# Patient Record
Sex: Male | Born: 1940 | Race: White | Hispanic: No | Marital: Married | Smoking: Never smoker
Health system: Southern US, Community
[De-identification: ages and names within clinical notes are randomized; demographics above are authoritative.]

## PROBLEM LIST (undated history)

## (undated) DIAGNOSIS — J301 Allergic rhinitis due to pollen: Secondary | ICD-10-CM

## (undated) DIAGNOSIS — E785 Hyperlipidemia, unspecified: Secondary | ICD-10-CM

## (undated) DIAGNOSIS — I4510 Unspecified right bundle-branch block: Secondary | ICD-10-CM

## (undated) DIAGNOSIS — M1990 Unspecified osteoarthritis, unspecified site: Secondary | ICD-10-CM

## (undated) DIAGNOSIS — K219 Gastro-esophageal reflux disease without esophagitis: Secondary | ICD-10-CM

## (undated) DIAGNOSIS — C61 Malignant neoplasm of prostate: Secondary | ICD-10-CM

## (undated) HISTORY — DX: Gastro-esophageal reflux disease without esophagitis: K21.9

## (undated) HISTORY — DX: Malignant neoplasm of prostate: C61

## (undated) HISTORY — PX: ROTATOR CUFF REPAIR: SHX139

## (undated) HISTORY — DX: Allergic rhinitis due to pollen: J30.1

## (undated) HISTORY — PX: TONSILLECTOMY: SUR1361

## (undated) HISTORY — DX: Unspecified right bundle-branch block: I45.10

## (undated) HISTORY — DX: Unspecified osteoarthritis, unspecified site: M19.90

## (undated) HISTORY — PX: APPENDECTOMY: SHX54

## (undated) HISTORY — DX: Hyperlipidemia, unspecified: E78.5

## (undated) HISTORY — PX: HERNIA REPAIR: SHX51

## (undated) HISTORY — PX: CARPAL TUNNEL RELEASE: SHX101

---

## 1996-08-21 HISTORY — PX: OTHER SURGICAL HISTORY: SHX169

## 2000-04-13 ENCOUNTER — Encounter: Payer: Self-pay | Admitting: General Surgery

## 2000-04-18 ENCOUNTER — Ambulatory Visit (HOSPITAL_COMMUNITY): Admission: RE | Admit: 2000-04-18 | Discharge: 2000-04-18 | Payer: Self-pay | Admitting: General Surgery

## 2002-07-23 ENCOUNTER — Ambulatory Visit (HOSPITAL_BASED_OUTPATIENT_CLINIC_OR_DEPARTMENT_OTHER): Admission: RE | Admit: 2002-07-23 | Discharge: 2002-07-23 | Payer: Self-pay | Admitting: Orthopedic Surgery

## 2003-08-10 ENCOUNTER — Ambulatory Visit (HOSPITAL_BASED_OUTPATIENT_CLINIC_OR_DEPARTMENT_OTHER): Admission: RE | Admit: 2003-08-10 | Discharge: 2003-08-10 | Payer: Self-pay | Admitting: Orthopedic Surgery

## 2005-08-21 HISTORY — PX: TOTAL HIP ARTHROPLASTY: SHX124

## 2005-10-04 ENCOUNTER — Inpatient Hospital Stay (HOSPITAL_COMMUNITY): Admission: RE | Admit: 2005-10-04 | Discharge: 2005-10-08 | Payer: Self-pay | Admitting: Orthopedic Surgery

## 2005-10-11 ENCOUNTER — Encounter: Admission: RE | Admit: 2005-10-11 | Discharge: 2005-10-11 | Payer: Self-pay | Admitting: Orthopedic Surgery

## 2009-10-19 LAB — CONVERTED CEMR LAB: PSA: 0.01 ng/mL

## 2010-04-28 ENCOUNTER — Ambulatory Visit: Payer: Self-pay | Admitting: Family Medicine

## 2010-04-28 DIAGNOSIS — E785 Hyperlipidemia, unspecified: Secondary | ICD-10-CM | POA: Insufficient documentation

## 2010-04-28 DIAGNOSIS — N5231 Erectile dysfunction following radical prostatectomy: Secondary | ICD-10-CM | POA: Insufficient documentation

## 2010-04-28 DIAGNOSIS — K219 Gastro-esophageal reflux disease without esophagitis: Secondary | ICD-10-CM

## 2010-04-28 DIAGNOSIS — J309 Allergic rhinitis, unspecified: Secondary | ICD-10-CM | POA: Insufficient documentation

## 2010-04-28 LAB — CONVERTED CEMR LAB

## 2010-04-29 LAB — CONVERTED CEMR LAB
ALT: 24 units/L (ref 0–53)
AST: 24 units/L (ref 0–37)
Albumin: 4.4 g/dL (ref 3.5–5.2)
Alkaline Phosphatase: 60 units/L (ref 39–117)
BUN: 16 mg/dL (ref 6–23)
Basophils Absolute: 0 10*3/uL (ref 0.0–0.1)
Basophils Relative: 0.3 % (ref 0.0–3.0)
Bilirubin, Direct: 0.2 mg/dL (ref 0.0–0.3)
CO2: 28 meq/L (ref 19–32)
Calcium: 9.4 mg/dL (ref 8.4–10.5)
Chloride: 103 meq/L (ref 96–112)
Cholesterol: 209 mg/dL — ABNORMAL HIGH (ref 0–200)
Creatinine, Ser: 1.1 mg/dL (ref 0.4–1.5)
Direct LDL: 160.7 mg/dL
Eosinophils Absolute: 0.1 10*3/uL (ref 0.0–0.7)
Eosinophils Relative: 1.1 % (ref 0.0–5.0)
GFR calc non Af Amer: 74.85 mL/min (ref >60)
Glucose, Bld: 86 mg/dL (ref 70–99)
HCT: 44.8 % (ref 39.0–52.0)
HDL: 35.7 mg/dL — ABNORMAL LOW (ref >39.00)
Hemoglobin: 15.6 g/dL (ref 13.0–17.0)
Lymphocytes Relative: 16.3 % (ref 12.0–46.0)
Lymphs Abs: 0.9 10*3/uL (ref 0.7–4.0)
MCHC: 34.8 g/dL (ref 30.0–36.0)
MCV: 90.6 fL (ref 78.0–100.0)
Monocytes Absolute: 0.5 10*3/uL (ref 0.1–1.0)
Monocytes Relative: 9.1 % (ref 3.0–12.0)
Neutro Abs: 4.2 10*3/uL (ref 1.4–7.7)
Neutrophils Relative %: 73.2 % (ref 43.0–77.0)
Platelets: 180 10*3/uL (ref 150.0–400.0)
Potassium: 4.8 meq/L (ref 3.5–5.1)
RBC: 4.94 M/uL (ref 4.22–5.81)
RDW: 14.2 % (ref 11.5–14.6)
Sodium: 140 meq/L (ref 135–145)
Total Bilirubin: 0.9 mg/dL (ref 0.3–1.2)
Total CHOL/HDL Ratio: 6
Total Protein: 7 g/dL (ref 6.0–8.3)
Triglycerides: 87 mg/dL (ref 0.0–149.0)
VLDL: 17.4 mg/dL (ref 0.0–40.0)
WBC: 5.8 10*3/uL (ref 4.5–10.5)

## 2010-07-21 ENCOUNTER — Emergency Department (HOSPITAL_COMMUNITY)
Admission: EM | Admit: 2010-07-21 | Discharge: 2010-07-21 | Payer: Self-pay | Source: Home / Self Care | Admitting: Emergency Medicine

## 2010-08-02 LAB — CONVERTED CEMR LAB
ALT: 34 units/L (ref 0–53)
AST: 27 units/L (ref 0–37)
Cholesterol: 232 mg/dL — ABNORMAL HIGH (ref 0–200)
Direct LDL: 162.7 mg/dL
HDL: 36.4 mg/dL — ABNORMAL LOW (ref >39.00)
Total CHOL/HDL Ratio: 6
Triglycerides: 154 mg/dL — ABNORMAL HIGH (ref 0.0–149.0)
VLDL: 30.8 mg/dL (ref 0.0–40.0)

## 2010-08-03 ENCOUNTER — Ambulatory Visit: Payer: Self-pay | Admitting: Family Medicine

## 2010-08-21 HISTORY — PX: KNEE ARTHROSCOPY: SHX127
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
## 2010-09-22 NOTE — Letter (Signed)
Summary: Patient Questionnaire  Patient Questionnaire   Imported By: Beau Fanny 04/29/2010 10:11:14  _____________________________________________________________________  External Attachment:    Type:   Image     Comment:   External Document

## 2010-09-30 ENCOUNTER — Encounter (INDEPENDENT_AMBULATORY_CARE_PROVIDER_SITE_OTHER): Payer: Medicare Other | Admitting: Family Medicine

## 2010-09-30 ENCOUNTER — Encounter: Payer: Self-pay | Admitting: Family Medicine

## 2010-09-30 DIAGNOSIS — R0789 Other chest pain: Secondary | ICD-10-CM

## 2010-09-30 DIAGNOSIS — I440 Atrioventricular block, first degree: Secondary | ICD-10-CM | POA: Insufficient documentation

## 2010-09-30 DIAGNOSIS — K219 Gastro-esophageal reflux disease without esophagitis: Secondary | ICD-10-CM
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
## 2010-10-07 ENCOUNTER — Ambulatory Visit: Payer: Medicare Other | Admitting: Cardiology

## 2010-10-24 ENCOUNTER — Ambulatory Visit: Payer: Medicare Other | Admitting: Cardiology

## 2010-11-04 ENCOUNTER — Other Ambulatory Visit (HOSPITAL_COMMUNITY): Payer: Self-pay | Admitting: Orthopedic Surgery

## 2010-11-04 ENCOUNTER — Encounter (INDEPENDENT_AMBULATORY_CARE_PROVIDER_SITE_OTHER): Payer: Self-pay | Admitting: *Deleted

## 2010-11-04 DIAGNOSIS — T84030A Mechanical loosening of internal right hip prosthetic joint, initial encounter: Secondary | ICD-10-CM

## 2010-11-08 ENCOUNTER — Telehealth: Payer: Self-pay | Admitting: *Deleted

## 2010-11-08 NOTE — Letter (Signed)
Summary: Appointment - Missed  [facility], Main Office  [address]   [place], [place] [address]   Phone: [phone]  Fax: [phone]     November 04, 2010 MRN: [id number]   [name] [address] [facility] [place], [place]  [address]   Dear Mr. Tucholski,  Our records indicate you missed your appointment on   10-24-2010 with Dr. Daleen Squibb.                                   It is very important that we reach you to reschedule this appointment. We look forward to participating in your health care needs. Please contact us at the number listed above at your earliest convenience to reschedule this appointment.     Sincerely,   Lorne Skeens  [facility] Scheduling Team

## 2010-11-08 NOTE — Telephone Encounter (Signed)
Pt would like zostavax vaccine, please send order to [facility].

## 2010-11-08 NOTE — Telephone Encounter (Signed)
Faxed to [facility] pharmacy

## 2010-11-08 NOTE — Telephone Encounter (Signed)
Prescription in outbox

## 2010-11-10 ENCOUNTER — Ambulatory Visit (HOSPITAL_COMMUNITY)
Admission: RE | Admit: 2010-11-10 | Discharge: 2010-11-10 | Disposition: A | Discharge status: Home / Self Care | Payer: Medicare Other | Source: Ambulatory Visit | Attending: Orthopedic Surgery | Admitting: Orthopedic Surgery

## 2010-11-10 ENCOUNTER — Other Ambulatory Visit (HOSPITAL_COMMUNITY): Payer: Self-pay | Admitting: Orthopedic Surgery

## 2010-11-10 DIAGNOSIS — Z9889 Other specified postprocedural states: Secondary | ICD-10-CM

## 2010-11-10 DIAGNOSIS — Z0189 Encounter for other specified special examinations: Secondary | ICD-10-CM | POA: Insufficient documentation

## 2010-11-10 DIAGNOSIS — M25559 Pain in unspecified hip: Secondary | ICD-10-CM | POA: Insufficient documentation

## 2010-11-10 DIAGNOSIS — K4090 Unilateral inguinal hernia, without obstruction or gangrene, not specified as recurrent: Secondary | ICD-10-CM | POA: Insufficient documentation

## 2010-11-10 DIAGNOSIS — T84030A Mechanical loosening of internal right hip prosthetic joint, initial encounter: Secondary | ICD-10-CM

## 2011-01-06 NOTE — H&P (Signed)
[place]  Patient:    Scott [name], Scott [name]                       MRN: [id number] Adm. Date:  04/18/00 Attending:  Jimmye Norman, M.D.                         History and Physical  DATE OF BIRTH:  [birth year]  SOCIAL SECURITY NUMBER:  [id number]  IDENTIFICATION AND CHIEF COMPLAINT:  The patient is a 70 year old gentleman with a left inguinal hernia who comes in for repair.  HISTORY OF PRESENT ILLNESS:  The patient has had discomfort in his groin for at least the past year.  However, he did not seek medical attention until he developed a bulge which was recently.  He is status post radical prostatectomy for prostate cancer.  However, this is unrelated to that procedure.  PAST MEDICAL HISTORY:  He is otherwise healthy with no real past medical history.  FAMILY HISTORY:  He has a father who died of prostate cancer and the mother died of heart disease.  His brothers and sisters are alive, but both brothers have prostate cancer.  MEDICATIONS:  His only medication includes a stomach pill so to speak for reflux, probably Prevacid or Pravachol.  ALLERGIES:  No known drug allergies.  PHYSICAL EXAMINATION:  GENERAL:  He is well-nourished and well-developed in no acute distress.  IMPRESSION:  He has a large left inguinal hernia without a right inguinal hernia noticeable.  Examination otherwise unremarkable.  PLAN:  Left inguinal hernia repair after preoperative antibiotic.  This will be done as an outpatient very likely unless there are complications of the procedure.  The risks and benefits have been explained to the patient and he wishes to proceed. DD:  04/18/00 TD:  04/18/00 Job: [id number]

## 2011-01-06 NOTE — Op Note (Signed)
NAME:  Scott [name], Scott [name]                        ACCOUNT NO.:  [id number]   MEDICAL RECORD NO.:  [id number]                   PATIENT TYPE:  AMB   LOCATION:  DSC                                  FACILITY:  [facility]   PHYSICIAN:  Feliberto Gottron. Turner Daniels, M.D.                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   PROCEDURE:  Left shoulder arthroscopic anterior inferior acromioplasty,  distal clavicle excision and followed by intra-articular debridement of  chondromalacia of the humeral head and glenoid.   SURGEON:  Feliberto Gottron. Turner Daniels, M.D.   </FIRST ASSISTANT>  Arlys John D. Petrarca, P.A.-C.   ANESTHESIA:  Interscalene block with general endotracheal anesthesia.   ESTIMATED BLOOD LOSS:  Minimal.   FLUIDS REPLACED:  800 mL of crystalloid.   DRAINS PLACED:  None.   TOURNIQUET TIME:  None.   INDICATIONS FOR PROCEDURE:  The patient is a 70 year old gentleman whose  hobbies include weight training.  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  He has a subacromial spur on x-ray and desires elective  decompression of his left shoulder.  He has failed conservative treatment  with anti-inflammatory medications, extensive physical therapy and got good  temporary relief from a cortisone injection.   DESCRIPTION OF PROCEDURE:  The patient was identified by his arm band and  taken to the operating room at the [facility].   Appropriate  anesthetic monitors were attached and interscalene block anesthesia was  induced into the left upper extremity followed by general endotracheal  anesthesia.  The left upper extremity was then prepped and draped in the  usual sterile fashion from the wrist to the hemithorax after placing him in  the beach chair position.  Using a #11 blade, standard portals were then  made 1.5 cm anterior to the [facility] joint, lateral to the junction of the middle  and posterior thirds of the acromion and posterior to the posterolateral  corner of the acromion process.  The In-Flow was placed anteriorly, the  arthroscope laterally and a 4.2 great white sucker shaver posteriorly where  diagnostic arthroscopy revealed a hypertrophic AC joint denuded of  cartilage, a subacromial spur and a relatively intact rotator cuff with no  significant tearing.  Using a 4.5 hooded Vortex bur, we then removed the  subacromial spur and the inferior distal cm of the clavicle partially  debriding  the [facility] joint.  We then brought the 4.5 Vortex bur in anteriorly,  the In-Flow laterally and the scope posteriorly completing a distal clavicle  excision and smoothing off the end of the clavicle.  Satisfied with the  decompression, the shoulder was then washed out with normal saline solution  and with the In-Flow attached to the scope, the scope was repositioned into  the glenohumeral joint using the posterior portal where diagnostic  arthroscopy revealed grade 4 focal chondromalacia of the humeral head which  was debrided as well as removing small bits of articular cartilage found  floating in the joint fluid.  Minimal glenoid tearing was also removed and  grade 3 chondromalacia of the glenoid articular cartilage was also debrided.  The shoulder was once again washed out with normal saline solution via the  scope.  The instruments were then removed and a dressing of Xeroform, 4 x 4  dressing sponges and paper tape applied  followed by a sling.  The patient  was awakened and taken to the recovery room without difficulty.                                               Feliberto Gottron. Turner Daniels, M.D.    [name]  D:  08/10/2003  T:  08/10/2003  Job:  [id number]

## 2011-01-06 NOTE — Op Note (Signed)
NAME:  NICHOLOS, ALOISI                        ACCOUNT NO.:  [id number]   MEDICAL RECORD NO.:  [id number]                   PATIENT TYPE:  AMB   LOCATION:  DSC                                  FACILITY:  [facility]   PHYSICIAN:  Feliberto Gottron. Turner Daniels, M.D.                DATE OF BIRTH:  [birth year]   DATE OF PROCEDURE:  DATE OF DISCHARGE:                                 OPERATIVE REPORT   NO DICTATION.                                               Feliberto Gottron. Turner Daniels, M.D.    [name]  D:  08/10/2003  T:  08/11/2003  Job:  [id number]

## 2011-01-06 NOTE — Op Note (Signed)
[place]  Patient:    Scott [name], Scott [name]                     MRN: [id number] Proc. Date: 04/18/00 Adm. Date:  [id number] Disc. Date: [id number] Attending:  Cherylynn Ridges                           Operative Report  PREOPERATIVE DIAGNOSIS:  Left inguinal hernia.  POSTOPERATIVE DIAGNOSIS:  Left indirect inguinal hernia with small direct weakness.  OPERATION PERFORMED:  Left inguinal herniorrhaphy with Marlex mesh.  SURGEON:  Jimmye Norman, M.D.  ASSISTANT:  None.  ANESTHESIA:  General with a laryngeal airway.  ESTIMATED BLOOD LOSS:  50 cc.  COMPLICATIONS:  None.  CONDITION:  Stable.  INDICATIONS FOR PROCEDURE:  The patient is a 70 year old gentleman status post a radical prostatectomy, who comes in with a left inguinal bulge.  He was checked for a right inguinal hernia at the office visit, did not appear to have one, although he does complain of some mild tenderness on that side, he does not have an obvious hernia.  FINDINGS:  The patient had an indirect hernia associated with a gubernaculum which was ligated.  The vas deferens was normal and anatomy otherwise within normal limits.  He did have mild weakness of the direct floor for which we placed a Marlex mesh.  DESCRIPTION OF PROCEDURE:  The patient was taken to the operating room and placed on the table in supine position.  After an adequate laryngeal airway anesthetic was administered, he was prepped and draped in the usual sterile manner exposing the left groin.  A 5 cm long transverse curvilinear incision was made at the level of the superficial ring, this taken down through the subcutaneous down through the external oblique fascia.  With a Weitlaner retractor in place, we opened the fascia down through the superficial ring and then isolated the spermatic cord around a Penrose.  We were able to dissect off part of the cremasteric muscle, isolating it from the floor of the inguinal  canal.  We were able to dissect out the hernia sac from the rest of the spermatic cord and also preserve the vas deferens.  There was a gubernaculum associated with the hernia sac which was ligated with a Kelly clamp and 2 and 3-0 Vicryl tie.  Again this was a gubernaculum, was not spermatic cord.  We checked them both before and after ligation and the spermatic cord was completely intact.  We resected the sac down to its base and then ligated it doubly with a 0 suture ligature of Ethibone.  Once this was done, it retracted back into the perperitoneal area and then we repaired the floor using a running 0 Prolene attached to a piece of Marlex mesh attached anteromedially to the conjoined tendon and inferolaterally to the reflected portion of the inguinal ligament.  All needle counts were correct.  At that point, we irrigated with antibiotic solution as we soaked the Marlex in prior to implantation.  Once we irrigated, we placed the cord back into the canal.  We repaired the external oblique fascia on top of the cord using a running 3-0 Vicryl.  Scarpas fascia was reapproximated using interrupted sutures of 3-0 Vicryl.  0.25% Marcaine was injected into the incision using syringe and a 25 gauge needle.  Then we closed the skin using a running subcuticular stitch of 5-0 Vicryl.  All  sponge, needle and instrument counts were correct.  A sterile dressing was applied. DD:  04/18/00 TD:  04/19/00 Job: [id number]
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
## 2011-01-06 NOTE — Op Note (Signed)
NAME:  MUAD, NOGA                        ACCOUNT NO.:  [id number]   MEDICAL RECORD NO.:  [id number]                   PATIENT TYPE:  AMB   LOCATION:  DSC                                  FACILITY:  [facility]   PHYSICIAN:  Feliberto Gottron. Turner Daniels, M.D.                DATE OF BIRTH:  [birth year]   DATE OF PROCEDURE:  07/23/2002  DATE OF DISCHARGE:                                 OPERATIVE REPORT   PREOPERATIVE DIAGNOSES:  Right shoulder impingement with supraspinatus  rotator cuff tear about a month old.   POSTOPERATIVE DIAGNOSES:  Right shoulder impingement with supraspinatus  rotator cuff tear about a month old.   OPERATION PERFORMED:  Right shoulder arthroscopic anterior inferior  acromioplasty, co-plane of distal clavicular spur followed by mini open  rotator cuff repair using three of the 4 mm Revo screws.   SURGEON:  Feliberto Gottron. Turner Daniels, M.D.   ASSISTANTLaural Benes. Jannet Mantis.   ANESTHESIA:  Interscalene block plus general endotracheal.   ESTIMATED BLOOD LOSS:  Minimal.   FLUIDS REPLACED:  1L of crystalloid.   DRAINS:  None.   TOURNIQUET TIME:  None.   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  MRI scan proved the rotator cuff rupture.  He is taken for  arthroscopic decompression with removal of 1 cm type 3 subacromial spur, a  subclavicular spur followed by an anticipated mini open rotator cuff repair  using the Revo screw technique.  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  He is an avid weight trainer and  is adamant about having his cuff repaired.   DESCRIPTION OF PROCEDURE:  The patient was identified by arm band and taken  to the operating room at [facility] where appropriate  anesthetic monitors are attached and interscalene block anesthesia induced  to the right upper extremity followed by  general endotracheal anesthesia.  The patient was placed in the beach chair position.  The right upper  extremity was prepped and draped in the usual sterile fashion from the wrist  to the hemithorax.  Using a #11 blade, standard anterolateral and posterior  portals were made 1.5 cm anterior to the acromioclavicular joint, lateral to  the junction of the posterior and middle thirds of the acromion, posterior  to the posterolateral corner of the acromion process.  The inflow was placed  anteriorly, the arthroscope laterally, and a 4.2 great white sucker shaver  posteriorly.  We performed subacromial bursectomy, identified subclavicular  and subacromial spurs as well as a full thickness supraspinatus cuff tear  retracted about 1.5 cm.  The patient had a significant amount of oozing from  the soft tissues.  Underwater electrocautery was used to control this and  then a 4.5  mm hooded Vortex bur was used to perform a distal clavicle  inferior spur excision and anterior inferior acromioplasty.  We then  carefully examined the rotator cuff that remained.  It was good quality  tissue at least 1/4 inch thick and at this point we converted to a mini open  technique making a 4 cm incision starting at the anterolateral corner of the  acromion and going distally through the skin and subcutaneous tissue.  The  deltoid muscle was split in line with the skin incision along the  anterolateral raphe allowing [place] to enter the subdeltoid and subacromial  space.  We immediately identified the rotator cuff tear as well as the  anterior tubercle of the greater tuberosity where we placed three 4 mm Revo  screws with a #2 Ethibond suture.  Using a free needle, this was then passed  through the supraspinatus cuff obtaining good purchase on the tissue and  then in order, the sutures were tied, performing a rotator cuff repair and  using a minimum of 6 knots in the suture to assure good firm fixation.  The  wound was  then washed out with normal saline.  The deltoid interval  anterolateral raphe was closed with running 2-0 Vicryl suture, the  subcutaneous tissue with 2-0 Vicryl suture and the skin with running  interlocking 3-0 nylon suture.  A dressing of Xeroform, 4 x 4 dressing  sponges and HypaFix tape applied followed by a shoulder immobilizer.  Patient awakened and taken to the recovery room without difficulty.                                                  Feliberto Gottron. Turner Daniels, M.D.    [name]  D:  07/23/2002  T:  07/23/2002  Job:  [id number]

## 2011-06-12 ENCOUNTER — Encounter: Payer: Self-pay | Admitting: Family Medicine

## 2011-06-13 ENCOUNTER — Encounter: Payer: Self-pay | Admitting: Family Medicine

## 2011-06-13 ENCOUNTER — Ambulatory Visit (INDEPENDENT_AMBULATORY_CARE_PROVIDER_SITE_OTHER): Payer: Medicare Other | Admitting: Family Medicine

## 2011-06-13 VITALS — BP 130/80 | HR 75 | Temp 97.6°F | Ht 69.5 in | Wt 176.8 lb

## 2011-06-13 DIAGNOSIS — Z23 Encounter for immunization: Secondary | ICD-10-CM

## 2011-06-13 DIAGNOSIS — B356 Tinea cruris: Secondary | ICD-10-CM

## 2011-06-13 MED ORDER — TERBINAFINE HCL 1 % EX CREA: TOPICAL_CREAM | Freq: Two times a day (BID) | CUTANEOUS | Status: DC

## 2011-06-13 NOTE — Progress Notes (Signed)
  Subjective:    Patient ID: Scott [name], male    DOB: Sep 19, 1940, 70 y.o.   MRN: [id number]  HPI  Scott [name], a 70 y.o. male presents today in the office for the following:    Hydrocortisone 1% and - Iodoquinol 1%  Rash off and on in groin, has used above. Sometimes help / sometimes not.  The PMH, PSH, Social History, Family History, Medications, and allergies have been reviewed in [facility], and have been updated if relevant.   Review of Systems ROS: GEN: No acute illnesses, no fevers, chills. Interactive and getting along well at home.  Otherwise, ROS is as per the HPI.     Objective:   Physical Exam   Physical Exam  Blood pressure 130/80, pulse 75, temperature 97.6 F (36.4 C), temperature source Oral, height 5' 9.5" (1.765 m), weight 176 lb 12.8 oz (80.196 kg), SpO2 97.00%.  GEN: WDWN, NAD, Non-toxic, A & O x 3 HEENT: Atraumatic, Normocephalic. Neck supple. No masses, No LAD. Ears and Nose: No external deformity. EXTR: No c/c/e NEURO Normal gait.  PSYCH: Normally interactive. Conversant. Not depressed or anxious appearing.  Calm demeanor.  Skin: elevated rash with borders, some irritation, redness      Assessment & Plan:   1. Jock itch  terbinafine (LAMISIL) 1 % cream  2. Flu vaccine need  Flu vaccine greater than or equal to 3yo preservative free IM    Classic appearance

## 2011-06-13 NOTE — Patient Instructions (Signed)
Apply twice a day -- may take up to 6 weeks

## 2011-11-13 ENCOUNTER — Ambulatory Visit (INDEPENDENT_AMBULATORY_CARE_PROVIDER_SITE_OTHER): Payer: Medicare Other | Admitting: Family Medicine

## 2011-11-13 ENCOUNTER — Encounter: Payer: Self-pay | Admitting: Family Medicine

## 2011-11-13 DIAGNOSIS — N400 Enlarged prostate without lower urinary tract symptoms: Secondary | ICD-10-CM | POA: Diagnosis not present

## 2011-11-13 DIAGNOSIS — Z79899 Other long term (current) drug therapy: Secondary | ICD-10-CM

## 2011-11-13 DIAGNOSIS — R000 Tachycardia, unspecified: Secondary | ICD-10-CM

## 2011-11-13 DIAGNOSIS — E785 Hyperlipidemia, unspecified: Secondary | ICD-10-CM | POA: Diagnosis not present

## 2011-11-13 DIAGNOSIS — R079 Chest pain, unspecified: Secondary | ICD-10-CM | POA: Diagnosis not present

## 2011-11-13 LAB — CBC WITH DIFFERENTIAL/PLATELET
Basophils Relative: 0 % (ref 0.0–3.0)
Eosinophils Absolute: 0 10*3/uL (ref 0.0–0.7)
Eosinophils Relative: 0.2 % (ref 0.0–5.0)
Hemoglobin: 15.5 g/dL (ref 13.0–17.0)
Lymphs Abs: 0.2 10*3/uL — ABNORMAL LOW (ref 0.7–4.0)
Monocytes Absolute: 0.3 10*3/uL (ref 0.1–1.0)
Neutro Abs: 6.2 10*3/uL (ref 1.4–7.7)
Neutrophils Relative %: 91.5 % — ABNORMAL HIGH (ref 43.0–77.0)
RDW: 14.2 % (ref 11.5–14.6)
WBC: 6.8 10*3/uL (ref 4.5–10.5)

## 2011-11-13 LAB — BASIC METABOLIC PANEL
BUN: 23 mg/dL (ref 6–23)
Calcium: 9.2 mg/dL (ref 8.4–10.5)
GFR: 82.62 mL/min (ref >60.00)
Glucose, Bld: 114 mg/dL — ABNORMAL HIGH (ref 70–99)
Sodium: 141 mEq/L (ref 135–145)

## 2011-11-13 LAB — LIPID PANEL
Cholesterol: 185 mg/dL (ref 0–200)
LDL Cholesterol: 131 mg/dL — ABNORMAL HIGH (ref 0–99)
Total CHOL/HDL Ratio: 5
Triglycerides: 88 mg/dL (ref 0.0–149.0)
VLDL: 17.6 mg/dL (ref 0.0–40.0)

## 2011-11-13 LAB — HEPATIC FUNCTION PANEL
ALT: 25 U/L (ref 0–53)
Bilirubin, Direct: 0.1 mg/dL (ref 0.0–0.3)
Total Bilirubin: 0.7 mg/dL (ref 0.3–1.2)

## 2011-11-13 NOTE — Progress Notes (Signed)
Patient Name: Scott [name] Date of Birth: 1940-11-16 Medical Record Number: [id number] Gender: male Date of Encounter: 11/13/2011  History of Present Illness:  Scott [name] is a 71 y.o. very pleasant male patient who presents with the following:  Chest pain -- ate some popcorn, has not been eating since. Has been up all night and took 2 excedrin. Patient began to get some chest pain and tightness while he was eating some popcorn last night. Since that time he has had a belching acidic taste in his mouth and continued to have some mild discomfort in his chest, but that has improved significantly compared to last night.  Feels like the food up in the chest, tightness in the chest and everything -- 7:30 PM. No nausea.  Left shoulder pain, pain has been there for about a month. He has some occasional hand tingling, but that has been ongoing for greater than a month About a month with some tingling in his fingers.  Lab Results  Component Value Date   CHOL 185 11/13/2011   CHOL 232* 08/02/2010   CHOL 209* 04/28/2010   Lab Results  Component Value Date   HDL 36.60* 11/13/2011   HDL 36.40* 08/02/2010   HDL 35.70* 04/28/2010   Lab Results  Component Value Date   LDLCALC 131* 11/13/2011   Lab Results  Component Value Date   TRIG 88.0 11/13/2011   TRIG 154.0* 08/02/2010   TRIG 87.0 04/28/2010   Lab Results  Component Value Date   CHOLHDL 5 11/13/2011   CHOLHDL 6 08/02/2010   CHOLHDL 6 04/28/2010   Lab Results  Component Value Date   LDLDIRECT 162.7 08/02/2010   LDLDIRECT 160.7 04/28/2010   Cardiac risk factors: No Tob, ETOH, Drugs Previously with elevated Chol Elevated BP today without dx of HTN Father, CAD, MI died in 24's Mother, died in 15's of MI  Had ETT but > 10 years ago  The patient is physically active and works out. He does describe having tachycardia greater than 200 after less than 5 minutes of exercise  Patient Active Problem List  Diagnoses  . HYPERLIPIDEMIA   . ALLERGIC RHINITIS  . GERD  . ORGANIC IMPOTENCE  . AV BLOCK, 1ST DEGREE  . CHEST PAIN, ATYPICAL   Past Medical History  Diagnosis Date  . Allergy   . GERD (gastroesophageal reflux disease)   . Hyperlipidemia   . Arthritis   . Prostate cancer     cancer   Past Surgical History  Procedure Date  . Hernia repair   . Prostatectomy   . Appendectomy   . Tonsillectomy   . Total hip arthroplasty     right   . Rotator cuff repair     right   History  Substance Use Topics  . Smoking status: Never Smoker   . Smokeless tobacco: Not on file  . Alcohol Use: No   Family History  Problem Relation Age of Onset  . Cancer Father     prostate  . Cancer Brother     prostate  . Cancer Brother     prostate   No Known Allergies  Medication list has been reviewed and updated.  Review of Systems: As above. No shortness of breath. No syncope. occ tachycardia  Physical Examination: Filed Vitals:   11/13/11 1232  BP: 140/70  Pulse: 95  Temp: 97.9 F (36.6 C)  TempSrc: Oral  Height: 5' 9.5" (1.765 m)  Weight: 181 lb 1.9 oz (82.155 kg)  SpO2: 96%  Body mass index is 26.36 kg/(m^2).   GEN: WDWN, NAD, Non-toxic, A & O x 3 HEENT: Atraumatic, Normocephalic. Neck supple. No masses, No LAD. Ears and Nose: No external deformity. CV: RRR, pulse 100. No M/G/R. No JVD. No thrill. No extra heart sounds. PULM: CTA B, no wheezes, crackles, rhonchi. No retractions. No resp. distress. No accessory muscle use. EXTR: No c/c/e NEURO Normal gait.  PSYCH: Normally interactive. Conversant. Not depressed or anxious appearing.  Calm demeanor.    Assessment and Plan:  1. Chest pain  EKG 12-Lead, Ambulatory referral to Cardiology, Basic metabolic panel, CBC with Differential  2. Tachycardia  Ambulatory referral to Cardiology, Basic metabolic panel, CBC with Differential  3. Hyperlipidemia  Hepatic function panel, Lipid panel  4. Encounter for long-term (current) use of other medications   CBC with Differential, Hepatic function panel  5. Enlarged prostate  PSA   EKG: NSR. Pulse 95. RBBB. 1st degree AV block. These are not new and are apparent on a prior electrocardiogram from August, 2012. The patient also has some prior flipped T waves on some inferior leads, which are stable compared to his prior electrocardiogram in August of 2012. No ST depression or elevation. Inverted T. On v2, different from prior.  Historically, this sounds more to be reflux and GI induced. I did have him do some Maalox 4 times daily and continue his protonix.  Given his electrocardiogram findings, chest pain and risk factors of family history and prior elevated cholesterols, and a history of tachycardia greater than 200 with minimal exertion, we'll consult cardiology for their opinion. I appreciate their assistance.  Orders Today: Orders Placed This Encounter  Procedures  . Basic metabolic panel  . CBC with Differential  . Hepatic function panel  . Lipid panel  . PSA  . Ambulatory referral to Cardiology    Referral Priority:  Routine    Referral Type:  Consultation    Referral Reason:  Specialty Services Required    Requested Specialty:  Cardiology    Number of Visits Requested:  1  . EKG 12-Lead

## 2011-11-13 NOTE — Patient Instructions (Signed)
REFERRAL: GO THE THE FRONT ROOM AT THE ENTRANCE OF OUR CLINIC, NEAR CHECK IN. ASK FOR Scott [name]. SHE WILL HELP YOU SET UP YOUR REFERRAL. DATE: TIME:  

## 2011-11-21 ENCOUNTER — Ambulatory Visit (INDEPENDENT_AMBULATORY_CARE_PROVIDER_SITE_OTHER): Payer: Medicare Other | Admitting: Cardiovascular Disease

## 2011-11-21 ENCOUNTER — Encounter: Payer: Self-pay | Admitting: Cardiovascular Disease

## 2011-11-21 DIAGNOSIS — R000 Tachycardia, unspecified: Secondary | ICD-10-CM

## 2011-11-21 DIAGNOSIS — R0789 Other chest pain: Secondary | ICD-10-CM | POA: Diagnosis not present

## 2011-11-21 DIAGNOSIS — R079 Chest pain, unspecified: Secondary | ICD-10-CM | POA: Diagnosis not present

## 2011-11-21 NOTE — Progress Notes (Signed)
HPI  Scott [name] is a pleasant 71 year old male who is referred by Dr. Patsy Lager for evaluation of recent chest pain and an abnormal ECG. The patient is not aware of any previous cardiac history. His only risk factors for coronary artery disease include age and hyperlipidemia. He is physically very active and exercises on a regular basis mostly weight lifting with occasional cardiac aerobic exercise. 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 He had an episode of chest pain about one week ago after he ate popcorn. It was described as an aching sensation with some heartburn. It happened at rest and did not worsen with physical activities. He had no dyspnea associated with that. He has been able to do all his exercise without limitations. However, he mentions that when he exercises on the elliptical his heart rate goes as high as 190-200 beats per minute. That's usually after at least 30-45 minutes of intense exercise. At that workload, he denies any chest pain but he does get dyspnea. He has not had any tachycardic episode when he is not exercising.   No Known Allergies   Current Outpatient Prescriptions on File Prior to Visit  Medication Sig Dispense Refill  . aspirin 81 MG tablet Take 81 mg by mouth daily.        . celecoxib (CELEBREX) 200 MG capsule Take 200 mg by mouth daily.        . pantoprazole (PROTONIX) 40 MG tablet Take 40 mg by mouth daily.        . Red Yeast Rice 600 MG CAPS Take 1 capsule by mouth daily.        . tadalafil (CIALIS) 20 MG tablet Take 20 mg by mouth daily as needed.        . terbinafine (LAMISIL) 1 % cream Apply topically 2 (two) times daily.  30 g  2  . vitamin B-12 (CYANOCOBALAMIN) 1000 MCG tablet Take 1,000 mcg by mouth daily.           Past Medical History  Diagnosis Date  . Allergy   . GERD (gastroesophageal reflux disease)   . Hyperlipidemia   . Arthritis   . Prostate cancer     cancer     Past Surgical History  Procedure Date  . Hernia  repair   . Prostatectomy   . Appendectomy   . Tonsillectomy   . Total hip arthroplasty     right   . Rotator cuff repair     right     Family History  Problem Relation Age of Onset  . Cancer Father     prostate  . Cancer Brother     prostate  . Cancer Brother     prostate     History   Social History  . Marital Status: Married    Spouse Name: N/A    Number of Children: N/A  . Years of Education: N/A   Occupational History  . auctioneer    Social History Main Topics  . Smoking status: Never Smoker   . Smokeless tobacco: Not on file  . Alcohol Use: No  . Drug Use: No  . Sexually Active: Not on file   Other Topics Concern  . 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  HENT: Negative for hearing loss, nosebleeds, congestion,  sore throat, facial swelling, drooling, trouble swallowing, neck pain, voice change, sinus pressure and tinnitus.  Eyes: Negative for photophobia, pain, discharge and visual disturbance.  Respiratory: Negative for apnea, cough and wheezing.  Cardiovascular: Negative for  leg swelling.  Gastrointestinal: Negative for nausea, vomiting, abdominal pain, diarrhea, constipation, blood in stool and abdominal distention.  Genitourinary: Negative for dysuria, urgency, frequency, hematuria and decreased urine volume.  Musculoskeletal: Negative for myalgias, back pain, joint swelling, arthralgias and gait problem.  Skin: Negative for color change, pallor, rash and wound.  Neurological: Negative for dizziness, tremors, seizures, syncope, speech difficulty, weakness, light-headedness, numbness and headaches.  Psychiatric/Behavioral: Negative for suicidal ideas, hallucinations, behavioral problems and agitation. The patient is not  nervous/anxious.     PHYSICAL EXAM   BP 136/74  Pulse 89  Ht 5' 9.5" (1.765 m)  Wt 178 lb (80.74 kg)  BMI 25.91 kg/m2  SpO2 100%  Constitutional: He is oriented to person, place, and time. He appears well-developed and well-nourished. No distress.  HENT: No nasal discharge.  Head: Normocephalic and atraumatic.  Eyes: Pupils are equal and round. Right eye exhibits no discharge. Left eye exhibits no discharge.  Neck: Normal range of motion. Neck supple. No JVD present. No thyromegaly present.  Cardiovascular: Normal rate, regular rhythm, normal heart sounds and. Exam reveals no gallop and no friction rub. No murmur heard.  Pulmonary/Chest: Effort normal and breath sounds normal. No stridor. No respiratory distress. He has no wheezes. He has no rales. He exhibits no tenderness.  Abdominal: Soft. Bowel sounds are normal. He exhibits no distension. There is no tenderness. There is no rebound and no guarding.  Musculoskeletal: Normal range of motion. He exhibits no edema and no tenderness.  Neurological: He is alert and oriented to person, place, and time. Coordination normal.  Skin: Skin is warm and dry. No rash noted. He is not diaphoretic. No erythema. No pallor.  Psychiatric: He has a normal mood and affect. His behavior is normal. Judgment and thought content normal.      EKG: His recent ECG was reviewed which showed normal sinus rhythm with first degree AV block. There was a right bundle branch block and left anterior fascicular block.   ASSESSMENT AND PLAN

## 2011-11-21 NOTE — Assessment & Plan Note (Signed)
The patient seems to have an exaggerated heart rate response to exercise based on his description. His heart rate occasionally goes up as high as 200 beats per minute. Based on his age, I recommend trying to maintain his peak heart rate less than 152 beats per minute which is the maximum predicted heart rate for his age. His heart rate response to exercise will be evaluated during his stress test. He is asymptomatic and certainly I don't recommend treatment with a beta blocker or calcium blocker especially in the setting of conduction abnormalities on his EKG.

## 2011-11-21 NOTE — Patient Instructions (Addendum)
Need to have a Treadmill Myoview at [facility] on December 05, 2011 arrive 7:30 am.  You may take all your medications with water the am of procedure. Do not eat or drink anything after midnight the night before your procedure.  You may follow up with Dr. Kirke Corin as needed.

## 2011-11-21 NOTE — Assessment & Plan Note (Signed)
The patient had a recent episode of chest pain which likely was GI in nature. However, he has an abnormal ECG and risk factors for coronary artery disease. Due to all of that I recommend proceeding with a treadmill nuclear stress test for further evaluation. A treadmill stress test alone would not be sufficient due to his abnormal baseline ECG. If the stress test comes back normal, no further cardiac testing is indicated. He can followup with [place] as needed.

## 2011-11-23 ENCOUNTER — Encounter: Payer: Self-pay | Admitting: Cardiovascular Disease

## 2011-11-30 ENCOUNTER — Institutional Professional Consult (permissible substitution): Payer: Medicare Other | Admitting: Cardiovascular Disease

## 2011-12-05 ENCOUNTER — Ambulatory Visit: Payer: Self-pay | Admitting: Cardiovascular Disease

## 2011-12-05 DIAGNOSIS — R079 Chest pain, unspecified: Secondary | ICD-10-CM | POA: Diagnosis not present

## 2012-01-02 DIAGNOSIS — M25559 Pain in unspecified hip: Secondary | ICD-10-CM | POA: Diagnosis not present

## 2012-01-03 ENCOUNTER — Other Ambulatory Visit (HOSPITAL_COMMUNITY): Payer: Self-pay | Admitting: Orthopedic Surgery

## 2012-01-03 DIAGNOSIS — T84038A Mechanical loosening of other internal prosthetic joint, initial encounter: Secondary | ICD-10-CM

## 2012-01-03 DIAGNOSIS — Z96649 Presence of unspecified artificial hip joint: Secondary | ICD-10-CM

## 2012-01-08 ENCOUNTER — Ambulatory Visit (HOSPITAL_COMMUNITY)
Admission: RE | Admit: 2012-01-08 | Discharge: 2012-01-08 | Disposition: A | Discharge status: Home / Self Care | Payer: Medicare Other | Source: Ambulatory Visit | Attending: Orthopedic Surgery | Admitting: Orthopedic Surgery

## 2012-01-08 ENCOUNTER — Inpatient Hospital Stay (HOSPITAL_COMMUNITY): Admission: RE | Admit: 2012-01-08 | Payer: Medicare Other | Source: Ambulatory Visit

## 2012-01-08 DIAGNOSIS — M25559 Pain in unspecified hip: Secondary | ICD-10-CM | POA: Insufficient documentation

## 2012-01-08 DIAGNOSIS — T84038A Mechanical loosening of other internal prosthetic joint, initial encounter: Secondary | ICD-10-CM

## 2012-01-08 DIAGNOSIS — Z96649 Presence of unspecified artificial hip joint: Secondary | ICD-10-CM | POA: Insufficient documentation

## 2012-01-08 DIAGNOSIS — M169 Osteoarthritis of hip, unspecified: Secondary | ICD-10-CM | POA: Diagnosis not present

## 2012-05-13 DIAGNOSIS — H43399 Other vitreous opacities, unspecified eye: Secondary | ICD-10-CM | POA: Diagnosis not present

## 2012-05-13 DIAGNOSIS — H40019 Open angle with borderline findings, low risk, unspecified eye: Secondary | ICD-10-CM | POA: Diagnosis not present

## 2012-05-13 DIAGNOSIS — T1510XA Foreign body in conjunctival sac, unspecified eye, initial encounter: Secondary | ICD-10-CM | POA: Diagnosis not present

## 2012-05-13 DIAGNOSIS — Z961 Presence of intraocular lens: Secondary | ICD-10-CM | POA: Diagnosis not present

## 2012-05-21 DIAGNOSIS — M25559 Pain in unspecified hip: Secondary | ICD-10-CM | POA: Diagnosis not present

## 2012-06-11 ENCOUNTER — Encounter (INDEPENDENT_AMBULATORY_CARE_PROVIDER_SITE_OTHER): Payer: Self-pay | Admitting: General Surgery

## 2012-06-20 DIAGNOSIS — H40019 Open angle with borderline findings, low risk, unspecified eye: Secondary | ICD-10-CM | POA: Diagnosis not present

## 2012-07-17 DIAGNOSIS — M25559 Pain in unspecified hip: Secondary | ICD-10-CM | POA: Diagnosis not present

## 2012-07-20 ENCOUNTER — Emergency Department: Payer: Self-pay | Admitting: Emergency Medicine

## 2012-07-20 DIAGNOSIS — R209 Unspecified disturbances of skin sensation: Secondary | ICD-10-CM | POA: Diagnosis not present

## 2012-07-20 DIAGNOSIS — M65849 Other synovitis and tenosynovitis, unspecified hand: Secondary | ICD-10-CM | POA: Diagnosis not present

## 2012-07-20 DIAGNOSIS — S63509A Unspecified sprain of unspecified wrist, initial encounter: Secondary | ICD-10-CM | POA: Diagnosis not present

## 2012-07-20 DIAGNOSIS — M65839 Other synovitis and tenosynovitis, unspecified forearm: Secondary | ICD-10-CM | POA: Diagnosis not present

## 2012-07-20 DIAGNOSIS — M779 Enthesopathy, unspecified: Secondary | ICD-10-CM | POA: Diagnosis not present

## 2012-07-23 DIAGNOSIS — Z23 Encounter for immunization: Secondary | ICD-10-CM | POA: Diagnosis not present

## 2012-07-24 DIAGNOSIS — M25559 Pain in unspecified hip: Secondary | ICD-10-CM | POA: Diagnosis not present

## 2012-07-24 DIAGNOSIS — M19049 Primary osteoarthritis, unspecified hand: Secondary | ICD-10-CM | POA: Diagnosis not present

## 2012-08-01 DIAGNOSIS — M19049 Primary osteoarthritis, unspecified hand: Secondary | ICD-10-CM | POA: Diagnosis not present

## 2012-08-01 DIAGNOSIS — M720 Palmar fascial fibromatosis [Dupuytren]: Secondary | ICD-10-CM | POA: Diagnosis not present

## 2012-10-02 ENCOUNTER — Other Ambulatory Visit: Payer: Self-pay | Admitting: *Deleted

## 2012-10-02 MED ORDER — PANTOPRAZOLE SODIUM 40 MG PO TBEC: 40.0000 mg | DELAYED_RELEASE_TABLET | Freq: Every day | ORAL | Status: DC

## 2012-11-06 DIAGNOSIS — M25559 Pain in unspecified hip: Secondary | ICD-10-CM | POA: Diagnosis not present

## 2013-01-07 DIAGNOSIS — M67919 Unspecified disorder of synovium and tendon, unspecified shoulder: Secondary | ICD-10-CM | POA: Diagnosis not present

## 2013-01-07 DIAGNOSIS — M719 Bursopathy, unspecified: Secondary | ICD-10-CM | POA: Diagnosis not present

## 2013-01-15 DIAGNOSIS — M719 Bursopathy, unspecified: Secondary | ICD-10-CM | POA: Diagnosis not present

## 2013-01-15 DIAGNOSIS — M67919 Unspecified disorder of synovium and tendon, unspecified shoulder: Secondary | ICD-10-CM | POA: Diagnosis not present

## 2013-01-15 DIAGNOSIS — M19019 Primary osteoarthritis, unspecified shoulder: Secondary | ICD-10-CM | POA: Diagnosis not present

## 2013-01-22 DIAGNOSIS — M25519 Pain in unspecified shoulder: Secondary | ICD-10-CM | POA: Diagnosis not present

## 2013-01-22 NOTE — H&P (Signed)
Scott [name] is an 72 y.o. male.   Chief Complaint: Right shoulder pain HPI: Patient reports some continued pain in his right shoulder, especially when he is at the gym and doing bench presses.  The cortisone injection that we gave him on 01/07/2013 did give him some temporary relief for a few days but one he is working out the pain has returned.  This is the same shoulder that had a mini open rotator cuff repair by me 12 years ago, using Revo screws and did very well until this year.  He does recall any specific trauma.  His plain x-rays do show a drop osteophyte off the humeral head and loss of one half of the articular cartilage on the axillary lateral view.  There is crepitus when he takes it through a range of motion.  He also noted a lump over the right sternoclavicular joint.  It is not particularly tender he noticed it last week but it may have been there a much longer period of time.  Lump over the sternoclavicular joint is not particularly warm or erythematous.    Past Medical History  Diagnosis Date  . Allergy   . GERD (gastroesophageal reflux disease)   . Hyperlipidemia   . Arthritis   . Prostate cancer     cancer    Past Surgical History  Procedure Laterality Date  . Hernia repair    . Prostatectomy    . Appendectomy    . Tonsillectomy    . Total hip arthroplasty      right   . Rotator cuff repair      right    Family History  Problem Relation Age of Onset  . Cancer Father     prostate  . Cancer Brother     prostate  . Cancer Brother     prostate   Social History:  reports that he has never smoked. He does not have any smokeless tobacco history on file. He reports that he does not drink alcohol or use illicit drugs.  Allergies: No Known Allergies  No prescriptions prior to admission    No results found for this or any previous visit (from the past 48 hour(s)). No results found.  Review of Systems  Constitutional: Negative.   HENT: Negative.   Eyes:  Negative.   Respiratory: Negative.   Cardiovascular: Negative.   Gastrointestinal: Negative.   Genitourinary: Negative.   Musculoskeletal: Positive for joint pain.  Skin: Negative.   Neurological: Negative.   Endo/Heme/Allergies: Negative.   Psychiatric/Behavioral: Negative.     There were no vitals taken for this visit. Physical Exam  Constitutional: He is oriented to person, place, and time. He appears well-developed and well-nourished.  HENT:  Head: Normocephalic.  Eyes: Pupils are equal, round, and reactive to light.  Cardiovascular: Intact distal pulses.   Respiratory: Effort normal.  Musculoskeletal:       Right shoulder: He exhibits decreased range of motion and crepitus.  Neurological: He is alert and oriented to person, place, and time.  Psychiatric: He has a normal mood and affect.     Assessment/Plan Assess: 1.  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 2.  Right shoulder rotator cuff irritation with moderate arthritis and a 72 year old man is a heavy weightlifter.  Plan: We will proceed with arthroscopic evaluation of the shoulder next week, I anticipate removing loose bodies from the joint and debriding flap tears from the articular cartilage, as well as doing a decompression of any bone spurs.  I reassured him that the anterior right sternoclavicular subluxation is generally benign and requires no surgical intervention.  Risks and benefits of surgery were discussed.  Scott [name] M. 01/22/2013, 9:56 AM

## 2013-01-24 ENCOUNTER — Encounter (HOSPITAL_BASED_OUTPATIENT_CLINIC_OR_DEPARTMENT_OTHER): Payer: Self-pay | Admitting: *Deleted

## 2013-01-24 NOTE — Progress Notes (Signed)
Pt has had many ortho surgeries-has done well-no heart meds-was seen 2012 for RBBB-stress test negative-

## 2013-01-29 ENCOUNTER — Encounter (HOSPITAL_BASED_OUTPATIENT_CLINIC_OR_DEPARTMENT_OTHER)
Admission: RE | Disposition: A | Discharge status: Home / Self Care | Payer: Self-pay | Source: Ambulatory Visit | Attending: Orthopedic Surgery

## 2013-01-29 ENCOUNTER — Ambulatory Visit (HOSPITAL_BASED_OUTPATIENT_CLINIC_OR_DEPARTMENT_OTHER): Payer: Medicare Other | Admitting: *Deleted

## 2013-01-29 ENCOUNTER — Ambulatory Visit (HOSPITAL_BASED_OUTPATIENT_CLINIC_OR_DEPARTMENT_OTHER)
Admission: RE | Admit: 2013-01-29 | Discharge: 2013-01-29 | Disposition: A | Discharge status: Home / Self Care | Payer: Medicare Other | Source: Ambulatory Visit | Attending: Orthopedic Surgery | Admitting: Orthopedic Surgery

## 2013-01-29 ENCOUNTER — Encounter (HOSPITAL_BASED_OUTPATIENT_CLINIC_OR_DEPARTMENT_OTHER): Payer: Self-pay | Admitting: *Deleted

## 2013-01-29 DIAGNOSIS — K219 Gastro-esophageal reflux disease without esophagitis: Secondary | ICD-10-CM | POA: Insufficient documentation

## 2013-01-29 DIAGNOSIS — M19019 Primary osteoarthritis, unspecified shoulder: Secondary | ICD-10-CM | POA: Insufficient documentation

## 2013-01-29 DIAGNOSIS — M9420 Chondromalacia, unspecified site: Secondary | ICD-10-CM | POA: Insufficient documentation

## 2013-01-29 DIAGNOSIS — Z8546 Personal history of malignant neoplasm of prostate: Secondary | ICD-10-CM | POA: Insufficient documentation

## 2013-01-29 DIAGNOSIS — M24819 Other specific joint derangements of unspecified shoulder, not elsewhere classified: Secondary | ICD-10-CM | POA: Insufficient documentation

## 2013-01-29 DIAGNOSIS — M25819 Other specified joint disorders, unspecified shoulder: Secondary | ICD-10-CM | POA: Insufficient documentation

## 2013-01-29 DIAGNOSIS — S43429A Sprain of unspecified rotator cuff capsule, initial encounter: Secondary | ICD-10-CM | POA: Diagnosis not present

## 2013-01-29 DIAGNOSIS — G8918 Other acute postprocedural pain: Secondary | ICD-10-CM | POA: Diagnosis not present

## 2013-01-29 DIAGNOSIS — M25519 Pain in unspecified shoulder: Secondary | ICD-10-CM | POA: Diagnosis not present

## 2013-01-29 DIAGNOSIS — E785 Hyperlipidemia, unspecified: Secondary | ICD-10-CM | POA: Diagnosis not present

## 2013-01-29 HISTORY — PX: SHOULDER ARTHROSCOPY: SHX128

## 2013-01-29 SURGERY — ARTHROSCOPY, SHOULDER
Anesthesia: General | Site: Shoulder | Laterality: Right | Wound class: Clean

## 2013-01-29 MED ORDER — OXYCODONE-ACETAMINOPHEN 5-325 MG PO TABS: 1.0000 | ORAL_TABLET | ORAL | Status: DC | PRN

## 2013-01-29 MED ORDER — MIDAZOLAM HCL 2 MG/2ML IJ SOLN
1.0000 mg | INTRAMUSCULAR | Status: DC | PRN
  Administered 2013-01-29: 2 mg via INTRAVENOUS

## 2013-01-29 MED ORDER — BUPIVACAINE HCL (PF) 0.5 % IJ SOLN
INTRAMUSCULAR | Status: DC | PRN
  Administered 2013-01-29: 20 mL

## 2013-01-29 MED ORDER — DEXAMETHASONE SODIUM PHOSPHATE 4 MG/ML IJ SOLN
INTRAMUSCULAR | Status: DC | PRN
  Administered 2013-01-29: 10 mg via INTRAVENOUS

## 2013-01-29 MED ORDER — OXYCODONE HCL 5 MG/5ML PO SOLN: 5.0000 mg | Freq: Once | ORAL | Status: DC | PRN

## 2013-01-29 MED ORDER — LACTATED RINGERS IV SOLN
INTRAVENOUS | Status: DC
  Administered 2013-01-29 (×2): via INTRAVENOUS

## 2013-01-29 MED ORDER — SODIUM CHLORIDE 0.9 % IR SOLN
Status: DC | PRN
  Administered 2013-01-29: 6000 mL

## 2013-01-29 MED ORDER — HYDROMORPHONE HCL PF 1 MG/ML IJ SOLN: 0.2500 mg | INTRAMUSCULAR | Status: DC | PRN

## 2013-01-29 MED ORDER — EPINEPHRINE HCL 1 MG/ML IJ SOLN
INTRAMUSCULAR | Status: DC | PRN
  Administered 2013-01-29: 2 mL

## 2013-01-29 MED ORDER — OXYCODONE HCL 5 MG PO TABS: 5.0000 mg | ORAL_TABLET | Freq: Once | ORAL | Status: DC | PRN

## 2013-01-29 MED ORDER — FENTANYL CITRATE 0.05 MG/ML IJ SOLN
50.0000 ug | INTRAMUSCULAR | Status: DC | PRN
  Administered 2013-01-29: 100 ug via INTRAVENOUS

## 2013-01-29 MED ORDER — SUCCINYLCHOLINE CHLORIDE 20 MG/ML IJ SOLN
INTRAMUSCULAR | Status: DC | PRN
  Administered 2013-01-29: 120 mg via INTRAVENOUS

## 2013-01-29 MED ORDER — LIDOCAINE HCL (CARDIAC) 20 MG/ML IV SOLN
INTRAVENOUS | Status: DC | PRN
  Administered 2013-01-29: 40 mg via INTRAVENOUS

## 2013-01-29 MED ORDER — PROPOFOL 10 MG/ML IV BOLUS
INTRAVENOUS | Status: DC | PRN
  Administered 2013-01-29: 200 mg via INTRAVENOUS

## 2013-01-29 MED ORDER — METOCLOPRAMIDE HCL 5 MG/ML IJ SOLN: 10.0000 mg | Freq: Once | INTRAMUSCULAR | Status: DC | PRN

## 2013-01-29 SURGICAL SUPPLY — 57 items
BLADE AVERAGE 25X9 (BLADE) IMPLANT
BLADE CUTTER GATOR 3.5 (BLADE) ×2 IMPLANT
BLADE GREAT WHITE 4.2 (BLADE) ×2 IMPLANT
BLADE SURG 15 STRL LF DISP TIS (BLADE) IMPLANT
BLADE SURG 15 STRL SS (BLADE)
BUR EGG 3PK/BX (BURR) IMPLANT
BUR VERTEX HOODED 4.5 (BURR) ×2 IMPLANT
CANISTER OMNI JUG 16 LITER (MISCELLANEOUS) ×2 IMPLANT
CANISTER SUCTION 2500CC (MISCELLANEOUS) IMPLANT
CANNULA 5.75X71 LONG (CANNULA) IMPLANT
CANNULA TWIST IN 8.25X7CM (CANNULA) IMPLANT
CHLORAPREP W/TINT 26ML (MISCELLANEOUS) ×2 IMPLANT
CLOTH BEACON ORANGE TIMEOUT ST (SAFETY) ×2 IMPLANT
DECANTER SPIKE VIAL GLASS SM (MISCELLANEOUS) IMPLANT
DRAPE INCISE IOBAN 66X45 STRL (DRAPES) IMPLANT
DRAPE SHOULDER BEACH CHAIR (DRAPES) ×2 IMPLANT
DRAPE STERI 35X30 U-POUCH (DRAPES) ×2 IMPLANT
ELECT REM PT RETURN 9FT ADLT (ELECTROSURGICAL) ×2
ELECTRODE REM PT RTRN 9FT ADLT (ELECTROSURGICAL) ×1 IMPLANT
GAUZE XEROFORM 1X8 LF (GAUZE/BANDAGES/DRESSINGS) ×2 IMPLANT
GLOVE BIO SURGEON STRL SZ7 (GLOVE) ×4 IMPLANT
GLOVE BIO SURGEON STRL SZ7.5 (GLOVE) ×2 IMPLANT
GLOVE BIOGEL PI IND STRL 7.0 (GLOVE) ×2 IMPLANT
GLOVE BIOGEL PI IND STRL 8 (GLOVE) ×1 IMPLANT
GLOVE BIOGEL PI INDICATOR 7.0 (GLOVE) ×2
GLOVE BIOGEL PI INDICATOR 8 (GLOVE) ×1
GOWN PREVENTION PLUS XLARGE (GOWN DISPOSABLE) ×2 IMPLANT
IV NS IRRIG 3000ML ARTHROMATIC (IV SOLUTION) IMPLANT
NDL SAFETY ECLIPSE 18X1.5 (NEEDLE) ×1 IMPLANT
NDL SUT 6 .5 CRC .975X.05 MAYO (NEEDLE) IMPLANT
NEEDLE HYPO 18GX1.5 SHARP (NEEDLE) ×1
NEEDLE MAYO TAPER (NEEDLE)
NS IRRIG 1000ML POUR BTL (IV SOLUTION) IMPLANT
PACK ARTHROSCOPY DSU (CUSTOM PROCEDURE TRAY) ×2 IMPLANT
PACK BASIN DAY SURGERY FS (CUSTOM PROCEDURE TRAY) ×2 IMPLANT
PASSER SUT SWANSON 36MM LOOP (INSTRUMENTS) IMPLANT
PENCIL BUTTON HOLSTER BLD 10FT (ELECTRODE) IMPLANT
SET IRRIG Y TYPE TUR BLADDER L (SET/KITS/TRAYS/PACK) ×2 IMPLANT
SLEEVE SCD COMPRESS KNEE MED (MISCELLANEOUS) ×2 IMPLANT
SLING ARM FOAM STRAP LRG (SOFTGOODS) ×2 IMPLANT
SLING ARM FOAM STRAP MED (SOFTGOODS) IMPLANT
SLING ARM IMMOBILIZER LRG (SOFTGOODS) IMPLANT
SPONGE GAUZE 4X4 12PLY (GAUZE/BANDAGES/DRESSINGS) ×2 IMPLANT
SPONGE LAP 4X18 X RAY DECT (DISPOSABLE) IMPLANT
SUCTION FRAZIER TIP 10 FR DISP (SUCTIONS) IMPLANT
SUT ETHIBOND 2 OS 4 DA (SUTURE) IMPLANT
SUT ETHILON 4 0 PS 2 18 (SUTURE) IMPLANT
SUT MNCRL AB 4-0 PS2 18 (SUTURE) IMPLANT
SUT VIC AB 3-0 PS1 18 (SUTURE)
SUT VIC AB 3-0 PS1 18XBRD (SUTURE) IMPLANT
SYR 5ML LL (SYRINGE) ×2 IMPLANT
SYR TB 1ML LL NO SAFETY (SYRINGE) IMPLANT
TAPE PAPER 3X10 WHT MICROPORE (GAUZE/BANDAGES/DRESSINGS) ×2 IMPLANT
TOWEL OR 17X24 6PK STRL BLUE (TOWEL DISPOSABLE) ×2 IMPLANT
TUBE CONNECTING 20X1/4 (TUBING) IMPLANT
WAND STAR VAC 90 (SURGICAL WAND) ×2 IMPLANT
WATER STERILE IRR 1000ML POUR (IV SOLUTION) ×2 IMPLANT

## 2013-01-29 NOTE — Anesthesia Preprocedure Evaluation (Signed)
Anesthesia Evaluation  Patient identified by MRN, date of birth, ID band Patient awake    Reviewed: Allergy & Precautions, H&P , NPO status , Patient's Chart, lab work & pertinent test results, reviewed documented beta blocker date and time   Airway Mallampati: II TM Distance: >3 FB Neck ROM: full    Dental   Pulmonary neg pulmonary ROS,  breath sounds clear to auscultation        Cardiovascular negative cardio ROS  + dysrhythmias Rhythm:regular     Neuro/Psych negative neurological ROS     GI/Hepatic Neg liver ROS, GERD-  Medicated and Controlled,  Endo/Other  negative endocrine ROS  Renal/GU negative Renal ROS  negative genitourinary   Musculoskeletal   Abdominal   Peds  Hematology negative hematology ROS (+)   Anesthesia Other Findings See surgeon's H&P   Reproductive/Obstetrics negative OB ROS                           Anesthesia Physical Anesthesia Plan  ASA: II  Anesthesia Plan: General   Post-op Pain Management:    Induction: Intravenous  Airway Management Planned: Oral ETT  Additional Equipment:   Intra-op Plan:   Post-operative Plan: Extubation in OR  Informed Consent: I have reviewed the patients History and Physical, chart, labs and discussed the procedure including the risks, benefits and alternatives for the proposed anesthesia with the patient or authorized representative who has indicated his/her understanding and acceptance.   Dental Advisory Given  Plan Discussed with: CRNA and Surgeon  Anesthesia Plan Comments:         Anesthesia Quick Evaluation

## 2013-01-29 NOTE — Anesthesia Procedure Notes (Addendum)
Anesthesia Regional Block:  Interscalene brachial plexus block  Pre-Anesthetic Checklist: ,, timeout performed, Correct Patient, Correct Site, Correct Laterality, Correct Procedure, Correct Position, site marked, Risks and benefits discussed,  Surgical consent,  Pre-op evaluation,  At surgeon's request and post-op pain management  Laterality: Right  Prep: chloraprep       Needles:   Needle Type: Other     Needle Length: 9cm  Needle Gauge: 21    Additional Needles:  Procedures: ultrasound guided (picture in chart) Interscalene brachial plexus block Narrative:  Start time: 01/29/2013 10:46 AM End time: 01/29/2013 10:52 AM Injection made incrementally with aspirations every 5 mL.  Performed by: Personally  Anesthesiologist: Aldona Lento, MD  Additional Notes: Ultrasound guidance used to: id relevant anatomy, confirm needle position, local anesthetic spread, avoidance of vascular puncture. Picture saved. No complications. Block performed personally by Janetta Hora. Gelene Mink, MD    Interscalene brachial plexus block Procedure Name: Intubation Date/Time: 01/29/2013 11:37 AM Performed by: Meyer Russel Pre-anesthesia Checklist: Patient identified, Emergency Drugs available, Suction available and Patient being monitored Patient Re-evaluated:Patient Re-evaluated prior to inductionOxygen Delivery Method: Circle System Utilized Preoxygenation: Pre-oxygenation with 100% oxygen Intubation Type: IV induction Ventilation: Mask ventilation without difficulty Laryngoscope Size: Miller and 2 Grade View: Grade II Tube type: Oral Tube size: 8.0 mm Number of attempts: 1 Airway Equipment and Method: stylet Placement Confirmation: ETT inserted through vocal cords under direct vision,  positive ETCO2 and breath sounds checked- equal and bilateral Secured at: 23 cm Tube secured with: Tape Dental Injury: Teeth and Oropharynx as per pre-operative assessment

## 2013-01-29 NOTE — Op Note (Signed)
Preoperative diagnosis: R shoulder impingement syndrome, a.c. joint arthritis, possible labral tear, glenohumeral arthritis  Postoperative diagnosis: Same   Procedure: Right shoulder arthroscopic, formal distal clavicle excision, debridement chondromalacia grade 3 and grade 4 to the humeral head and grade 3 to the glenoid, debridement degenerative tearing of the labrum.  Surgeon: Feliberto Gottron. Turner Daniels M.D.   First assistant: Shirl Harris PA-C   Anesthetic: Right shoulder interscalene block plus general endotracheal   Estimated blood loss: Minimal   Fluid replacement: 1200 cc of crystalloid.   Indications for procedure: Patient with shoulder impingement syndrome, glenohumeral arthritis and a.c. joint arthritis who is failed conservative measures with anti-inflammatory medicines, therapy and exercises, but did get temporary relief from a cortisone injection in the subacromial space. 12 years ago he had a rotator cuff repair that has done well including heavy weight lifting over the last 12 years. Because of increasing pain and weakness patient desires elective arthroscopic evaluation with acromioplasty, distal clavicle excision and we will also address any other intra-articular pathology. Risks and benefits of surgery were discussed prior to the procedure and all questions answered. Preoperatively his rotator cuff power is quite good and I do not believe he has a full-thickness tear.  Description of procedure: Patient was identified by arm band and given preoperative IV antibiotics in the holding area, as well as interscalene block anesthetic.Marland Kitchen Patient was taken to the operating room where the appropriate anesthetic monitors were attached and general endotracheal anesthesia was induced with the patient in the supine position. Patient was then placed in the beachchair position and the R upper extremity prepped and draped in the usual sterile fashion from the wrist to the hemithorax. A time out procedure  was performed. We began the operation by making standard portals 1.5 cm anterior to the acromion, 1.5 cm lateral to the junction of the middle and posterior thirds of the acromion, and 1.5 cm posterior the posterior lateral corner of the acromion process. The inflow with gravity was placed anteriorly, the arthroscope laterally, and a 4.2 great-white sucker shaver posteriorly. The subacromial bursa was resected and we visualized the rotator cuff that was intact including a couple of Ethibond sutures they were removed without difficulty. We then visualized the arthritic a.c. joint and the inferior distal centimeter of the clavicle was resected using a 4.5 hooded vortex bur from the posterior portal. We then brought the burr anteriorly, the scope posteriorly, and the inflow laterally completing the 1 cm distal clavicle excision. Moving into the glenohumeral joint the articular and the labral cartilages were visualized and the patient had grade 3 and grade 4 chondromalacia the humeral head with flap tears that was debridement, grade 3 chondral malacia of the glenoid that was debrided globally as well as degenerative tearing of the labrum that was likewise debrided. The biceps anchor was intact and internally the rotator cuff repair was intact. 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 At this point the shoulder was irrigated out normal saline solution the arthroscopic instruments removed and a dressing of Xerofoam 4 x 4 dressing sponges, paper tape, and sling applied the patient was then placed in supine awakened extubated and taken to the recovery without difficulty.

## 2013-01-29 NOTE — Interval H&P Note (Signed)
History and Physical Interval Note:  01/29/2013 11:12 AM  [name]  has presented today for surgery, with the diagnosis of RIGHT SHOULDER IMPINGEMENT, ROTATOR CUFF TEAR/CHONDROMALACIA  The various methods of treatment have been discussed with the patient and family. After consideration of risks, benefits and other options for treatment, the patient has consented to  Procedure(s): RIGHT ARTHROSCOPY SHOULDER WITH DEBRIDEMENT, POSSIBLE ROTATOR CUFF REPAIR (Right) as a surgical intervention .  The patient's history has been reviewed, patient examined, no change in status, stable for surgery.  I have reviewed the patient's chart and labs.  Questions were answered to the patient's satisfaction.     Scott [name]

## 2013-01-29 NOTE — Progress Notes (Signed)
Assisted Dr. Frederick with right, ultrasound guided, interscalene  block. Side rails up, monitors on throughout procedure. See vital signs in flow sheet. Tolerated Procedure well. 

## 2013-01-30 ENCOUNTER — Encounter (HOSPITAL_BASED_OUTPATIENT_CLINIC_OR_DEPARTMENT_OTHER): Payer: Self-pay | Admitting: Orthopedic Surgery

## 2013-01-30 NOTE — Anesthesia Postprocedure Evaluation (Signed)
Anesthesia Post Note  Patient: Scott [name]  Procedure(s) Performed: Procedure(s) (LRB): [name] ARTHROSCOPY SHOULDER WITH DEBRIDEMENT, POSSIBLE ROTATOR CUFF REPAIR ([name])  Anesthesia type: General  Patient location: PACU  Post pain: Pain level controlled  Post assessment: Patient's Cardiovascular Status Stable  Last Vitals:  Filed Vitals:   01/29/13 1345  BP: 121/79  Pulse: 52  Temp: 36.5 C  Resp: 20    Post vital signs: Reviewed and stable  Level of consciousness: alert  Complications: No apparent anesthesia complications

## 2013-01-30 NOTE — Transfer of Care (Signed)
Immediate Anesthesia Transfer of Care Note  Patient: Scott [name]  Procedure(s) Performed: Procedure(s): [name] ARTHROSCOPY SHOULDER WITH DEBRIDEMENT, POSSIBLE ROTATOR CUFF REPAIR ([name])  Patient Location: PACU  Anesthesia Type:General  Level of Consciousness: awake  Airway & Oxygen Therapy: Patient Spontanous Breathing  Post-op Assessment: Report given to PACU RN and Post -op Vital signs reviewed and stable  Post vital signs: Reviewed and stable  Complications: No apparent anesthesia complications

## 2013-02-03 DIAGNOSIS — M25539 Pain in unspecified wrist: Secondary | ICD-10-CM | POA: Diagnosis not present

## 2013-02-06 DIAGNOSIS — M19039 Primary osteoarthritis, unspecified wrist: Secondary | ICD-10-CM | POA: Diagnosis not present

## 2013-03-14 DIAGNOSIS — M25539 Pain in unspecified wrist: Secondary | ICD-10-CM | POA: Diagnosis not present

## 2013-06-25 DIAGNOSIS — M25539 Pain in unspecified wrist: Secondary | ICD-10-CM | POA: Diagnosis not present

## 2013-07-03 DIAGNOSIS — H179 Unspecified corneal scar and opacity: Secondary | ICD-10-CM | POA: Diagnosis not present

## 2013-07-03 DIAGNOSIS — Z961 Presence of intraocular lens: Secondary | ICD-10-CM | POA: Diagnosis not present

## 2013-07-03 DIAGNOSIS — H1130 Conjunctival hemorrhage, unspecified eye: Secondary | ICD-10-CM | POA: Diagnosis not present

## 2013-09-03 DIAGNOSIS — M25559 Pain in unspecified hip: Secondary | ICD-10-CM | POA: Diagnosis not present

## 2013-09-03 DIAGNOSIS — M25519 Pain in unspecified shoulder: Secondary | ICD-10-CM | POA: Diagnosis not present

## 2013-10-08 ENCOUNTER — Telehealth: Payer: Self-pay

## 2013-10-08 ENCOUNTER — Other Ambulatory Visit: Payer: Self-pay | Admitting: Family Medicine

## 2013-10-08 DIAGNOSIS — Z1211 Encounter for screening for malignant neoplasm of colon: Secondary | ICD-10-CM

## 2013-10-08 NOTE — Telephone Encounter (Signed)
Pt left v/m requesting colonoscopy referral faxed to [phone] Dr Kennis Carina in [place] said has been over 5 years since had colonoscopy; not having any problems at present. Pt request cb.

## 2013-11-17 ENCOUNTER — Other Ambulatory Visit: Payer: Self-pay | Admitting: Family Medicine

## 2013-11-17 DIAGNOSIS — Z79899 Other long term (current) drug therapy: Secondary | ICD-10-CM

## 2013-11-17 DIAGNOSIS — Z125 Encounter for screening for malignant neoplasm of prostate: Secondary | ICD-10-CM

## 2013-11-17 DIAGNOSIS — E785 Hyperlipidemia, unspecified: Secondary | ICD-10-CM

## 2013-11-20 ENCOUNTER — Other Ambulatory Visit (INDEPENDENT_AMBULATORY_CARE_PROVIDER_SITE_OTHER): Payer: Medicare Other

## 2013-11-20 DIAGNOSIS — E785 Hyperlipidemia, unspecified: Secondary | ICD-10-CM | POA: Diagnosis not present

## 2013-11-20 DIAGNOSIS — Z79899 Other long term (current) drug therapy: Secondary | ICD-10-CM

## 2013-11-20 DIAGNOSIS — Z125 Encounter for screening for malignant neoplasm of prostate: Secondary | ICD-10-CM | POA: Diagnosis not present

## 2013-11-20 LAB — LIPID PANEL
CHOLESTEROL: 222 mg/dL — AB (ref 0–200)
HDL: 39 mg/dL — ABNORMAL LOW (ref >39.00)
LDL Cholesterol: 170 mg/dL — ABNORMAL HIGH (ref 0–99)
TRIGLYCERIDES: 65 mg/dL (ref 0.0–149.0)
Total CHOL/HDL Ratio: 6
VLDL: 13 mg/dL (ref 0.0–40.0)

## 2013-11-20 LAB — CBC WITH DIFFERENTIAL/PLATELET
BASOS ABS: 0 10*3/uL (ref 0.0–0.1)
Basophils Relative: 0.5 % (ref 0.0–3.0)
EOS PCT: 3.5 % (ref 0.0–5.0)
Eosinophils Absolute: 0.1 10*3/uL (ref 0.0–0.7)
HEMATOCRIT: 45.6 % (ref 39.0–52.0)
Hemoglobin: 15.4 g/dL (ref 13.0–17.0)
LYMPHS ABS: 1.3 10*3/uL (ref 0.7–4.0)
Lymphocytes Relative: 29.7 % (ref 12.0–46.0)
MCHC: 33.8 g/dL (ref 30.0–36.0)
MCV: 89.1 fl (ref 78.0–100.0)
MONO ABS: 0.6 10*3/uL (ref 0.1–1.0)
Monocytes Relative: 13.2 % — ABNORMAL HIGH (ref 3.0–12.0)
NEUTROS PCT: 53.1 % (ref 43.0–77.0)
Neutro Abs: 2.2 10*3/uL (ref 1.4–7.7)
PLATELETS: 188 10*3/uL (ref 150.0–400.0)
RBC: 5.12 Mil/uL (ref 4.22–5.81)
RDW: 13.7 % (ref 11.5–14.6)
WBC: 4.2 10*3/uL — AB (ref 4.5–10.5)

## 2013-11-20 LAB — BASIC METABOLIC PANEL
BUN: 18 mg/dL (ref 6–23)
CALCIUM: 9.4 mg/dL (ref 8.4–10.5)
CHLORIDE: 105 meq/L (ref 96–112)
CO2: 23 mEq/L (ref 19–32)
CREATININE: 0.9 mg/dL (ref 0.4–1.5)
GFR: 86.27 mL/min (ref >60.00)
Glucose, Bld: 105 mg/dL — ABNORMAL HIGH (ref 70–99)
Potassium: 4.3 mEq/L (ref 3.5–5.1)
Sodium: 140 mEq/L (ref 135–145)

## 2013-11-20 LAB — HEPATIC FUNCTION PANEL
ALBUMIN: 4.3 g/dL (ref 3.5–5.2)
ALK PHOS: 61 U/L (ref 39–117)
ALT: 30 U/L (ref 0–53)
AST: 33 U/L (ref 0–37)
BILIRUBIN DIRECT: 0.1 mg/dL (ref 0.0–0.3)
TOTAL PROTEIN: 6.7 g/dL (ref 6.0–8.3)
Total Bilirubin: 1 mg/dL (ref 0.3–1.2)

## 2013-11-20 LAB — PSA, MEDICARE: PSA: 0.01 ng/mL — AB (ref 0.10–4.00)

## 2013-11-26 ENCOUNTER — Encounter: Payer: Self-pay | Admitting: Family Medicine

## 2013-11-26 ENCOUNTER — Ambulatory Visit (INDEPENDENT_AMBULATORY_CARE_PROVIDER_SITE_OTHER): Payer: Medicare Other | Admitting: Family Medicine

## 2013-11-26 VITALS — BP 118/78 | HR 66 | Temp 97.5°F | Ht 68.25 in | Wt 177.5 lb

## 2013-11-26 DIAGNOSIS — Z0000 Encounter for general adult medical examination without abnormal findings: Secondary | ICD-10-CM

## 2013-11-26 DIAGNOSIS — E785 Hyperlipidemia, unspecified: Secondary | ICD-10-CM

## 2013-11-26 DIAGNOSIS — Z23 Encounter for immunization: Secondary | ICD-10-CM

## 2013-11-26 DIAGNOSIS — I4510 Unspecified right bundle-branch block: Secondary | ICD-10-CM

## 2013-11-26 DIAGNOSIS — Z125 Encounter for screening for malignant neoplasm of prostate: Secondary | ICD-10-CM

## 2013-11-26 DIAGNOSIS — K219 Gastro-esophageal reflux disease without esophagitis: Secondary | ICD-10-CM

## 2013-11-26 DIAGNOSIS — C61 Malignant neoplasm of prostate: Secondary | ICD-10-CM

## 2013-11-26 DIAGNOSIS — I440 Atrioventricular block, first degree: Secondary | ICD-10-CM

## 2013-11-26 MED ORDER — ZOSTER VACCINE LIVE 19400 UNT/0.65ML ~~LOC~~ SOLR: 0.6500 mL | Freq: Once | SUBCUTANEOUS | Status: DC

## 2013-11-26 NOTE — Progress Notes (Addendum)
Date:  11/26/2013   Name:  Scott [name]   DOB:  1941-04-26   MRN:  [id number] Gender: male Age: 73 y.o.  Primary Physician:  Owens Loffler, MD   Chief Complaint: Medicare Wellness   Subjective:   History of Present Illness:  Scott [name] is a 73 y.o. pleasant patient who presents with the following:  Preventative Health Maintenance Visit:  Health Maintenance Summary Reviewed and updated, unless pt declines services.  Tobacco History Reviewed. Alcohol: No concerns, no excessive use Exercise Habits: working out 7 days a week STD concerns: no risk or activity to increase risk Drug Use: None Encouraged self-testicular check  Due for colonoscopy, MD in [place] had some paperwork that I needed to fill out for him.   Immunization History  Administered Date(s) Administered  . Influenza Split 06/13/2011  . Pneumococcal Conjugate-13 11/26/2013    Patient Active Problem List   Diagnosis Date Noted  . AV BLOCK, 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  . HYPERLIPIDEMIA 04/28/2010  . ALLERGIC RHINITIS 04/28/2010  . GERD 04/28/2010  . ORGANIC IMPOTENCE 04/28/2010    Past Medical History  Diagnosis Date  . Allergic rhinitis due to pollen   . GERD (gastroesophageal reflux disease)   . Hyperlipidemia   . Arthritis   . Prostate cancer     cancer  . Right bundle branch block     RBBB    Past Surgical History  Procedure Laterality Date  . Prostatectomy  1998  . Appendectomy    . Tonsillectomy    . Total hip arthroplasty  2007    right   . Rotator cuff repair      right  . Hernia repair  85,03    rt and lt ing hernia  . Knee arthroscopy  2012    left  . Shoulder arthroscopy Right 01/29/2013    Procedure: RIGHT ARTHROSCOPY SHOULDER WITH DEBRIDEMENT, POSSIBLE ROTATOR CUFF REPAIR;  Surgeon: Kerin Salen, MD;  Location: [facility];  Service: Orthopedics;  Laterality: Right;    History   Social History  . Marital Status: Married    Spouse Name: N/A   Number of Children: N/A  . Years of Education: N/A   Occupational History  . auctioneer    Social History Main Topics  . Smoking status: Never Smoker   . Smokeless tobacco: Never Used  . Alcohol Use: No  . Drug Use: No  . Sexual Activity: Not on file   Other Topics Concern  . Not on file   Social History Narrative   Former competitive bodybuilder, Geophysicist/field seismologist, all natural   [employer], [employer], multiple years    Family History  Problem Relation Age of Onset  . Cancer Father     prostate  . Cancer Brother     prostate  . Cancer Brother     prostate    No Known Allergies  Medication list has been reviewed and updated.  Review of Systems:  General: Denies fever, chills, sweats. No significant weight loss. Eyes: Denies blurring,significant itching ENT: Denies earache, sore throat, and hoarseness. Cardiovascular: Denies chest pains, palpitations, dyspnea on exertion Respiratory: Denies cough, dyspnea at rest,wheeezing Breast: no concerns about lumps GI: Denies nausea, vomiting, diarrhea, constipation, change in bowel habits, abdominal pain, melena, hematochezia GU: Denies penile discharge, ED, urinary flow / outflow problems. No STD concerns. Musculoskeletal: Denies back pain, joint pain Derm: Denies rash, itching Neuro: Denies  paresthesias, frequent falls, frequent headaches Psych: Denies depression, anxiety Endocrine:  Denies cold intolerance, heat intolerance, polydipsia Heme: Denies enlarged lymph nodes Allergy: No hayfever  Objective:   Physical Examination: BP 118/78  Pulse 66  Temp(Src) 97.5 F (36.4 C) (Oral)  Ht 5' 8.25" (1.734 m)  Wt 177 lb 8 oz (80.513 kg)  BMI 26.78 kg/m2 Ideal Body Weight: Weight in (lb) to have BMI = 25: 165.3  GEN: well developed, well nourished, no acute distress Eyes: conjunctiva and lids normal, PERRLA, EOMI ENT: TM clear, nares clear, oral exam WNL Neck: supple, no lymphadenopathy, no  thyromegaly, no JVD Pulm: clear to auscultation and percussion, respiratory effort normal CV: regular rate and rhythm, S1-S2, no murmur, rub or gallop, no bruits, peripheral pulses normal and symmetric, no cyanosis, clubbing, edema or varicosities GI: soft, non-tender; no hepatosplenomegaly, masses; active bowel sounds all quadrants GU: no hernia, testicular mass, penile discharge Lymph: no cervical, axillary or inguinal adenopathy MSK: gait normal, muscle tone and strength WNL, no joint swelling, effusions, discoloration, crepitus  SKIN: clear, good turgor, color WNL, no rashes, lesions, or ulcerations Neuro: normal mental status, normal strength, sensation, and motion Psych: alert; oriented to person, place and time, normally interactive and not anxious or depressed in appearance.  All labs reviewed with patient.  Lipids:    Component Value Date/Time   CHOL 222* 11/20/2013 0828   TRIG 65.0 11/20/2013 0828   HDL 39.00* 11/20/2013 0828   LDLDIRECT 162.7 08/02/2010 0824   VLDL 13.0 11/20/2013 0828   CHOLHDL 6 11/20/2013 0828    CBC:    Component Value Date/Time   WBC 4.2* 11/20/2013 0828   HGB 15.4 11/20/2013 0828   HCT 45.6 11/20/2013 0828   PLT 188.0 11/20/2013 0828   MCV 89.1 11/20/2013 0828   NEUTROABS 2.2 11/20/2013 0828   LYMPHSABS 1.3 11/20/2013 0828   MONOABS 0.6 11/20/2013 0828   EOSABS 0.1 11/20/2013 0828   BASOSABS 0.0 11/20/2013 2620    Basic Metabolic Panel:    Component Value Date/Time   NA 140 11/20/2013 0828   K 4.3 11/20/2013 0828   CL 105 11/20/2013 0828   CO2 23 11/20/2013 0828   BUN 18 11/20/2013 0828   CREATININE 0.9 11/20/2013 0828   GLUCOSE 105* 11/20/2013 0828   CALCIUM 9.4 11/20/2013 0828    Lab Results  Component Value Date   ALT 30 11/20/2013   AST 33 11/20/2013   ALKPHOS 61 11/20/2013   BILITOT 1.0 11/20/2013    No results found for this basename: TSH    Lab Results  Component Value Date   PSA 0.01* 11/20/2013   PSA 0.01* 11/13/2011   PSA 0.01 10/19/2009    Assessment & Plan:    Health Maintenance Exam: The patient's preventative maintenance and recommended screening tests for an annual wellness exam were reviewed in full today. Brought up to date unless services declined.  Counselled on the importance of diet, exercise, and its role in overall health and mortality. The patient's FH and SH was reviewed, including their home life, tobacco status, and drug and alcohol status.  I have personally reviewed the Medicare Annual Wellness questionnaire and have noted 1. The patient's medical and social history 2. Their use of alcohol, tobacco or illicit drugs 3. Their current medications and supplements 4. The patient's functional ability including ADL's, fall risks, home safety risks and hearing or visual             impairment. 5. Diet and physical activities 6. Evidence for depression or mood disorders  The patients weight, height,  BMI and visual acuity have been recorded in the chart I have made referrals, counseling and provided education to the patient based review of the above and I have provided the pt with a written personalized care plan for preventive services.  I have provided the patient with a copy of your personalized plan for preventive services. Instructed to take the time to review along with their updated medication list.   Really doing quite well. Borderline cholesterol. Work on cleaning up diet and cutting some weight.  He needs a repeat colonoscopy. His GI in [place] asked for me to fill out paperwork which I did face to face with the patient.  Follow-up: No Follow-up on file.  New Prescriptions   ZOSTER VACCINE LIVE, PF, (ZOSTAVAX) 77824 UNT/0.65ML INJECTION    Inject 19,400 Units into the skin once.   Orders Placed This Encounter  Procedures  . Pneumococcal conjugate vaccine 13-valent   There are no Patient Instructions on file for this visit.  Signed,  Maud Deed. Crystle Carelli, MD, [place] at [facility] [place] [address] Phone: [phone] Fax: [phone]  Patient's Medications  New Prescriptions   ZOSTER VACCINE LIVE, PF, (ZOSTAVAX) 95093 UNT/0.65ML INJECTION    Inject 19,400 Units into the skin once.  Previous Medications   ASPIRIN 81 MG TABLET    Take 81 mg by mouth daily.     B COMPLEX VITAMINS TABLET    Take 1 tablet by mouth daily.   CELECOXIB (CELEBREX) 200 MG CAPSULE    Take 200 mg by mouth daily.     CYANOCOBALAMIN 2000 MCG TABLET    Take 2,000 mcg by mouth daily.   OMEGA-3 FATTY ACIDS (FISH OIL) 300 MG CAPS    Take 1 capsule by mouth daily.   OMEPRAZOLE (PRILOSEC) 40 MG CAPSULE    Take 40 mg by mouth daily.   RED YEAST RICE 600 MG CAPS    Take 1 capsule by mouth daily.     VITAMIN E (VITAMIN E) 400 UNIT CAPSULE    Take 400 Units by mouth daily.  Modified Medications   No medications on file  Discontinued Medications   OXYCODONE-ACETAMINOPHEN (ROXICET) 5-325 MG PER TABLET    Take 1-2 tablets by mouth every 4 (four) hours as needed for pain.   PANTOPRAZOLE (PROTONIX) 40 MG TABLET    Take 1 tablet (40 mg total) by mouth daily. * Needs to schedule appointment before these refills run out*   TADALAFIL (CIALIS) 20 MG TABLET    Take 20 mg by mouth daily as needed.     VITAMIN B-12 (CYANOCOBALAMIN) 1000 MCG TABLET    Take 1,000 mcg by mouth daily.

## 2013-11-26 NOTE — Progress Notes (Signed)
Pre visit review using our clinic review tool, if applicable. No additional management support is needed unless otherwise documented below in the visit note. 

## 2013-11-27 DIAGNOSIS — C61 Malignant neoplasm of prostate: Secondary | ICD-10-CM | POA: Insufficient documentation

## 2013-11-27 DIAGNOSIS — I4510 Unspecified right bundle-branch block: Secondary | ICD-10-CM | POA: Insufficient documentation

## 2013-12-03 DIAGNOSIS — M19049 Primary osteoarthritis, unspecified hand: Secondary | ICD-10-CM | POA: Diagnosis not present

## 2014-01-19 DIAGNOSIS — Z1211 Encounter for screening for malignant neoplasm of colon: Secondary | ICD-10-CM | POA: Diagnosis not present

## 2014-01-19 DIAGNOSIS — Z86010 Personal history of colonic polyps: Secondary | ICD-10-CM | POA: Diagnosis not present

## 2014-01-19 DIAGNOSIS — Z8546 Personal history of malignant neoplasm of prostate: Secondary | ICD-10-CM | POA: Diagnosis not present

## 2014-01-19 DIAGNOSIS — K5730 Diverticulosis of large intestine without perforation or abscess without bleeding: Secondary | ICD-10-CM | POA: Diagnosis not present

## 2014-01-19 DIAGNOSIS — K219 Gastro-esophageal reflux disease without esophagitis: Secondary | ICD-10-CM | POA: Diagnosis not present

## 2014-01-19 DIAGNOSIS — Z7982 Long term (current) use of aspirin: Secondary | ICD-10-CM | POA: Diagnosis not present

## 2014-01-19 LAB — HM COLONOSCOPY

## 2014-01-28 ENCOUNTER — Encounter: Payer: Self-pay | Admitting: Family Medicine

## 2014-04-30 DIAGNOSIS — D1801 Hemangioma of skin and subcutaneous tissue: Secondary | ICD-10-CM | POA: Diagnosis not present

## 2014-04-30 DIAGNOSIS — L570 Actinic keratosis: Secondary | ICD-10-CM | POA: Diagnosis not present

## 2014-04-30 DIAGNOSIS — L821 Other seborrheic keratosis: Secondary | ICD-10-CM | POA: Diagnosis not present

## 2014-04-30 DIAGNOSIS — Z85828 Personal history of other malignant neoplasm of skin: Secondary | ICD-10-CM | POA: Diagnosis not present

## 2014-06-01 DIAGNOSIS — Z23 Encounter for immunization: Secondary | ICD-10-CM | POA: Diagnosis not present

## 2014-07-15 DIAGNOSIS — M1990 Unspecified osteoarthritis, unspecified site: Secondary | ICD-10-CM | POA: Diagnosis not present

## 2015-04-21 DIAGNOSIS — Z09 Encounter for follow-up examination after completed treatment for conditions other than malignant neoplasm: Secondary | ICD-10-CM | POA: Diagnosis not present

## 2015-04-21 DIAGNOSIS — Z96649 Presence of unspecified artificial hip joint: Secondary | ICD-10-CM | POA: Diagnosis not present

## 2015-04-27 ENCOUNTER — Other Ambulatory Visit: Payer: Self-pay | Admitting: Family Medicine

## 2015-04-27 DIAGNOSIS — Z125 Encounter for screening for malignant neoplasm of prostate: Secondary | ICD-10-CM

## 2015-04-27 DIAGNOSIS — E785 Hyperlipidemia, unspecified: Secondary | ICD-10-CM

## 2015-04-27 DIAGNOSIS — Z79899 Other long term (current) drug therapy: Secondary | ICD-10-CM

## 2015-04-28 ENCOUNTER — Other Ambulatory Visit (INDEPENDENT_AMBULATORY_CARE_PROVIDER_SITE_OTHER): Payer: Medicare Other

## 2015-04-28 DIAGNOSIS — E785 Hyperlipidemia, unspecified: Secondary | ICD-10-CM | POA: Diagnosis not present

## 2015-04-28 DIAGNOSIS — Z79899 Other long term (current) drug therapy: Secondary | ICD-10-CM | POA: Diagnosis not present

## 2015-04-28 DIAGNOSIS — Z125 Encounter for screening for malignant neoplasm of prostate: Secondary | ICD-10-CM | POA: Diagnosis not present

## 2015-04-28 LAB — CBC WITH DIFFERENTIAL/PLATELET
BASOS PCT: 0.4 % (ref 0.0–3.0)
Basophils Absolute: 0 10*3/uL (ref 0.0–0.1)
EOS ABS: 0.2 10*3/uL (ref 0.0–0.7)
Eosinophils Relative: 3.4 % (ref 0.0–5.0)
HEMATOCRIT: 47.2 % (ref 39.0–52.0)
Hemoglobin: 15.6 g/dL (ref 13.0–17.0)
Lymphocytes Relative: 19.6 % (ref 12.0–46.0)
Lymphs Abs: 1.1 10*3/uL (ref 0.7–4.0)
MCHC: 33.2 g/dL (ref 30.0–36.0)
MCV: 89.1 fl (ref 78.0–100.0)
MONO ABS: 0.6 10*3/uL (ref 0.1–1.0)
Monocytes Relative: 10.8 % (ref 3.0–12.0)
NEUTROS ABS: 3.6 10*3/uL (ref 1.4–7.7)
Neutrophils Relative %: 65.8 % (ref 43.0–77.0)
PLATELETS: 190 10*3/uL (ref 150.0–400.0)
RBC: 5.3 Mil/uL (ref 4.22–5.81)
RDW: 14.7 % (ref 11.5–15.5)
WBC: 5.4 10*3/uL (ref 4.0–10.5)

## 2015-04-28 LAB — LIPID PANEL
CHOL/HDL RATIO: 6
Cholesterol: 209 mg/dL — ABNORMAL HIGH (ref 0–200)
HDL: 34.5 mg/dL — ABNORMAL LOW (ref >39.00)
LDL Cholesterol: 140 mg/dL — ABNORMAL HIGH (ref 0–99)
NONHDL: 174.56
TRIGLYCERIDES: 172 mg/dL — AB (ref 0.0–149.0)
VLDL: 34.4 mg/dL (ref 0.0–40.0)

## 2015-04-28 LAB — BASIC METABOLIC PANEL
BUN: 15 mg/dL (ref 6–23)
CHLORIDE: 104 meq/L (ref 96–112)
CO2: 30 meq/L (ref 19–32)
CREATININE: 0.89 mg/dL (ref 0.40–1.50)
Calcium: 9.4 mg/dL (ref 8.4–10.5)
GFR: 89.27 mL/min (ref >60.00)
Glucose, Bld: 99 mg/dL (ref 70–99)
Potassium: 4.9 mEq/L (ref 3.5–5.1)
Sodium: 140 mEq/L (ref 135–145)

## 2015-04-28 LAB — HEPATIC FUNCTION PANEL
ALK PHOS: 70 U/L (ref 39–117)
ALT: 27 U/L (ref 0–53)
AST: 24 U/L (ref 0–37)
Albumin: 4.2 g/dL (ref 3.5–5.2)
BILIRUBIN DIRECT: 0.1 mg/dL (ref 0.0–0.3)
BILIRUBIN TOTAL: 0.6 mg/dL (ref 0.2–1.2)
Total Protein: 6.8 g/dL (ref 6.0–8.3)

## 2015-04-28 LAB — PSA, MEDICARE: PSA: 0.01 ng/mL — AB (ref 0.10–4.00)

## 2015-05-05 ENCOUNTER — Encounter: Payer: Self-pay | Admitting: Family Medicine

## 2015-05-05 ENCOUNTER — Ambulatory Visit (INDEPENDENT_AMBULATORY_CARE_PROVIDER_SITE_OTHER): Payer: Medicare Other | Admitting: Family Medicine

## 2015-05-05 VITALS — BP 120/74 | HR 71 | Temp 97.6°F | Ht 68.0 in | Wt 177.5 lb

## 2015-05-05 DIAGNOSIS — Z0000 Encounter for general adult medical examination without abnormal findings: Secondary | ICD-10-CM | POA: Diagnosis not present

## 2015-05-05 DIAGNOSIS — B356 Tinea cruris: Secondary | ICD-10-CM

## 2015-05-05 DIAGNOSIS — Z23 Encounter for immunization: Secondary | ICD-10-CM

## 2015-05-05 MED ORDER — TERBINAFINE HCL 1 % EX CREA: TOPICAL_CREAM | Freq: Two times a day (BID) | CUTANEOUS | Status: AC

## 2015-05-05 MED ORDER — ZOSTER VACCINE LIVE 19400 UNT/0.65ML ~~LOC~~ SOLR: 0.6500 mL | Freq: Once | SUBCUTANEOUS | Status: DC

## 2015-05-05 NOTE — Progress Notes (Signed)
Dr. Frederico Hamman T. Keylen Eckenrode, MD, [facility] [place], [address] Phone: [phone] Fax: [phone]  05/05/2015  Patient: Scott [name], MRN: [id number], DOB: [birth year], 74 y.o.  Primary Physician:  Owens Loffler, MD  Chief Complaint: Annual Exam  Subjective:   Scott [name] is a 74 y.o. pleasant patient who presents for a medicare wellness examination:  Preventative Health Maintenance Visit:  Health Maintenance Summary Reviewed and updated, unless pt declines services.  Tobacco History Reviewed. Alcohol: No concerns, no excessive use Exercise Habits: EVERY DAY STD concerns: no risk or activity to increase risk Drug Use: None Encouraged self-testicular check  Doing very well.  Health Maintenance  Topic Date Due  . TETANUS/TDAP  03/11/1962  . ZOSTAVAX  03/12/2003  . INFLUENZA VACCINE  03/21/2016  . COLONOSCOPY  01/20/2019  . PNA vac Low Risk Adult  Completed    Immunization History  Administered Date(s) Administered  . Influenza Split 06/13/2011  . Influenza,inj,Quad PF,36+ Mos 05/05/2015  . Influenza-Unspecified 06/01/2014  . Pneumococcal Conjugate-13 11/26/2013  . Pneumococcal Polysaccharide-23 05/05/2015    Patient Active Problem List   Diagnosis Date Noted  . Prostate cancer, in remission   . Right bundle branch block   . AV BLOCK, 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  . HYPERLIPIDEMIA 04/28/2010  . ALLERGIC RHINITIS 04/28/2010  . GERD 04/28/2010  . Erectile dysfunction following radical prostatectomy 04/28/2010   Past Medical History  Diagnosis Date  . Allergic rhinitis due to pollen   . GERD (gastroesophageal reflux disease)   . Hyperlipidemia   . Arthritis   . Prostate cancer     cancer  . Right bundle branch block     RBBB   Past Surgical History  Procedure Laterality Date  . Prostatectomy  1998  . Appendectomy    . Tonsillectomy    . Total hip arthroplasty  2007    right   . Rotator  cuff repair      right  . Hernia repair  85,03    rt and lt ing hernia  . Knee arthroscopy  2012    left  . Shoulder arthroscopy Right 01/29/2013    Procedure: RIGHT ARTHROSCOPY SHOULDER WITH DEBRIDEMENT, POSSIBLE ROTATOR CUFF REPAIR;  Surgeon: Kerin Salen, MD;  Location: [facility];  Service: Orthopedics;  Laterality: Right;   Social History   Social History  . Marital Status: Married    Spouse Name: N/A  . Number of Children: N/A  . Years of Education: N/A   Occupational History  . auctioneer    Social History Main Topics  . Smoking status: Never Smoker   . Smokeless tobacco: Never Used  . Alcohol Use: No  . Drug Use: No  . Sexual Activity: Not on file   Other Topics Concern  . Not on file   Social History Narrative   Former competitive bodybuilder, Geophysicist/field seismologist, all natural   [employer], multiple years   Family History  Problem Relation Age of Onset  . Cancer Father     prostate  . Cancer Brother     prostate  . Cancer Brother     prostate   No Known Allergies  Medication list has been reviewed and updated.   General: Denies fever, chills, sweats. No significant weight loss. Eyes: Denies blurring,significant itching ENT: Denies earache, sore throat, and hoarseness. Cardiovascular: Denies chest pains, palpitations, dyspnea on exertion Respiratory: Denies cough, dyspnea at rest,wheeezing Breast:  no concerns about lumps GI: Denies nausea, vomiting, diarrhea, constipation, change in bowel habits, abdominal pain, melena, hematochezia GU: Denies penile discharge, ED, urinary flow / outflow problems. No STD concerns. Musculoskeletal: Denies back pain, joint pain Derm: Denies rash, itching Neuro: Denies  paresthesias, frequent falls, frequent headaches Psych: Denies depression, anxiety Endocrine: Denies cold intolerance, heat intolerance, polydipsia Heme: Denies enlarged lymph nodes Allergy: No  hayfever  Objective:   BP 120/74 mmHg  Pulse 71  Temp(Src) 97.6 F (36.4 C) (Oral)  Ht 5' 8"  (1.727 m)  Wt 177 lb 8 oz (80.513 kg)  BMI 26.99 kg/m2  The patient completed a fall screen and PHQ-2 and PHQ-9 if necessary, which is documented in the EHR. The CMA/LPN/RN who assisted the patient verbally completed with them and documented results in [facility].   Hearing Screening   Method: Audiometry   125Hz  250Hz  500Hz  1000Hz  2000Hz  4000Hz  8000Hz   Right ear:   20 40 40 0   Left ear:   20 40 40 0     Visual Acuity Screening   Right eye Left eye Both eyes  Without correction: 20/50 20/15 20/15   With correction:       GEN: well developed, well nourished, no acute distress Eyes: conjunctiva and lids normal, PERRLA, EOMI ENT: TM clear, nares clear, oral exam WNL Neck: supple, no lymphadenopathy, no thyromegaly, no JVD Pulm: clear to auscultation and percussion, respiratory effort normal CV: regular rate and rhythm, S1-S2, no murmur, rub or gallop, no bruits, peripheral pulses normal and symmetric, no cyanosis, clubbing, edema or varicosities GI: soft, non-tender; no hepatosplenomegaly, masses; active bowel sounds all quadrants GU: no hernia, testicular mass, penile discharge Lymph: no cervical, axillary or inguinal adenopathy MSK: gait normal, muscle tone and strength WNL, no joint swelling, effusions, discoloration, crepitus  SKIN: clear, good turgor, color WNL, no rashes, lesions, or ulcerations Neuro: normal mental status, normal strength, sensation, and motion Psych: alert; oriented to person, place and time, normally interactive and not anxious or depressed in appearance.  All labs reviewed with patient.  Lipids:    Component Value Date/Time   CHOL 209* 04/28/2015 0820   TRIG 172.0* 04/28/2015 0820   HDL 34.50* 04/28/2015 0820   LDLDIRECT 162.7 08/02/2010 0824   VLDL 34.4 04/28/2015 0820   CHOLHDL 6 04/28/2015 0820   CBC: CBC Latest Ref Rng 04/28/2015 11/20/2013  01/29/2013  WBC 4.0 - 10.5 K/uL 5.4 4.2(L) -  Hemoglobin 13.0 - 17.0 g/dL 15.6 15.4 15.1  Hematocrit 39.0 - 52.0 % 47.2 45.6 -  Platelets 150.0 - 400.0 K/uL 190.0 188.0 -    Basic Metabolic Panel:    Component Value Date/Time   NA 140 04/28/2015 0820   K 4.9 04/28/2015 0820   CL 104 04/28/2015 0820   CO2 30 04/28/2015 0820   BUN 15 04/28/2015 0820   CREATININE 0.89 04/28/2015 0820   GLUCOSE 99 04/28/2015 0820   CALCIUM 9.4 04/28/2015 0820   Hepatic Function Latest Ref Rng 04/28/2015 11/20/2013 11/13/2011  Total Protein 6.0 - 8.3 g/dL 6.8 6.7 6.9  Albumin 3.5 - 5.2 g/dL 4.2 4.3 3.9  AST 0 - 37 U/L 24 33 23  ALT 0 - 53 U/L 27 30 25   Alk Phosphatase 39 - 117 U/L 70 61 63  Total Bilirubin 0.2 - 1.2 mg/dL 0.6 1.0 0.7  Bilirubin, Direct 0.0 - 0.3 mg/dL 0.1 0.1 0.1    No results found for: TSH Lab Results  Component Value Date   PSA 0.01*  04/28/2015   PSA 0.01* 11/20/2013   PSA 0.01* 11/13/2011    Assessment and Plan:   Routine general medical examination at a health care facility  Need for prophylactic vaccination against Streptococcus pneumoniae (pneumococcus) - Plan: Pneumococcal polysaccharide vaccine 23-valent greater than or equal to 2yo subcutaneous/IM  Need for prophylactic vaccination and inoculation against influenza - Plan: Flu Vaccine QUAD 36+ mos IM  Jock itch - Plan: terbinafine (LAMISIL) 1 % cream  Health Maintenance Exam: The patient's preventative maintenance and recommended screening tests for an annual wellness exam were reviewed in full today. Brought up to date unless services declined.  Counselled on the importance of diet, exercise, and its role in overall health and mortality. The patient's FH and SH was reviewed, including their home life, tobacco status, and drug and alcohol status.  I have personally reviewed the Medicare Annual Wellness questionnaire and have noted 1. The patient's medical and social history 2. Their use of alcohol, tobacco or  illicit drugs 3. Their current medications and supplements 4. The patient's functional ability including ADL's, fall risks, home safety risks and hearing or visual             impairment. 5. Diet and physical activities 6. Evidence for depression or mood disorders 7. Reviewed Updated provider list, see scanned forms and [facility] Snapshot.   The patients weight, height, BMI and visual acuity have been recorded in the chart I have made referrals, counseling and provided education to the patient based review of the above and I have provided the pt with a written personalized care plan for preventive services.  I have provided the patient with a copy of your personalized plan for preventive services. Instructed to take the time to review along with their updated medication list.  Overall, doing great.  Follow-up: No Follow-up on file. Or follow-up in 1 year for complete physical examination  New Prescriptions   No medications on file   Orders Placed This Encounter  Procedures  . Pneumococcal polysaccharide vaccine 23-valent greater than or equal to 2yo subcutaneous/IM  . Flu Vaccine QUAD 36+ mos IM    Signed,  Treg Diemer T. Isma Tietje, MD   Patient's Medications  New Prescriptions   No medications on file  Previous Medications   ASPIRIN 81 MG TABLET    Take 81 mg by mouth daily.     B COMPLEX VITAMINS TABLET    Take 1 tablet by mouth daily.   CELECOXIB (CELEBREX) 200 MG CAPSULE    Take 200 mg by mouth daily.     CYANOCOBALAMIN 2000 MCG TABLET    Take 2,000 mcg by mouth daily.   OMEGA-3 FATTY ACIDS (FISH OIL) 300 MG CAPS    Take 1 capsule by mouth daily.   OMEPRAZOLE (PRILOSEC) 40 MG CAPSULE    Take 40 mg by mouth daily.   RED YEAST RICE 600 MG CAPS    Take 1 capsule by mouth daily.     VITAMIN E (VITAMIN E) 400 UNIT CAPSULE    Take 400 Units by mouth daily.  Modified Medications   Modified Medication Previous Medication   TERBINAFINE (LAMISIL) 1 % CREAM terbinafine (LAMISIL) 1 % cream       Apply topically 2 (two) times daily.    Apply topically 2 (two) times daily.   ZOSTER VACCINE LIVE, PF, (ZOSTAVAX) 53664 UNT/0.65ML INJECTION zoster vaccine live, PF, (ZOSTAVAX) 40347 UNT/0.65ML injection      Inject 19,400 Units into the skin once.    Inject 19,400  Units into the skin once.  Discontinued Medications   No medications on file

## 2015-05-05 NOTE — Progress Notes (Signed)
Pre visit review using our clinic review tool, if applicable. No additional management support is needed unless otherwise documented below in the visit note. 

## 2015-08-10 DIAGNOSIS — G5601 Carpal tunnel syndrome, right upper limb: Secondary | ICD-10-CM | POA: Diagnosis not present

## 2015-09-01 DIAGNOSIS — G5601 Carpal tunnel syndrome, right upper limb: Secondary | ICD-10-CM | POA: Diagnosis not present

## 2015-12-15 ENCOUNTER — Telehealth: Payer: Self-pay | Admitting: Family Medicine

## 2015-12-15 NOTE — Telephone Encounter (Signed)
I spoke with pt and now his CP is pain level one; but for the past 2 days pt has had the CP that radiates into the lt neck and has numbness in lt side of face that comes and goes. Advised pt he does need to be evaluated today. No available appts at [facility] or [facility]. Pt said he does not want to go to ED to waste his time and his money. Please advise. DrCopland out of office; will send note to Dr Damita Dunnings.

## 2015-12-15 NOTE — Telephone Encounter (Signed)
Patient Name: Scott [name] DOB: [birth year] Initial Comment Caller states he has upper abdominal and chest pressure, occasional chest pain, feels tired and sluggish Nurse Assessment Nurse: Vallery Sa, RN, Cathy Date/Time (Eastern Time): 12/15/2015 12:26:13 PM Confirm and document reason for call. If symptomatic, describe symptoms. You must click the next button to save text entered. ---Caller states he developed upper abdominal and chest pressure about a month ago and chest pain yesterday (rated as a 4/5 on the 1 to 10 scale). He has felt tired the past year. No severe breathing difficulty. No fever. No injury in the past week. No fever. Alert and responsive. Has the patient traveled out of the country within the last 30 days? ---No Does the patient have any new or worsening symptoms? ---Yes Will a triage be completed? ---Yes Related visit to physician within the last 2 weeks? ---No Does the PT have any chronic conditions? (i.e. diabetes, asthma, etc.) ---Yes List chronic conditions. ---Stomach problems, Hiatal Hernia Is this a behavioral health or substance abuse call? ---No Guidelines Guideline Title Affirmed Question Affirmed Notes Chest Pain Taking a deep breath makes pain worse Final Disposition User Go to ED Now (or PCP triage) Vallery Sa, RN, Cathy Comments Caller states he has pain when he takes a deep breath in. He declined the Go to ER disposition. Reinforced the Go to ER disposition. Called the office backline and notified [facility] who states they will call him back. [name] asks that the MD or MD's nurse call him back at the [phone] number as he would like to be seen at the office rather than the ER. Referrals GO TO FACILITY REFUSED Disagree/Comply: Comply

## 2015-12-15 NOTE — Telephone Encounter (Signed)
I notified pt as instructed; pt understands the recommendation is for pt to go to ED now and he voiced understanding. Pt first said if he passed out he would go to ED; after further discussion pt said if his pain worsened or the pain radiated into his neck or had numbness in his face he would go to ED. Pt did schedule appt on 12/16/15 at 11:30 with Avie Echevaria NP.

## 2015-12-15 NOTE — Telephone Encounter (Signed)
Advise ER- he can get OV scheduled but that doesn't replace the need for ER today.  Thanks.

## 2015-12-16 ENCOUNTER — Encounter: Payer: Self-pay | Admitting: Internal Medicine

## 2015-12-16 ENCOUNTER — Ambulatory Visit (INDEPENDENT_AMBULATORY_CARE_PROVIDER_SITE_OTHER): Payer: Medicare Other | Admitting: Internal Medicine

## 2015-12-16 VITALS — BP 122/78 | HR 66 | Temp 97.9°F | Wt 179.0 lb

## 2015-12-16 DIAGNOSIS — R1013 Epigastric pain: Secondary | ICD-10-CM | POA: Diagnosis not present

## 2015-12-16 DIAGNOSIS — R0789 Other chest pain: Secondary | ICD-10-CM | POA: Diagnosis not present

## 2015-12-16 DIAGNOSIS — K219 Gastro-esophageal reflux disease without esophagitis: Secondary | ICD-10-CM | POA: Diagnosis not present

## 2015-12-16 LAB — COMPREHENSIVE METABOLIC PANEL
ALBUMIN: 4.2 g/dL (ref 3.5–5.2)
ALK PHOS: 56 U/L (ref 39–117)
ALT: 26 U/L (ref 0–53)
AST: 25 U/L (ref 0–37)
BILIRUBIN TOTAL: 0.6 mg/dL (ref 0.2–1.2)
BUN: 14 mg/dL (ref 6–23)
CO2: 28 mEq/L (ref 19–32)
CREATININE: 0.89 mg/dL (ref 0.40–1.50)
Calcium: 9.3 mg/dL (ref 8.4–10.5)
Chloride: 106 mEq/L (ref 96–112)
GFR: 89.11 mL/min (ref >60.00)
GLUCOSE: 109 mg/dL — AB (ref 70–99)
Potassium: 4.2 mEq/L (ref 3.5–5.1)
SODIUM: 140 meq/L (ref 135–145)
TOTAL PROTEIN: 7.1 g/dL (ref 6.0–8.3)

## 2015-12-16 LAB — CBC
HCT: 45.5 % (ref 39.0–52.0)
Hemoglobin: 15 g/dL (ref 13.0–17.0)
MCHC: 33 g/dL (ref 30.0–36.0)
MCV: 87.3 fl (ref 78.0–100.0)
Platelets: 199 10*3/uL (ref 150.0–400.0)
RBC: 5.21 Mil/uL (ref 4.22–5.81)
RDW: 14.4 % (ref 11.5–15.5)
WBC: 4.7 10*3/uL (ref 4.0–10.5)

## 2015-12-16 LAB — H. PYLORI ANTIBODY, IGG: H Pylori IgG: NEGATIVE

## 2015-12-16 LAB — AMYLASE: Amylase: 48 U/L (ref 27–131)

## 2015-12-16 LAB — LIPASE: Lipase: 27 U/L (ref 11.0–59.0)

## 2015-12-16 MED ORDER — OMEPRAZOLE 40 MG PO CPDR: 40.0000 mg | DELAYED_RELEASE_CAPSULE | Freq: Every day | ORAL | Status: DC

## 2015-12-16 NOTE — Patient Instructions (Signed)
Nonspecific Chest Pain  °Chest pain can be caused by many different conditions. There is always a chance that your pain could be related to something serious, such as a heart attack or a blood clot in your lungs. Chest pain can also be caused by conditions that are not life-threatening. If you have chest pain, it is very important to follow up with your health care provider. °CAUSES  °Chest pain can be caused by: °· Heartburn. °· Pneumonia or bronchitis. °· Anxiety or stress. °· Inflammation around your heart (pericarditis) or lung (pleuritis or pleurisy). °· A blood clot in your lung. °· A collapsed lung (pneumothorax). It can develop suddenly on its own (spontaneous pneumothorax) or from trauma to the chest. °· Shingles infection (varicella-zoster virus). °· Heart attack. °· Damage to the bones, muscles, and cartilage that make up your chest wall. This can include: °¨ Bruised bones due to injury. °¨ Strained muscles or cartilage due to frequent or repeated coughing or overwork. °¨ Fracture to one or more ribs. °¨ Sore cartilage due to inflammation (costochondritis). °RISK FACTORS  °Risk factors for chest pain may include: °· Activities that increase your risk for trauma or injury to your chest. °· Respiratory infections or conditions that cause frequent coughing. °· Medical conditions or overeating that can cause heartburn. °· Heart disease or family history of heart disease. °· Conditions or health behaviors that increase your risk of developing a blood clot. °· Having had chicken pox (varicella zoster). °SIGNS AND SYMPTOMS °Chest pain can feel like: °· Burning or tingling on the surface of your chest or deep in your chest. °· Crushing, pressure, aching, or squeezing pain. °· Dull or sharp pain that is worse when you move, cough, or take a deep breath. °· Pain that is also felt in your back, neck, shoulder, or arm, or pain that spreads to any of these areas. °Your chest pain may come and go, or it may stay  constant. °DIAGNOSIS °Lab tests or other studies may be needed to find the cause of your pain. Your health care provider may have you take a test called an ambulatory ECG (electrocardiogram). An ECG records your heartbeat patterns at the time the test is performed. You may also have other tests, such as: °· Transthoracic echocardiogram (TTE). During echocardiography, sound waves are used to create a picture of all of the heart structures and to look at how blood flows through your heart. °· Transesophageal echocardiogram (TEE). This is a more advanced imaging test that obtains images from inside your body. It allows your health care provider to see your heart in finer detail. °· Cardiac monitoring. This allows your health care provider to monitor your heart rate and rhythm in real time. °· Holter monitor. This is a portable device that records your heartbeat and can help to diagnose abnormal heartbeats. It allows your health care provider to track your heart activity for several days, if needed. °· Stress tests. These can be done through exercise or by taking medicine that makes your heart beat more quickly. °· Blood tests. °· Imaging tests. °TREATMENT  °Your treatment depends on what is causing your chest pain. Treatment may include: °· Medicines. These may include: °¨ Acid blockers for heartburn. °¨ Anti-inflammatory medicine. °¨ Pain medicine for inflammatory conditions. °¨ Antibiotic medicine, if an infection is present. °¨ Medicines to dissolve blood clots. °¨ Medicines to treat coronary artery disease. °· Supportive care for conditions that do not require medicines. This may include: °¨ Resting. °¨ Applying heat   or cold packs to injured areas. °¨ Limiting activities until pain decreases. °HOME CARE INSTRUCTIONS °· If you were prescribed an antibiotic medicine, finish it all even if you start to feel better. °· Avoid any activities that bring on chest pain. °· Do not use any tobacco products, including  cigarettes, chewing tobacco, or electronic cigarettes. If you need help quitting, ask your health care provider. °· Do not drink alcohol. °· Take medicines only as directed by your health care provider. °· Keep all follow-up visits as directed by your health care provider. This is important. This includes any further testing if your chest pain does not go away. °· If heartburn is the cause for your chest pain, you may be told to keep your head raised (elevated) while sleeping. This reduces the chance that acid will go from your stomach into your esophagus. °· Make lifestyle changes as directed by your health care provider. These may include: °¨ Getting regular exercise. Ask your health care provider to suggest some activities that are safe for you. °¨ Eating a heart-healthy diet. A registered dietitian can help you to learn healthy eating options. °¨ Maintaining a healthy weight. °¨ Managing diabetes, if necessary. °¨ Reducing stress. °SEEK MEDICAL CARE IF: °· Your chest pain does not go away after treatment. °· You have a rash with blisters on your chest. °· You have a fever. °SEEK IMMEDIATE MEDICAL CARE IF:  °· Your chest pain is worse. °· You have an increasing cough, or you cough up blood. °· You have severe abdominal pain. °· You have severe weakness. °· You faint. °· You have chills. °· You have sudden, unexplained chest discomfort. °· You have sudden, unexplained discomfort in your arms, back, neck, or jaw. °· You have shortness of breath at any time. °· You suddenly start to sweat, or your skin gets clammy. °· You feel nauseous or you vomit. °· You suddenly feel light-headed or dizzy. °· Your heart begins to beat quickly, or it feels like it is skipping beats. °These symptoms may represent a serious problem that is an emergency. Do not wait to see if the symptoms will go away. Get medical help right away. Call your local emergency services (911 in the [place] Do not drive yourself to the hospital. °  °This  information is not intended to replace advice given to you by your health care provider. Make sure you discuss any questions you have with your health care provider. °  °Document Released: 05/17/2005 Document Revised: 08/28/2014 Document Reviewed: 03/13/2014 °[employer]vier Interactive Patient Education ©2016 [employer] ° °

## 2015-12-16 NOTE — Progress Notes (Signed)
Subjective:    Patient ID: Scott [name], male    DOB: [birth year], 75 y.o.   MRN: [id number]  HPI  Pt presents to the clinic today with c/o intermittent chest/epigastric. This started 4-5 months ago. He reports that the pain comes and goes without cause. It subsides without intervention. He denies tachycardia, fluttering, nausea or diaphoresis with the pain. There is no association of the pain with meals, position or time. He denies pain with movement or deep breaths. He exercises regularly, and cannot replicate the pain with exercise. He has a h/o GERD, for which he take Prilosec, but states the pain does not feel like reflux. He has a h/o hiatal hernia, which has not been evaluated in long time. 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 Previous ECG (10/2011) reviewed.  Additionally, pt would like a refill of his Prilosec. He has a few days of medicine left.    Review of Systems   Past Medical History  Diagnosis Date  . Allergic rhinitis due to pollen   . GERD (gastroesophageal reflux disease)   . Hyperlipidemia   . Arthritis   . Prostate cancer ([place])     cancer  . Right bundle branch block     RBBB    Current Outpatient Prescriptions  Medication Sig Dispense Refill  . aspirin 81 MG tablet Take 81 mg by mouth daily.      Marland Kitchen b complex vitamins tablet Take 1 tablet by mouth daily.    . celecoxib (CELEBREX) 200 MG capsule Take 200 mg by mouth daily.      . cyanocobalamin 2000 MCG tablet Take 2,000 mcg by mouth daily.    Marland Kitchen omeprazole (PRILOSEC) 40 MG capsule Take 1 capsule (40 mg total) by mouth daily. 30 capsule 11  . Red Yeast Rice 600 MG CAPS Take 1 capsule by mouth daily.      Marland Kitchen terbinafine (LAMISIL) 1 % cream Apply topically 2 (two) times daily. 30 g 2  . vitamin E (VITAMIN E) 400 UNIT capsule Take 400 Units by mouth daily.    Marland Kitchen zoster vaccine live, PF, (ZOSTAVAX) 16109 UNT/0.65ML injection Inject 19,400 Units into the skin once. 1 each 0   No current  facility-administered medications for this visit.    No Known Allergies  Family History  Problem Relation Age of Onset  . Cancer Father     prostate  . Cancer Brother     prostate  . Cancer Brother     prostate    Social History   Social History  . Marital Status: Married    Spouse Name: N/A  . Number of Children: N/A  . Years of Education: N/A   Occupational History  . auctioneer    Social History Main Topics  . Smoking status: Never Smoker   . Smokeless tobacco: Never Used  . Alcohol Use: No  . Drug Use: No  . Sexual Activity: Not on file   Other Topics Concern  . Not on file   Social History Narrative   Former competitive bodybuilder, Geophysicist/field seismologist, all natural   [employer], [place], [employer], multiple years     Constitutional: Denies fever, fatigue, headache or abrupt weight changes.  Respiratory: Denies difficulty breathing, shortness of breath, cough or sputum production.   Cardiovascular: Intermittent chest/epigastric pain. Denies chest tightness, palpitations or swelling in the hands or feet. Denies diaphorsis Gastrointestinal: Pt reports reflux. Denies constipation, diarrhea or blood in the stool.  Neurological: Denies dizziness,  difficulty with speech, problems with balance.  No other specific complaints in a complete review of systems (except as listed in HPI above).     Objective:   Physical Exam  BP 122/78 mmHg  Pulse 66  Temp(Src) 97.9 F (36.6 C) (Oral)  Wt 179 lb (81.194 kg)  SpO2 97% Wt Readings from Last 3 Encounters:  12/16/15 179 lb (81.194 kg)  05/05/15 177 lb 8 oz (80.513 kg)  11/26/13 177 lb 8 oz (80.513 kg)    General: Appears his stated age, well developed, well nourished in NAD. Skin: Warm, dry and intact.  Cardiovascular: Normal rate and rhythm. S1,S2 noted.  No murmur, rubs or gallops noted. No JVD or BLE edema. No carotid bruits noted. Pulmonary/Chest: Normal effort and positive vesicular breath sounds. No  respiratory distress. No wheezes, rales or ronchi noted.  Abdomen: Soft and nontender. Normal bowel sounds. No distention or masses noted.  MSK: Pain not reproduced with palpation.  EKG: 1st degree A-V block and RBBB. No signs of ST elevation. No signs of ischemia.   BMET    Component Value Date/Time   NA 140 04/28/2015 0820   K 4.9 04/28/2015 0820   CL 104 04/28/2015 0820   CO2 30 04/28/2015 0820   GLUCOSE 99 04/28/2015 0820   BUN 15 04/28/2015 0820   CREATININE 0.89 04/28/2015 0820   CALCIUM 9.4 04/28/2015 0820   GFRNONAA 74.85 04/28/2010 1426    Lipid Panel     Component Value Date/Time   CHOL 209* 04/28/2015 0820   TRIG 172.0* 04/28/2015 0820   HDL 34.50* 04/28/2015 0820   CHOLHDL 6 04/28/2015 0820   VLDL 34.4 04/28/2015 0820   LDLCALC 140* 04/28/2015 0820    CBC    Component Value Date/Time   WBC 5.4 04/28/2015 0820   RBC 5.30 04/28/2015 0820   HGB 15.6 04/28/2015 0820   HCT 47.2 04/28/2015 0820   PLT 190.0 04/28/2015 0820   MCV 89.1 04/28/2015 0820   MCHC 33.2 04/28/2015 0820   RDW 14.7 04/28/2015 0820   LYMPHSABS 1.1 04/28/2015 0820   MONOABS 0.6 04/28/2015 0820   EOSABS 0.2 04/28/2015 0820   BASOSABS 0.0 04/28/2015 0820    Hgb A1C No results found for: HGBA1C      Assessment & Plan:   Epigastric pain/vhest pressure:  ECG unchanged from prior- does not seem cardiac related Check CBC, CMET, lipase, amylase, H. Pylori If labs are negative, refer to GI to assess hiatal hernia  Refill Prilosec today  Will follow up after labs, RTC as needed

## 2016-03-24 ENCOUNTER — Telehealth: Payer: Self-pay | Admitting: Family Medicine

## 2016-03-24 NOTE — Telephone Encounter (Signed)
LM for pt to sch AWV and CPE, mn °

## 2016-04-20 DIAGNOSIS — M25562 Pain in left knee: Secondary | ICD-10-CM | POA: Diagnosis not present

## 2016-05-20 DIAGNOSIS — Z23 Encounter for immunization: Secondary | ICD-10-CM | POA: Diagnosis not present

## 2016-07-28 ENCOUNTER — Other Ambulatory Visit: Payer: Self-pay

## 2016-08-02 ENCOUNTER — Other Ambulatory Visit (INDEPENDENT_AMBULATORY_CARE_PROVIDER_SITE_OTHER): Payer: Medicare Other

## 2016-08-02 DIAGNOSIS — E784 Other hyperlipidemia: Secondary | ICD-10-CM

## 2016-08-02 DIAGNOSIS — Z79899 Other long term (current) drug therapy: Secondary | ICD-10-CM

## 2016-08-02 DIAGNOSIS — Z8546 Personal history of malignant neoplasm of prostate: Secondary | ICD-10-CM | POA: Diagnosis not present

## 2016-08-02 DIAGNOSIS — E7849 Other hyperlipidemia: Secondary | ICD-10-CM

## 2016-08-02 LAB — CBC WITH DIFFERENTIAL/PLATELET
BASOS ABS: 0 10*3/uL (ref 0.0–0.1)
BASOS PCT: 0.5 % (ref 0.0–3.0)
Eosinophils Absolute: 0.2 10*3/uL (ref 0.0–0.7)
Eosinophils Relative: 3.5 % (ref 0.0–5.0)
HEMATOCRIT: 44.8 % (ref 39.0–52.0)
Hemoglobin: 14.8 g/dL (ref 13.0–17.0)
LYMPHS PCT: 20.4 % (ref 12.0–46.0)
Lymphs Abs: 1 10*3/uL (ref 0.7–4.0)
MCHC: 33.1 g/dL (ref 30.0–36.0)
MCV: 88.6 fl (ref 78.0–100.0)
MONOS PCT: 11 % (ref 3.0–12.0)
Monocytes Absolute: 0.6 10*3/uL (ref 0.1–1.0)
NEUTROS ABS: 3.3 10*3/uL (ref 1.4–7.7)
Neutrophils Relative %: 64.6 % (ref 43.0–77.0)
PLATELETS: 193 10*3/uL (ref 150.0–400.0)
RBC: 5.06 Mil/uL (ref 4.22–5.81)
RDW: 14.8 % (ref 11.5–15.5)
WBC: 5.1 10*3/uL (ref 4.0–10.5)

## 2016-08-02 LAB — LIPID PANEL
CHOL/HDL RATIO: 5
Cholesterol: 191 mg/dL (ref 0–200)
HDL: 40.7 mg/dL (ref >39.00)
LDL Cholesterol: 135 mg/dL — ABNORMAL HIGH (ref 0–99)
NONHDL: 150.64
Triglycerides: 80 mg/dL (ref 0.0–149.0)
VLDL: 16 mg/dL (ref 0.0–40.0)

## 2016-08-02 LAB — HEPATIC FUNCTION PANEL
ALBUMIN: 4.4 g/dL (ref 3.5–5.2)
ALT: 25 U/L (ref 0–53)
AST: 28 U/L (ref 0–37)
Alkaline Phosphatase: 62 U/L (ref 39–117)
Bilirubin, Direct: 0.1 mg/dL (ref 0.0–0.3)
Total Bilirubin: 0.7 mg/dL (ref 0.2–1.2)
Total Protein: 6.7 g/dL (ref 6.0–8.3)

## 2016-08-02 LAB — BASIC METABOLIC PANEL
BUN: 18 mg/dL (ref 6–23)
CO2: 29 meq/L (ref 19–32)
Calcium: 9.3 mg/dL (ref 8.4–10.5)
Chloride: 108 mEq/L (ref 96–112)
Creatinine, Ser: 0.92 mg/dL (ref 0.40–1.50)
GFR: 85.62 mL/min (ref >60.00)
Glucose, Bld: 111 mg/dL — ABNORMAL HIGH (ref 70–99)
Potassium: 4.7 mEq/L (ref 3.5–5.1)
SODIUM: 142 meq/L (ref 135–145)

## 2016-08-02 NOTE — Addendum Note (Signed)
Addended by: Ellamae Sia on: 08/02/2016 02:39 PM   Modules accepted: Orders

## 2016-08-03 LAB — PSA, TOTAL WITH REFLEX TO PSA, FREE

## 2016-08-10 ENCOUNTER — Ambulatory Visit (INDEPENDENT_AMBULATORY_CARE_PROVIDER_SITE_OTHER): Payer: Medicare Other | Admitting: Family Medicine

## 2016-08-10 ENCOUNTER — Encounter: Payer: Self-pay | Admitting: Family Medicine

## 2016-08-10 VITALS — BP 140/88 | HR 72 | Ht 68.0 in | Wt 176.0 lb

## 2016-08-10 DIAGNOSIS — L820 Inflamed seborrheic keratosis: Secondary | ICD-10-CM | POA: Diagnosis not present

## 2016-08-10 DIAGNOSIS — Z0000 Encounter for general adult medical examination without abnormal findings: Secondary | ICD-10-CM

## 2016-08-10 NOTE — Progress Notes (Signed)
Dr. Frederico Hamman T. Clyda Smyth, MD, [facility] [place], [address] Phone: [phone] Fax: [phone]  08/10/2016  Patient: Scott [name], MRN: [id number], DOB: 1941/05/24, 75 y.o.  Primary Physician:  Owens Loffler, MD   Chief Complaint  Patient presents with  . Medicare Wellness    some chest discomfort, some chest pain off and on.    Subjective:   Scott [name] is a 75 y.o. pleasant patient who presents for a medicare wellness examination:  Preventative Health Maintenance Visit:  Health Maintenance Summary Reviewed and updated, unless pt declines services.  Tobacco History Reviewed. Alcohol: No concerns, no excessive use Exercise Habits: Daily working out - can still do 100 pushups straight STD concerns: no risk or activity to increase risk Drug Use: None Encouraged self-testicular check  Health Maintenance  Topic Date Due  . TETANUS/TDAP  03/11/1962  . ZOSTAVAX  03/12/2003  . COLONOSCOPY  01/20/2019  . INFLUENZA VACCINE  Completed  . PNA vac Low Risk Adult  Completed    Immunization History  Administered Date(s) Administered  . Influenza Split 06/13/2011  . Influenza,inj,Quad PF,36+ Mos 05/05/2015  . Influenza-Unspecified 06/01/2014  . Pneumococcal Conjugate-13 11/26/2013  . Pneumococcal Polysaccharide-23 05/05/2015    Patient Active Problem List   Diagnosis Date Noted  . Prostate cancer, in remission   . Right bundle branch block   . AV BLOCK, 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  . HYPERLIPIDEMIA 04/28/2010  . ALLERGIC RHINITIS 04/28/2010  . GERD 04/28/2010  . 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 Allergic rhinitis due to pollen   . Arthritis   . GERD (gastroesophageal reflux disease)   . Hyperlipidemia   . Prostate cancer ([place])    cancer  . Right bundle branch block    RBBB   Past Surgical History:  Procedure Laterality  Date  . APPENDECTOMY    . HERNIA REPAIR  85,03   rt and lt ing hernia  . KNEE ARTHROSCOPY  2012   left  . prostatectomy  1998  . ROTATOR CUFF REPAIR     right  . SHOULDER ARTHROSCOPY Right 01/29/2013   Procedure: RIGHT ARTHROSCOPY SHOULDER WITH DEBRIDEMENT, POSSIBLE ROTATOR CUFF REPAIR;  Surgeon: Kerin Salen, MD;  Location: [facility];  Service: Orthopedics;  Laterality: Right;  . TONSILLECTOMY    . TOTAL HIP ARTHROPLASTY  2007   right    Social History   Social History  . Marital status: Married    Spouse name: N/A  . Number of children: N/A  . Years of education: N/A   Occupational History  . auctioneer Retired   Social History Main Topics  . Smoking status: Never Smoker  . Smokeless tobacco: Never Used  . Alcohol use No  . Drug use: No  . Sexual activity: Not on file   Other Topics Concern  . Not on file   Social History Narrative   Former competitive bodybuilder, Geophysicist/field seismologist, all natural   [employer], [place], [employer], multiple years   Family History  Problem Relation Age of Onset  . Cancer Father     prostate  . Cancer Brother     prostate  . Cancer Brother     prostate   No Known Allergies  Medication list has been reviewed and updated.   General: Denies fever, chills, sweats. No significant weight loss. Eyes: Denies blurring,significant itching ENT: Denies earache, sore  throat, and hoarseness. Cardiovascular: Denies chest pains, palpitations, dyspnea on exertion Respiratory: Denies cough, dyspnea at rest,wheeezing Breast: no concerns about lumps GI: Denies nausea, vomiting, diarrhea, constipation, change in bowel habits, abdominal pain, melena, hematochezia GU: Denies penile discharge, ED, urinary flow / outflow problems. No STD concerns. Musculoskeletal: Denies back pain, joint pain Derm: Denies rash, itching. SEB K IRRITATED L TEMPLE Neuro: Denies  paresthesias, frequent falls, frequent headaches Psych: Denies  depression, anxiety Endocrine: Denies cold intolerance, heat intolerance, polydipsia Heme: Denies enlarged lymph nodes Allergy: No hayfever  Objective:   BP 140/88   Pulse 72   Ht _0  (1.727 m)   Wt 176 lb (79.8 kg)   SpO2 96%   BMI 26.76 kg/m   The patient completed a fall screen and PHQ-2 and PHQ-9 if necessary, which is documented in the EHR. The CMA/LPN/RN who assisted the patient verbally completed with them and documented results in [place].   Hearing Screening   _1  _2  _3  _4  _5  _6  _7  _8  _9   Right ear:    40  40  0    Left ear:    40 40  0      Visual Acuity Screening   Right eye Left eye Both eyes  Without correction: 20/30 20/20  20/15   With correction:       GEN: well developed, well nourished, no acute distress Eyes: conjunctiva and lids normal, PERRLA, EOMI ENT: TM clear, nares clear, oral exam WNL Neck: supple, no lymphadenopathy, no thyromegaly, no JVD Pulm: clear to auscultation and percussion, respiratory effort normal CV: regular rate and rhythm, S1-S2, no murmur, rub or gallop, no bruits, peripheral pulses normal and symmetric, no cyanosis, clubbing, edema or varicosities GI: soft, non-tender; no hepatosplenomegaly, masses; active bowel sounds all quadrants GU: no hernia, testicular mass, penile discharge Lymph: no cervical, axillary or inguinal adenopathy MSK: gait normal, muscle tone and strength WNL, no joint swelling, effusions, discoloration, crepitus  SKIN: clear, good turgor, color WNL, L TEMPLE LARGE IRRITATED SEB K Neuro: normal mental status, normal strength, sensation, and motion Psych: alert; oriented to person, place and time, normally interactive and not anxious or depressed in appearance.  All labs reviewed with patient.  Lipids:    Component Value Date/Time   CHOL 191 08/02/2016 1434   TRIG 80.0 08/02/2016 1434   HDL 40.70 08/02/2016 1434   LDLDIRECT 162.7 08/02/2010 0824   VLDL 16.0  08/02/2016 1434   CHOLHDL 5 08/02/2016 1434   CBC: CBC Latest Ref Rng & Units 08/02/2016 12/16/2015 04/28/2015  WBC 4.0 - 10.5 K/uL 5.1 4.7 5.4  Hemoglobin 13.0 - 17.0 g/dL 14.8 15.0 15.6  Hematocrit 39.0 - 52.0 % 44.8 45.5 47.2  Platelets 150.0 - 400.0 K/uL 193.0 199.0 734.2    Basic Metabolic Panel:    Component Value Date/Time   NA 142 08/02/2016 1434   K 4.7 08/02/2016 1434   CL 108 08/02/2016 1434   CO2 29 08/02/2016 1434   BUN 18 08/02/2016 1434   CREATININE 0.92 08/02/2016 1434   GLUCOSE 111 (H) 08/02/2016 1434   CALCIUM 9.3 08/02/2016 1434   Hepatic Function Latest Ref Rng & Units 08/02/2016 12/16/2015 04/28/2015  Total Protein 6.0 - 8.3 g/dL 6.7 7.1 6.8  Albumin 3.5 - 5.2 g/dL 4.4 4.2 4.2  AST 0 - 37 U/L _10 ALT 0 - 53 U/L _11 Alk Phosphatase 39 - 117 U/L 62 56 70  Total Bilirubin 0.2 -  1.2 mg/dL 0.7 0.6 0.6  Bilirubin, Direct 0.0 - 0.3 mg/dL 0.1 - 0.1    No results found for: TSH Lab Results  Component Value Date   PSA 0.01 (L) 04/28/2015   PSA 0.01 (L) 11/20/2013   PSA 0.01 (L) 11/13/2011    Assessment and Plan:   Healthcare maintenance  Seborrheic keratosis, inflamed  Cryotherapy  Reason: Irritated Seb Keratosis on L temple Location: some irritation and occaisional pain  Liquid nitrogen was applied using the liquid nitrogen gun without difficulty with an otoscope tip for concentration. Tolerated well without complications.   Health Maintenance Exam: The patient's preventative maintenance and recommended screening tests for an annual wellness exam were reviewed in full today. Brought up to date unless services declined.  Counselled on the importance of diet, exercise, and its role in overall health and mortality. The patient's FH and SH was reviewed, including their home life, tobacco status, and drug and alcohol status.  Follow-up in 1 year for physical exam or additional follow-up below.  I have personally reviewed the Medicare Annual  Wellness questionnaire and have noted 1. The patient's medical and social history 2. Their use of alcohol, tobacco or illicit drugs 3. Their current medications and supplements 4. The patient's functional ability including ADL's, fall risks, home safety risks and hearing or visual             impairment. 5. Diet and physical activities 6. Evidence for depression or mood disorders 7. Reviewed Updated provider list, see scanned forms and [facility] Snapshot.   The patients weight, height, BMI and visual acuity have been recorded in the chart I have made referrals, counseling and provided education to the patient based review of the above and I have provided the pt with a written personalized care plan for preventive services.  I have provided the patient with a copy of your personalized plan for preventive services. Instructed to take the time to review along with their updated medication list.  Follow-up: No Follow-up on file. Or follow-up in 1 year if not noted.  Medications Discontinued During This Encounter  Medication Reason  . celecoxib (CELEBREX) 200 MG capsule Cost of medication   Signed,  Marijose Curington T. Pranay Hilbun, MD   Allergies as of 08/10/2016   No Known Allergies     Medication List       Accurate as of 08/10/16  4:24 PM. Always use your most recent med list.          aspirin 81 MG tablet Take 81 mg by mouth daily.   b complex vitamins tablet Take 1 tablet by mouth daily.   cyanocobalamin 2000 MCG tablet Take 2,000 mcg by mouth daily.   omeprazole 40 MG capsule Commonly known as:  PRILOSEC Take 1 capsule (40 mg total) by mouth daily.   Red Yeast Rice 600 MG Caps Take 1 capsule by mouth daily.   vitamin E 400 UNIT capsule Generic drug:  vitamin E Take 400 Units by mouth daily.   zoster vaccine live (PF) 19400 UNT/0.65ML injection Commonly known as:  ZOSTAVAX Inject 19,400 Units into the skin once.

## 2016-09-20 ENCOUNTER — Encounter: Payer: Self-pay | Admitting: Internal Medicine

## 2016-09-20 ENCOUNTER — Ambulatory Visit (INDEPENDENT_AMBULATORY_CARE_PROVIDER_SITE_OTHER): Payer: Medicare Other | Admitting: Internal Medicine

## 2016-09-20 ENCOUNTER — Telehealth: Payer: Self-pay

## 2016-09-20 VITALS — BP 132/76 | HR 94 | Temp 99.4°F | Resp 93 | Wt 180.1 lb

## 2016-09-20 DIAGNOSIS — R05 Cough: Secondary | ICD-10-CM

## 2016-09-20 DIAGNOSIS — R509 Fever, unspecified: Secondary | ICD-10-CM

## 2016-09-20 DIAGNOSIS — B370 Candidal stomatitis: Secondary | ICD-10-CM

## 2016-09-20 DIAGNOSIS — R059 Cough, unspecified: Secondary | ICD-10-CM

## 2016-09-20 MED ORDER — OSELTAMIVIR PHOSPHATE 75 MG PO CAPS: 75.0000 mg | ORAL_CAPSULE | Freq: Two times a day (BID) | ORAL | 0 refills | Status: DC

## 2016-09-20 MED ORDER — HYDROCODONE-HOMATROPINE 5-1.5 MG/5ML PO SYRP: 5.0000 mL | ORAL_SOLUTION | Freq: Four times a day (QID) | ORAL | 0 refills | Status: DC | PRN

## 2016-09-20 MED ORDER — NYSTATIN 100000 UNIT/ML MT SUSP: 5.0000 mL | Freq: Four times a day (QID) | OROMUCOSAL | 0 refills | Status: DC

## 2016-09-20 NOTE — Patient Instructions (Addendum)
Influenza, Adult Influenza ("the flu") is an infection in the lungs, nose, and throat (respiratory tract). It is caused by a virus. The flu causes many common cold symptoms, as well as a high fever and body aches. It can make you feel very sick. The flu spreads easily from person to person (is contagious). Getting a flu shot (influenza vaccination) every year is the best way to prevent the flu. Follow these instructions at home:  Take over-the-counter and prescription medicines only as told by your doctor.  Use a cool mist humidifier to add moisture (humidity) to the air in your home. This can make it easier to breathe.  Rest as needed.  Drink enough fluid to keep your pee (urine) clear or pale yellow.  Cover your mouth and nose when you cough or sneeze.  Wash your hands with soap and water often, especially after you cough or sneeze. If you cannot use soap and water, use hand sanitizer.  Stay home from work or school as told by your doctor. Unless you are visiting your doctor, try to avoid leaving home until your fever has been gone for 24 hours without the use of medicine.  Keep all follow-up visits as told by your doctor. This is important. How is this prevented?  Getting a yearly (annual) flu shot is the best way to avoid getting the flu. You may get the flu shot in late summer, fall, or winter. Ask your doctor when you should get your flu shot.  Wash your hands often or use hand sanitizer often.  Avoid contact with people who are sick during cold and flu season.  Eat healthy foods.  Drink plenty of fluids.  Get enough sleep.  Exercise regularly. Contact a doctor if:  You get new symptoms.  You have:  Chest pain.  Watery poop (diarrhea).  A fever.  Your cough gets worse.  You start to have more mucus.  You feel sick to your stomach (nauseous).  You throw up (vomit). Get help right away if:  You start to be short of breath or have trouble breathing.  Your  skin or nails turn a bluish color.  You have very bad pain or stiffness in your neck.  You get a sudden headache.  You get sudden pain in your face or ear.  You cannot stop throwing up. This information is not intended to replace advice given to you by your health care provider. Make sure you discuss any questions you have with your health care provider. Document Released: 05/16/2008 Document Revised: 01/13/2016 Document Reviewed: 06/01/2015 Elsevier Interactive Patient Education  2017 [employer]. .[place]

## 2016-09-20 NOTE — Telephone Encounter (Signed)
The patient was seen by [name] this morning.

## 2016-09-20 NOTE — Telephone Encounter (Signed)
Pt left vm; fever between 100 - 101, S/T, H/A and congestion; pt wonders if beginning the flu. Pt request med to [facility]. No available appts at [facility].Please advise.

## 2016-09-20 NOTE — Telephone Encounter (Signed)
Thank you; pt left v/m at 8:38 am and pt called back at 9:30 am and Jeani Hawking at front desk said Scott Echevaria NP had opened her morning schedule to acutes and pt was scheduled today at 9:45 with Scott Echevaria NP.

## 2016-09-20 NOTE — Progress Notes (Signed)
HPI  Pt presents to the clinic today with c/o headache, runny nose, sore throat and cough. This started yesterday. He is blowing clear mucous out of his nose. He denies difficulty swallowing. The cough is non productive. He is mildly short of breath but denies chest pain. He has run fevers up to 101.0, had chills and body aches. He denies ear pain. He has taken Tylenol with minimal relief. He has a history of seasonal allergies but denies breathing problems. He has not had sick contacts that he is aware of. He has had his flu shot.   Review of Systems        Past Medical History:  Diagnosis Date  . Allergic rhinitis due to pollen   . Arthritis   . GERD (gastroesophageal reflux disease)   . Hyperlipidemia   . Prostate cancer ([place])    cancer  . Right bundle branch block    RBBB    Family History  Problem Relation Age of Onset  . Cancer Father     prostate  . Cancer Brother     prostate  . Cancer Brother     prostate    Social History   Social History  . Marital status: Married    Spouse name: N/A  . Number of children: N/A  . Years of education: N/A   Occupational History  . auctioneer Retired   Social History Main Topics  . Smoking status: Never Smoker  . Smokeless tobacco: Never Used  . Alcohol use No  . Drug use: No  . Sexual activity: Not on file   Other Topics Concern  . Not on file   Social History Narrative   Former competitive bodybuilder, Geophysicist/field seismologist, all natural   [employer], [place], [employer], multiple years    No Known Allergies   Constitutional: Positive headache, fatigue and fever. Denies abrupt weight changes.  HEENT:  Positive runny nose,  sore throat. Denies eye redness, eye pain, pressure behind the eyes, facial pain, nasal congestion, ear pain, ringing in the ears, wax buildup, or bloody nose. Respiratory: Positive cough and shortness of breath. Denies difficulty breathing.  Cardiovascular: Denies chest pain, chest  tightness, palpitations or swelling in the hands or feet.   No other specific complaints in a complete review of systems (except as listed in HPI above).  Objective:  BP 132/76   Pulse 94   Temp 99.4 F (37.4 C)   Resp (!) 93   Wt 180 lb 1.9 oz (81.7 kg)   BMI 27.39 kg/m   Wt Readings from Last 3 Encounters:  09/20/16 180 lb 1.9 oz (81.7 kg)  08/10/16 176 lb (79.8 kg)  12/16/15 179 lb (81.2 kg)     General: Appears his stated age, ill appearing, in NAD. HEENT: Head: normal shape and size, no sinus tenderness  noted; Eyes: sclera white, no icterus, conjunctiva pink; Ears: Tm's gray and intact, normal light reflex; Nose: mucosa pink and moist, septum midline; Throat/Mouth: Teeth present, mucosa erythematous and moist, no exudate noted, no lesions or ulcerations noted. Yeast noted on tongue. Neck: No cervical lymphadenopathy.  Cardiovascular: Normal rate and rhythm. S1,S2 noted.  No murmur, rubs or gallops noted.  Pulmonary/Chest: Normal effort and diminshed vesicular breath sounds. No respiratory distress. No wheezes, rales or ronchi noted.       Assessment & Plan:   Cough and fever:  Concerning for flu Get some rest and drink plenty of water Do salt water gargles for the sore throat eRx  for Tamiflu x 5 days Rx for Hycodan cough syrup  Oral Thrush:  eRx for Nystatin swish and swallow  RTC as needed or if symptoms persist.   Webb Silversmith, NP

## 2016-09-21 ENCOUNTER — Telehealth: Payer: Self-pay | Admitting: Family Medicine

## 2016-09-21 NOTE — Telephone Encounter (Signed)
See phone note earlier from [place].  Looks like this has been addressed.

## 2016-09-21 NOTE — Telephone Encounter (Signed)
noted 

## 2016-09-21 NOTE — Telephone Encounter (Signed)
Patient Name: Scott [name]  DOB: 02/15/1941    Initial Comment 2nd call received - [name] states that his fever is now 105.3. Caller states was in yesterday and given cough med and mouth wash. Not getting any better. Temp is running from 100-103   Nurse Assessment  Nurse: Raphael Gibney, RN, Vanita Ingles Date/Time Eilene Ghazi Time): 09/21/2016 11:30:38 AM  Confirm and document reason for call. If symptomatic, describe symptoms. ---Caller states he was diagnosed with the flu yesterday. Has been taking tamiflu. Temp is now 105.4. No chest pain or SOB.  Does the patient have any new or worsening symptoms? ---Yes  Will a triage be completed? ---Yes  Related visit to physician within the last 2 weeks? ---Yes  Does the PT have any chronic conditions? (i.e. diabetes, asthma, etc.) ---No  Is this a behavioral health or substance abuse call? ---No     Guidelines    Guideline Title Affirmed Question Affirmed Notes  Influenza Follow-up Call Fever > 104 F (40 C)    Final Disposition User   Go to ED Now (or PCP triage) Raphael Gibney, RN, Vera    Comments  No appts available at [facility]. Pt does not want to go to ER or urgent care.  Called back line and spoke to Rina and gave report that pt was diagnosed with the flu yesterday. Taking Tamiflu. Temp 105.4. No chet pain or SOB. He is coughing. Triage outcome of go to ER now (or PCP triage). No appt available at [facility]. Pt does not want to go to ER or urgent care.   Referrals  GO TO FACILITY REFUSED   Disagree/Comply: Disagree  Disagree/Comply Reason: Disagree with instructions

## 2016-09-21 NOTE — Telephone Encounter (Signed)
Pt called because he is feeling ill and has a fever.  He wants something called into [facility].  He did not leave any info about his symptoms other than he has a fever.

## 2016-09-21 NOTE — Telephone Encounter (Signed)
I spoke with pt and asked pt to retake temp; fever now is 102.4; pt has just taken ibuprofen 400 mg. Pt did have one episode after coughing that pt vomited clear water.pt having chills and h/a. No SOB or wheezing. Avie Echevaria NP advised to alternate tylenol 1000 mg po q4h with ibuprofen 800 mg. This should help reduce fever and help the h/a. Pt also tried to eat regular food and could not keep it down. Advised pt to try broth or chicken soup and to drink plenty of fluids.If pt develops SOB to go to ED. Pt voiced understanding and will cb if needed. FYI to Avie Echevaria NP.

## 2016-09-21 NOTE — Telephone Encounter (Signed)
Please discuss influenza with the patient. He was given a script for tamiflu yesterday.

## 2016-10-16 ENCOUNTER — Encounter: Payer: Self-pay | Admitting: Family Medicine

## 2016-10-16 ENCOUNTER — Ambulatory Visit (INDEPENDENT_AMBULATORY_CARE_PROVIDER_SITE_OTHER): Payer: Medicare Other | Admitting: Family Medicine

## 2016-10-16 VITALS — BP 130/84 | HR 73 | Temp 97.7°F | Ht 68.0 in | Wt 175.2 lb

## 2016-10-16 DIAGNOSIS — R1010 Upper abdominal pain, unspecified: Secondary | ICD-10-CM | POA: Diagnosis not present

## 2016-10-16 DIAGNOSIS — C61 Malignant neoplasm of prostate: Secondary | ICD-10-CM

## 2016-10-16 LAB — BASIC METABOLIC PANEL
BUN: 13 mg/dL (ref 6–23)
CHLORIDE: 104 meq/L (ref 96–112)
CO2: 29 mEq/L (ref 19–32)
CREATININE: 0.83 mg/dL (ref 0.40–1.50)
Calcium: 10 mg/dL (ref 8.4–10.5)
GFR: 96.37 mL/min (ref >60.00)
GLUCOSE: 104 mg/dL — AB (ref 70–99)
Potassium: 4.9 mEq/L (ref 3.5–5.1)
Sodium: 140 mEq/L (ref 135–145)

## 2016-10-16 LAB — HEPATIC FUNCTION PANEL
ALBUMIN: 4.2 g/dL (ref 3.5–5.2)
ALK PHOS: 70 U/L (ref 39–117)
ALT: 24 U/L (ref 0–53)
AST: 21 U/L (ref 0–37)
Bilirubin, Direct: 0.1 mg/dL (ref 0.0–0.3)
Total Bilirubin: 0.4 mg/dL (ref 0.2–1.2)
Total Protein: 7.2 g/dL (ref 6.0–8.3)

## 2016-10-16 LAB — LIPASE: LIPASE: 36 U/L (ref 11.0–59.0)

## 2016-10-16 LAB — H. PYLORI ANTIBODY, IGG: H Pylori IgG: NEGATIVE

## 2016-10-16 NOTE — Progress Notes (Signed)
Dr. Frederico Hamman T. Alfredo Spong, MD, [facility] [place], [address] Phone: [phone] Fax: [phone]  10/16/2016  Patient: Scott [name], MRN: [id number], DOB: 1941/05/29, 76 y.o.  Primary Physician:  Owens Loffler, MD   Chief Complaint  Patient presents with  . Headache  . Burning in stomach   Subjective:   Scott [name] is a 76 y.o. very pleasant male patient who presents with the following:  Pleasant gentleman who is completely healthy who still at age 53 can do 100 pushups straight.  He presents with a primary complaint of abdominal pain over the last week or so.  He is eating and drinking okay, and no specific foods bother him at all.  Previously did have some reflux symptoms, but taking a PPI has completely resolved all of these.  He has no issues with spicy foods, fatty foods, or any specific food at all.  Burning and pressure and pains up in his chest. No chest pain or chest pressure, and he routinely exercises virtually every day for well [place] of an hour a day.  He is never had any kind of issues at all when exercising or exerting himself at a very high level.  No melena, no BPRBPR.  History is also significant for prior prostate cancer s/p prostatectomy.   Past Medical History, Surgical History, Social History, Family History, Problem List, Medications, and Allergies have been reviewed and updated if relevant.  Patient Active Problem List   Diagnosis Date Noted  . Prostate cancer, in remission   . Right bundle branch block   . AV BLOCK, 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  . HYPERLIPIDEMIA 04/28/2010  . ALLERGIC RHINITIS 04/28/2010  . GERD 04/28/2010  . Erectile dysfunction following radical prostatectomy 04/28/2010    Past Medical History:  Diagnosis Date  . Allergic rhinitis due to pollen   . Arthritis   . GERD (gastroesophageal reflux disease)   . Hyperlipidemia   . Prostate cancer ([address])    cancer  .  Right bundle branch block    RBBB    Past Surgical History:  Procedure Laterality Date  . APPENDECTOMY    . HERNIA REPAIR  85,03   rt and lt ing hernia  . KNEE ARTHROSCOPY  2012   left  . prostatectomy  1998  . ROTATOR CUFF REPAIR     right  . SHOULDER ARTHROSCOPY Right 01/29/2013   Procedure: RIGHT ARTHROSCOPY SHOULDER WITH DEBRIDEMENT, POSSIBLE ROTATOR CUFF REPAIR;  Surgeon: Kerin Salen, MD;  Location: [facility];  Service: Orthopedics;  Laterality: Right;  . TONSILLECTOMY    . TOTAL HIP ARTHROPLASTY  2007   right     Social History   Social History  . Marital status: Married    Spouse name: N/A  . Number of children: N/A  . Years of education: N/A   Occupational History  . auctioneer Retired   Social History Main Topics  . Smoking status: Never Smoker  . Smokeless tobacco: Never Used  . Alcohol use No  . Drug use: No  . Sexual activity: Not on file   Other Topics Concern  . Not on file   Social History Narrative   Former competitive bodybuilder, Geophysicist/field seismologist, all natural   [employer], [employer], [employer], multiple years    Family History  Problem Relation Age of Onset  . Cancer Father     prostate  . Cancer Brother     prostate  .  Cancer Brother     prostate    No Known Allergies  Medication list reviewed and updated in full in [place].   GEN: Recent influenza GI: No n/v/d, eating normally Pulm: No SOB Interactive and getting along well at home.  Otherwise, ROS is as per the HPI.  Objective:   BP 130/84   Pulse 73   Temp 97.7 F (36.5 C) (Oral)   Ht 5\' 8"  (1.727 m)   Wt 175 lb 4 oz (79.5 kg)   BMI 26.65 kg/m   GEN: WDWN, NAD, Non-toxic, A & O x 3 HEENT: Atraumatic, Normocephalic. Neck supple. No masses, No LAD. Ears and Nose: No external deformity. CV: RRR, No M/G/R. No JVD. No thrill. No extra heart sounds. PULM: CTA B, no wheezes, crackles, rhonchi. No retractions. No resp. distress. No  accessory muscle use. ABD: S, mild mod tenderness RUQ, epigastric, and LUQ, ND, + BS, No rebound, No HSM  EXTR: No c/c/e NEURO Normal gait.  PSYCH: Normally interactive. Conversant. Not depressed or anxious appearing.  Calm demeanor.   Laboratory and Imaging Data: Results for orders placed or performed in visit on 10/16/16  Hepatic function panel  Result Value Ref Range   Total Bilirubin 0.4 0.2 - 1.2 mg/dL   Bilirubin, Direct 0.1 0.0 - 0.3 mg/dL   Alkaline Phosphatase 70 39 - 117 U/L   AST 21 0 - 37 U/L   ALT 24 0 - 53 U/L   Total Protein 7.2 6.0 - 8.3 g/dL   Albumin 4.2 3.5 - 5.2 g/dL  Lipase  Result Value Ref Range   Lipase 36.0 11.0 - 59.0 U/L  H. pylori antibody, IgG  Result Value Ref Range   H Pylori IgG Negative Negative  Basic metabolic panel  Result Value Ref Range   Sodium 140 135 - 145 mEq/L   Potassium 4.9 3.5 - 5.1 mEq/L   Chloride 104 96 - 112 mEq/L   CO2 29 19 - 32 mEq/L   Glucose, Bld 104 (H) 70 - 99 mg/dL   BUN 13 6 - 23 mg/dL   Creatinine, Ser 0.83 0.40 - 1.50 mg/dL   Calcium 10.0 8.4 - 10.5 mg/dL   GFR 96.37 >60.00 mL/min     Assessment and Plan:   Pain of upper abdomen - Plan: Hepatic function panel, Lipase, H. pylori antibody, IgG, Basic metabolic panel, CT ABDOMEN PELVIS W CONTRAST  Prostate cancer, in remission  Perfectly healthy gentleman with abdominal pain of unclear origin.  Obtain a CT of the abdomen and pelvis with contrast to evaluate for potential diverticulitis, colitis, neoplastic disease would be lower on the differential but possible.  Follow-up: depending on scan  Medications Discontinued During This Encounter  Medication Reason  . HYDROcodone-homatropine (HYCODAN) 5-1.5 MG/5ML syrup Completed Course  . oseltamivir (TAMIFLU) 75 MG capsule Completed Course   Orders Placed This Encounter  Procedures  . CT ABDOMEN PELVIS W CONTRAST  . Hepatic function panel  . Lipase  . H. pylori antibody, IgG  . Basic metabolic panel     Signed,  Frederico Hamman T. Aanika Defoor, MD   Allergies as of 10/16/2016   No Known Allergies     Medication List       Accurate as of 10/16/16 11:59 PM. Always use your most recent med list.          aspirin 81 MG tablet Take 81 mg by mouth daily.   b complex vitamins tablet Take 1 tablet by mouth daily.  cyanocobalamin 2000 MCG tablet Take 2,000 mcg by mouth daily.   nystatin 100000 UNIT/ML suspension Commonly known as:  MYCOSTATIN Take 5 mLs (500,000 Units total) by mouth 4 (four) times daily.   omeprazole 40 MG capsule Commonly known as:  PRILOSEC Take 1 capsule (40 mg total) by mouth daily.   Red Yeast Rice 600 MG Caps Take 1 capsule by mouth daily.   vitamin E 400 UNIT capsule Generic drug:  vitamin E Take 400 Units by mouth daily.   zoster vaccine live (PF) 19400 UNT/0.65ML injection Commonly known as:  ZOSTAVAX Inject 19,400 Units into the skin once.

## 2016-10-16 NOTE — Progress Notes (Signed)
Pre visit review using our clinic review tool, if applicable. No additional management support is needed unless otherwise documented below in the visit note. 

## 2016-10-16 NOTE — Patient Instructions (Signed)

## 2016-10-17 ENCOUNTER — Ambulatory Visit (INDEPENDENT_AMBULATORY_CARE_PROVIDER_SITE_OTHER)
Admission: RE | Admit: 2016-10-17 | Discharge: 2016-10-17 | Disposition: A | Discharge status: Home / Self Care | Payer: Medicare Other | Source: Ambulatory Visit | Attending: Family Medicine | Admitting: Family Medicine

## 2016-10-17 DIAGNOSIS — R1010 Upper abdominal pain, unspecified: Secondary | ICD-10-CM | POA: Diagnosis not present

## 2016-10-17 DIAGNOSIS — N281 Cyst of kidney, acquired: Secondary | ICD-10-CM | POA: Diagnosis not present

## 2016-10-17 MED ORDER — IOPAMIDOL (ISOVUE-300) INJECTION 61%
100.0000 mL | Freq: Once | INTRAVENOUS | Status: AC | PRN
  Administered 2016-10-17: 100 mL via INTRAVENOUS

## 2016-10-18 ENCOUNTER — Other Ambulatory Visit: Payer: Self-pay | Admitting: Family Medicine

## 2016-10-18 DIAGNOSIS — R935 Abnormal findings on diagnostic imaging of other abdominal regions, including retroperitoneum: Secondary | ICD-10-CM

## 2016-10-18 DIAGNOSIS — N2889 Other specified disorders of kidney and ureter: Secondary | ICD-10-CM

## 2016-10-20 ENCOUNTER — Ambulatory Visit (HOSPITAL_COMMUNITY)
Admission: RE | Admit: 2016-10-20 | Discharge: 2016-10-20 | Disposition: A | Discharge status: Home / Self Care | Payer: Medicare Other | Source: Ambulatory Visit | Attending: Family Medicine | Admitting: Family Medicine

## 2016-10-20 DIAGNOSIS — R935 Abnormal findings on diagnostic imaging of other abdominal regions, including retroperitoneum: Secondary | ICD-10-CM | POA: Diagnosis not present

## 2016-10-20 DIAGNOSIS — N2889 Other specified disorders of kidney and ureter: Secondary | ICD-10-CM | POA: Insufficient documentation

## 2016-10-20 DIAGNOSIS — N281 Cyst of kidney, acquired: Secondary | ICD-10-CM | POA: Insufficient documentation

## 2016-10-20 MED ORDER — GADOBENATE DIMEGLUMINE 529 MG/ML IV SOLN
20.0000 mL | Freq: Once | INTRAVENOUS | Status: AC
  Administered 2016-10-20: 20 mL via INTRAVENOUS

## 2016-10-23 ENCOUNTER — Telehealth: Payer: Self-pay | Admitting: Family Medicine

## 2016-10-23 NOTE — Telephone Encounter (Signed)
Please call patient with MRI results.  He wants to know a.s.a.p..  He's concerned he has cancer.

## 2016-12-28 ENCOUNTER — Other Ambulatory Visit: Payer: Self-pay | Admitting: Internal Medicine

## 2016-12-28 DIAGNOSIS — K219 Gastro-esophageal reflux disease without esophagitis: Secondary | ICD-10-CM

## 2017-05-07 DIAGNOSIS — Z23 Encounter for immunization: Secondary | ICD-10-CM | POA: Diagnosis not present

## 2017-06-14 DIAGNOSIS — M25512 Pain in left shoulder: Secondary | ICD-10-CM | POA: Diagnosis not present

## 2017-09-10 ENCOUNTER — Telehealth: Payer: Self-pay

## 2017-09-10 ENCOUNTER — Ambulatory Visit (INDEPENDENT_AMBULATORY_CARE_PROVIDER_SITE_OTHER): Payer: Medicare Other

## 2017-09-10 VITALS — BP 126/88 | HR 63 | Temp 97.7°F | Ht 67.5 in | Wt 178.2 lb

## 2017-09-10 DIAGNOSIS — E7849 Other hyperlipidemia: Secondary | ICD-10-CM | POA: Diagnosis not present

## 2017-09-10 DIAGNOSIS — Z79899 Other long term (current) drug therapy: Secondary | ICD-10-CM

## 2017-09-10 DIAGNOSIS — Z8546 Personal history of malignant neoplasm of prostate: Secondary | ICD-10-CM | POA: Diagnosis not present

## 2017-09-10 DIAGNOSIS — Z0000 Encounter for general adult medical examination without abnormal findings: Secondary | ICD-10-CM | POA: Diagnosis not present

## 2017-09-10 LAB — CBC WITH DIFFERENTIAL/PLATELET
BASOS ABS: 0 10*3/uL (ref 0.0–0.1)
Basophils Relative: 0.5 % (ref 0.0–3.0)
EOS ABS: 0.2 10*3/uL (ref 0.0–0.7)
Eosinophils Relative: 3.5 % (ref 0.0–5.0)
HCT: 45.1 % (ref 39.0–52.0)
Hemoglobin: 14.9 g/dL (ref 13.0–17.0)
LYMPHS ABS: 1.1 10*3/uL (ref 0.7–4.0)
Lymphocytes Relative: 22.6 % (ref 12.0–46.0)
MCHC: 33.1 g/dL (ref 30.0–36.0)
MCV: 89 fl (ref 78.0–100.0)
MONOS PCT: 9.8 % (ref 3.0–12.0)
Monocytes Absolute: 0.5 10*3/uL (ref 0.1–1.0)
NEUTROS ABS: 3.1 10*3/uL (ref 1.4–7.7)
NEUTROS PCT: 63.6 % (ref 43.0–77.0)
PLATELETS: 206 10*3/uL (ref 150.0–400.0)
RBC: 5.06 Mil/uL (ref 4.22–5.81)
RDW: 13.8 % (ref 11.5–15.5)
WBC: 4.8 10*3/uL (ref 4.0–10.5)

## 2017-09-10 LAB — LIPID PANEL
CHOLESTEROL: 184 mg/dL (ref 0–200)
HDL: 32.1 mg/dL — AB (ref >39.00)
LDL Cholesterol: 125 mg/dL — ABNORMAL HIGH (ref 0–99)
NonHDL: 152
TRIGLYCERIDES: 134 mg/dL (ref 0.0–149.0)
Total CHOL/HDL Ratio: 6
VLDL: 26.8 mg/dL (ref 0.0–40.0)

## 2017-09-10 LAB — HEPATIC FUNCTION PANEL
ALBUMIN: 4.2 g/dL (ref 3.5–5.2)
ALK PHOS: 65 U/L (ref 39–117)
ALT: 22 U/L (ref 0–53)
AST: 20 U/L (ref 0–37)
BILIRUBIN DIRECT: 0.1 mg/dL (ref 0.0–0.3)
TOTAL PROTEIN: 6.7 g/dL (ref 6.0–8.3)
Total Bilirubin: 0.3 mg/dL (ref 0.2–1.2)

## 2017-09-10 LAB — BASIC METABOLIC PANEL
BUN: 20 mg/dL (ref 6–23)
CALCIUM: 9.1 mg/dL (ref 8.4–10.5)
CHLORIDE: 105 meq/L (ref 96–112)
CO2: 28 meq/L (ref 19–32)
CREATININE: 0.92 mg/dL (ref 0.40–1.50)
GFR: 85.36 mL/min (ref >60.00)
GLUCOSE: 114 mg/dL — AB (ref 70–99)
Potassium: 4.6 mEq/L (ref 3.5–5.1)
SODIUM: 140 meq/L (ref 135–145)

## 2017-09-10 NOTE — Progress Notes (Signed)
I reviewed health advisor's note, was available for consultation, and agree with documentation and plan.   Signed,  Beyza Bellino T. Clova Morlock, MD  

## 2017-09-10 NOTE — Progress Notes (Signed)
Pre visit review using our clinic review tool, if applicable. No additional management support is needed unless otherwise documented below in the visit note. 

## 2017-09-10 NOTE — Progress Notes (Signed)
PCP notes:   Health maintenance:  Tetanus vaccine - postponed/insurance  Abnormal screenings:   Depression screen: 5 Depression screen [facility] 2/9 09/10/2017 08/10/2016 05/05/2015 11/26/2013  Decreased Interest 2 0 0 0  Down, Depressed, Hopeless 0 2 0 0  PHQ - 2 Score 2 2 0 0  Altered sleeping 1 3 - -  Tired, decreased energy 1 3 - -  Change in appetite 0 0 - -  Feeling bad or failure about yourself  1 2 - -  Trouble concentrating 0 1 - -  Moving slowly or fidgety/restless 0 0 - -  Suicidal thoughts 0 0 - -  PHQ-9 Score 5 11 - -  Difficult doing work/chores Not difficult at all Not difficult at all - -   Hearing - failed  Hearing Screening   125Hz  250Hz  500Hz  1000Hz  2000Hz  3000Hz  4000Hz  6000Hz  8000Hz   Right ear:   40 40 40  0    Left ear:   40 40 40  0     Patient concerns:   Pt reports concerns with sleep management. States he can only sleep 1-1.5 hours at a time.   Nurse concerns:  None  Next PCP appt:   09/19/17 @ 0820

## 2017-09-10 NOTE — Progress Notes (Signed)
Subjective:   Scott [name] is a 77 y.o. male who presents for Medicare Annual/Subsequent preventive examination.  Review of Systems:  N/A Cardiac Risk Factors include: advanced age (>78men, >20 women);male gender;dyslipidemia     Objective:    Vitals: BP 126/88 (BP Location: Right Arm, Patient Position: Sitting, Cuff Size: Normal)   Pulse 63   Temp 97.7 F (36.5 C) (Oral)   Ht 5' 7.5" (1.715 m) Comment: no shoes  Wt 178 lb 4 oz (80.9 kg)   SpO2 96%   BMI 27.51 kg/m   Body mass index is 27.51 kg/m.  Advanced Directives 09/10/2017 01/24/2013  Does Patient Have a Medical Advance Directive? No Patient has advance directive, copy not in chart  Would patient like information on creating a medical advance directive? Yes (MAU/Ambulatory/Procedural Areas - Information given) -    Tobacco Social History   Tobacco Use  Smoking Status Never Smoker  Smokeless Tobacco Never Used     Counseling given: No   Clinical Intake:  Pre-visit preparation completed: Yes  Pain : No/denies pain Pain Score: 0-No pain     Nutritional Status: BMI 25 -29 Overweight Nutritional Risks: None Diabetes: No  How often do you need to have someone help you when you read instructions, pamphlets, or other written materials from your doctor or pharmacy?: 1 - Never What is the last grade level you completed in school?: 7th grade  Interpreter Needed?: No  Comments: pt lives with spouse Information entered by :: LPinson, LPN  Past Medical History:  Diagnosis Date  . Allergic rhinitis due to pollen   . Arthritis   . GERD (gastroesophageal reflux disease)   . Hyperlipidemia   . Prostate cancer ([place])    cancer  . Right bundle branch block    RBBB   Past Surgical History:  Procedure Laterality Date  . APPENDECTOMY    . HERNIA REPAIR  85,03   rt and lt ing hernia  . KNEE ARTHROSCOPY  2012   left  . prostatectomy  1998  . ROTATOR CUFF REPAIR     right  . SHOULDER ARTHROSCOPY Right  01/29/2013   Procedure: RIGHT ARTHROSCOPY SHOULDER WITH DEBRIDEMENT, POSSIBLE ROTATOR CUFF REPAIR;  Surgeon: Kerin Salen, MD;  Location: [facility];  Service: Orthopedics;  Laterality: Right;  . TONSILLECTOMY    . TOTAL HIP ARTHROPLASTY  2007   right    Family History  Problem Relation Age of Onset  . Cancer Father        prostate  . Cancer Brother        prostate  . Cancer Brother        prostate   Social History   Socioeconomic History  . Marital status: Married    Spouse name: None  . Number of children: None  . Years of education: None  . Highest education level: None  Social Needs  . Financial resource strain: None  . Food insecurity - worry: None  . Food insecurity - inability: None  . Transportation needs - medical: None  . Transportation needs - non-medical: None  Occupational History  . Occupation: Herbalist: RETIRED  Tobacco Use  . Smoking status: Never Smoker  . Smokeless tobacco: Never Used  Substance and Sexual Activity  . Alcohol use: No  . Drug use: No  . Sexual activity: None  Other Topics Concern  . None  Social History Narrative   Former competitive bodybuilder, Geophysicist/field seismologist, all natural  [employer], [place], [employer], multiple years    Outpatient Encounter Medications as of 09/10/2017  Medication Sig  . aspirin 81 MG tablet Take 81 mg by mouth daily.    Marland Kitchen b complex vitamins tablet Take 1 tablet by mouth daily.  . cyanocobalamin 2000 MCG tablet Take 2,000 mcg by mouth daily.  Marland Kitchen nystatin (MYCOSTATIN) 100000 UNIT/ML suspension Take 5 mLs (500,000 Units total) by mouth 4 (four) times daily.  Marland Kitchen omeprazole (PRILOSEC) 40 MG capsule TAKE ONE CAPSULE BY MOUTH DAILY  . Red Yeast Rice 600 MG CAPS Take 1 capsule by mouth daily.    . vitamin E (VITAMIN E) 400 UNIT capsule Take 400 Units by mouth daily.  Marland Kitchen zoster vaccine live, PF, (ZOSTAVAX) 00923 UNT/0.65ML injection Inject 19,400 Units into the skin once.   No  facility-administered encounter medications on file as of 09/10/2017.     Activities of Daily Living In your present state of health, do you have any difficulty performing the following activities: 09/10/2017  Hearing? N  Vision? N  Difficulty concentrating or making decisions? N  Walking or climbing stairs? N  Dressing or bathing? N  Doing errands, shopping? N  Preparing Food and eating ? N  Using the Toilet? N  In the past six months, have you accidently leaked urine? N  Do you have problems with loss of bowel control? N  Managing your Medications? N  Managing your Finances? N  Housekeeping or managing your Housekeeping? N  Some recent data might be hidden    Patient Care Team: Owens Loffler, MD as PCP - General   Assessment:   This is a routine wellness examination for Scott [name].   Hearing Screening   125Hz  250Hz  500Hz  1000Hz  2000Hz  3000Hz  4000Hz  6000Hz  8000Hz   Right ear:   40 40 40  0    Left ear:   40 40 40  0      Visual Acuity Screening   Right eye Left eye Both eyes  Without correction: 20/40 20/25 20/25   With correction:        Exercise Activities and Dietary recommendations Current Exercise Habits: Home exercise routine, Type of exercise: calisthenics;strength training/weights, Time (Minutes): 45, Frequency (Times/Week): 4, Weekly Exercise (Minutes/Week): 180, Intensity: Moderate, Exercise limited by: None identified  Goals    . Increase physical activity     Starting 09/10/2017, I will continue to exercise for at least 30-45 minutes 3-4 days per week.        Fall Risk Fall Risk  09/10/2017 08/10/2016 07/28/2016 05/05/2015 11/26/2013  Falls in the past year? No No No No No  Comment - - Emmi Telephone Survey: data to providers prior to load - -   Depression Screen PHQ 2/9 Scores 09/10/2017 08/10/2016 05/05/2015 11/26/2013  PHQ - 2 Score 2 2 0 0  PHQ- 9 Score 5 11 - -    Cognitive Function MMSE - Mini Mental State Exam 09/10/2017  Orientation to time 5    Orientation to Place 5  Registration 3  Attention/ Calculation 0  Recall 3  Language- name 2 objects 0  Language- repeat 1  Language- follow 3 step command 3  Language- read & follow direction 0  Write a sentence 0  Copy design 0  Total score 20     PLEASE NOTE: A Mini-Cog screen was completed. Maximum score is 20. A value of 0 denotes this part of [place] MMSE was not completed or the patient failed this part of the Mini-Cog screening.  Mini-Cog Screening Orientation to Time - Max 5 pts Orientation to Place - Max 5 pts Registration - Max 3 pts Recall - Max 3 pts Language Repeat - Max 1 pts Language Follow 3 Step Command - Max 3 pts     Immunization History  Administered Date(s) Administered  . Influenza Split 06/13/2011  . Influenza,inj,Quad PF,6+ Mos 05/05/2015  . Influenza-Unspecified 06/01/2014, 06/21/2017  . Pneumococcal Conjugate-13 11/26/2013  . Pneumococcal Polysaccharide-23 05/05/2015    Screening Tests Health Maintenance  Topic Date Due  . TETANUS/TDAP  09/10/2018 (Originally 03/11/1960)  . COLONOSCOPY  01/20/2019  . INFLUENZA VACCINE  Completed  . PNA vac Low Risk Adult  Completed      Plan:     I have personally reviewed, addressed, and noted the following in the patient's chart:  A. Medical and social history B. Use of alcohol, tobacco or illicit drugs  C. Current medications and supplements D. Functional ability and status E.  Nutritional status F.  Physical activity G. Advance directives H. List of other physicians I.  Hospitalizations, surgeries, and ER visits in previous 12 months J.  [place] to include hearing, vision, cognitive, depression L. Referrals and appointments - none  In addition, I have reviewed and discussed with patient certain preventive protocols, quality metrics, and best practice recommendations. A written personalized care plan for preventive services as well as general preventive health recommendations were  provided to patient.  See attached scanned questionnaire for additional information.   Signed,   Lindell Noe, MHA, BS, LPN Health Coach

## 2017-09-10 NOTE — Telephone Encounter (Signed)
Per written message from PCP:  Owens Loffler, MD  Eustace Pen, LPN        FLP, X43.5  Cbc with diff, HFP, BMET: Z79.899  PSA, free and total: history of prostate cancer   Previous Messages    ----- Message -----  From: Eustace Pen, LPN  Sent: 6/86/1683 12:33 PM  To: Owens Loffler, MD  Subject: Labs 09/10/17                   Lab orders needed. Thank you.   Insurance:  Medicare - traditional

## 2017-09-10 NOTE — Patient Instructions (Addendum)
Scott [name] , Thank you for taking time to come for your Medicare Wellness Visit. I appreciate your ongoing commitment to your health goals. Please review the following plan we discussed and let me know if I can assist you in the future.   These are the goals we discussed: Goals    . Increase physical activity     Starting 09/10/2017, I will continue to exercise for at least 30-45 minutes 3-4 days per week.        This is a list of the screening recommended for you and due dates:  Health Maintenance  Topic Date Due  . Tetanus Vaccine  09/10/2018*  . Colon Cancer Screening  01/20/2019  . Flu Shot  Completed  . Pneumonia vaccines  Completed  *Topic was postponed. The date shown is not the original due date.   Preventive Care for Adults  A healthy lifestyle and preventive care can promote health and wellness. Preventive health guidelines for adults include the following key practices.  . A routine yearly physical is a good way to check with your health care provider about your health and preventive screening. It is a chance to share any concerns and updates on your health and to receive a thorough exam.  . Visit your dentist for a routine exam and preventive care every 6 months. Brush your teeth twice a day and floss once a day. Good oral hygiene prevents tooth decay and gum disease.  . The frequency of eye exams is based on your age, health, family medical history, use  of contact lenses, and other factors. Follow your health care provider's recommendations for frequency of eye exams.  . Eat a healthy diet. Foods like vegetables, fruits, whole grains, low-fat dairy products, and lean protein foods contain the nutrients you need without too many calories. Decrease your intake of foods high in solid fats, added sugars, and salt. Eat the right amount of calories for you. Get information about a proper diet from your health care provider, if necessary.  . Regular physical exercise is one  of the most important things you can do for your health. Most adults should get at least 150 minutes of moderate-intensity exercise (any activity that increases your heart rate and causes you to sweat) each week. In addition, most adults need muscle-strengthening exercises on 2 or more days a week.  Silver Sneakers may be a benefit available to you. To determine eligibility, you may visit the website: www.silversneakers.com or contact program at 316-833-2909 Mon-Fri between 8AM-8PM.   . Maintain a healthy weight. The body mass index (BMI) is a screening tool to identify possible weight problems. It provides an estimate of body fat based on height and weight. Your health care provider can find your BMI and can help you achieve or maintain a healthy weight.   For adults 20 years and older: ? A BMI below 18.5 is considered underweight. ? A BMI of 18.5 to 24.9 is normal. ? A BMI of 25 to 29.9 is considered overweight. ? A BMI of 30 and above is considered obese.   . Maintain normal blood lipids and cholesterol levels by exercising and minimizing your intake of saturated fat. Eat a balanced diet with plenty of fruit and vegetables. Blood tests for lipids and cholesterol should begin at age 76 and be repeated every 5 years. If your lipid or cholesterol levels are high, you are over 50, or you are at high risk for heart disease, you may need your cholesterol levels  checked more frequently. Ongoing high lipid and cholesterol levels should be treated with medicines if diet and exercise are not working.  . If you smoke, find out from your health care provider how to quit. If you do not use tobacco, please do not start.  . If you choose to drink alcohol, please do not consume more than 2 drinks per day. One drink is considered to be 12 ounces (355 mL) of beer, 5 ounces (148 mL) of wine, or 1.5 ounces (44 mL) of liquor.  . If you are 53-51 years old, ask your health care provider if you should take aspirin  to prevent strokes.  . Use sunscreen. Apply sunscreen liberally and repeatedly throughout the day. You should seek shade when your shadow is shorter than you. Protect yourself by wearing long sleeves, pants, a wide-brimmed hat, and sunglasses year round, whenever you are outdoors.  . Once a month, do a whole body skin exam, using a mirror to look at the skin on your back. Tell your health care provider of new moles, moles that have irregular borders, moles that are larger than a pencil eraser, or moles that have changed in shape or color.

## 2017-09-11 ENCOUNTER — Ambulatory Visit: Payer: Medicare Other

## 2017-09-11 LAB — PSA, TOTAL AND FREE: PSA, % FREE: UNDETERMINED % (ref >25)

## 2017-09-14 ENCOUNTER — Telehealth: Payer: Self-pay | Admitting: Family Medicine

## 2017-09-14 NOTE — Telephone Encounter (Signed)
Copied from [place]. Topic: Quick Communication - See Telephone Encounter >> Sep 14, 2017 11:08 AM Conception Chancy, NT wrote: CRM for notification. See Telephone encounter for:  09/14/17. [facility] pof is calling and states in order for them to bill part B they are needing the diagnostic code for Omeprazole. Please advise and contact pharmacy

## 2017-09-14 NOTE — Telephone Encounter (Signed)
I spoke with Rod Holler at [facility] and gave Dx as GERD and code F21.9. Rod Holler voiced understanding and nothing further needed at this time.

## 2017-09-19 ENCOUNTER — Encounter: Payer: Self-pay | Admitting: Family Medicine

## 2017-09-19 ENCOUNTER — Ambulatory Visit (INDEPENDENT_AMBULATORY_CARE_PROVIDER_SITE_OTHER): Payer: Medicare Other | Admitting: Family Medicine

## 2017-09-19 ENCOUNTER — Ambulatory Visit: Payer: Medicare Other | Admitting: Family Medicine

## 2017-09-19 VITALS — BP 126/76 | HR 54 | Temp 97.6°F | Ht 67.5 in | Wt 177.0 lb

## 2017-09-19 DIAGNOSIS — K219 Gastro-esophageal reflux disease without esophagitis: Secondary | ICD-10-CM

## 2017-09-19 DIAGNOSIS — E785 Hyperlipidemia, unspecified: Secondary | ICD-10-CM

## 2017-09-19 DIAGNOSIS — C61 Malignant neoplasm of prostate: Secondary | ICD-10-CM

## 2017-09-19 MED ORDER — OMEPRAZOLE 40 MG PO CPDR: 40.0000 mg | DELAYED_RELEASE_CAPSULE | Freq: Every day | ORAL | 3 refills | Status: DC

## 2017-09-19 NOTE — Progress Notes (Signed)
Dr. Frederico Hamman T. Kynisha Memon, MD, [facility] [place], [address] Phone: [phone] Fax: [phone]  09/19/2017  Patient: Scott [name], MRN: [id number], DOB: 03-Mar-1941, 77 y.o.  Primary Physician:  Owens Loffler, MD   Chief Complaint  Patient presents with  . Annual Exam    Part 2   Subjective:   Scott [name] is a 77 y.o. very pleasant male patient who presents with the following:  F/u medical problems:  Had the flu   Lipids: Doing well, stable. Tolerating meds fine with no SE. Panel reviewed with patient.  Lipids:    Component Value Date/Time   CHOL 184 09/10/2017 1510   TRIG 134.0 09/10/2017 1510   HDL 32.10 (L) 09/10/2017 1510   LDLDIRECT 162.7 08/02/2010 0824   VLDL 26.8 09/10/2017 1510   CHOLHDL 6 09/10/2017 1510    Lab Results  Component Value Date   ALT 22 09/10/2017   AST 20 09/10/2017   ALKPHOS 65 09/10/2017   BILITOT 0.3 09/10/2017    Still working out all the time.  S/p prostatectomy and psa < 0.01  Lab Results  Component Value Date   PSA 0.01 (L) 04/28/2015   PSA 0.01 (L) 11/20/2013   PSA 0.01 (L) 11/13/2011    O/w doing well.   Past Medical History, Surgical History, Social History, Family History, Problem List, Medications, and Allergies have been reviewed and updated if relevant.  Patient Active Problem List   Diagnosis Date Noted  . Prostate cancer, in remission   . Right bundle branch block   . AV BLOCK, 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  . HYPERLIPIDEMIA 04/28/2010  . ALLERGIC RHINITIS 04/28/2010  . GERD 04/28/2010  . Erectile dysfunction following radical prostatectomy 04/28/2010    Past Medical History:  Diagnosis Date  . Allergic rhinitis due to pollen   . Arthritis   . GERD (gastroesophageal reflux disease)   . Hyperlipidemia   . Prostate cancer ([place])    cancer  . Right bundle branch block    RBBB    Past Surgical History:  Procedure Laterality Date  .  APPENDECTOMY    . HERNIA REPAIR  85,03   rt and lt ing hernia  . KNEE ARTHROSCOPY  2012   left  . prostatectomy  1998  . ROTATOR CUFF REPAIR     right  . SHOULDER ARTHROSCOPY Right 01/29/2013   Procedure: RIGHT ARTHROSCOPY SHOULDER WITH DEBRIDEMENT, POSSIBLE ROTATOR CUFF REPAIR;  Surgeon: Kerin Salen, MD;  Location: [facility];  Service: Orthopedics;  Laterality: Right;  . TONSILLECTOMY    . TOTAL HIP ARTHROPLASTY  2007   right     Social History   Socioeconomic History  . Marital status: Married    Spouse name: Not on file  . Number of children: Not on file  . Years of education: Not on file  . Highest education level: Not on file  Social Needs  . Financial resource strain: Not on file  . Food insecurity - worry: Not on file  . Food insecurity - inability: Not on file  . Transportation needs - medical: Not on file  . Transportation needs - non-medical: Not on file  Occupational History  . Occupation: Herbalist: RETIRED  Tobacco Use  . Smoking status: Never Smoker  . Smokeless tobacco: Never Used  Substance and Sexual Activity  . Alcohol use: No  . Drug use: No  . Sexual activity: Not  on file  Other Topics Concern  . Not on file  Social History Narrative   Former competitive bodybuilder, Geophysicist/field seismologist, all natural   [employer], [employer], [employer], multiple years    Family History  Problem Relation Age of Onset  . Cancer Father        prostate  . Cancer Brother        prostate  . Cancer Brother        prostate    No Known Allergies  Medication list reviewed and updated in full in [facility].   GEN: No acute illnesses, no fevers, chills. GI: No n/v/d, eating normally Pulm: No SOB Interactive and getting along well at home.  Otherwise, ROS is as per the HPI.  Objective:   BP 126/76   Pulse (!) 54   Temp 97.6 F (36.4 C) (Oral)   Ht 5' 7.5" (1.715 m)   Wt 177 lb (80.3 kg)   BMI 27.31 kg/m   GEN:  WDWN, NAD, Non-toxic, A & O x 3 HEENT: Atraumatic, Normocephalic. Neck supple. No masses, No LAD. Ears and Nose: No external deformity. CV: RRR, No M/G/R. No JVD. No thrill. No extra heart sounds. PULM: CTA B, no wheezes, crackles, rhonchi. No retractions. No resp. distress. No accessory muscle use. EXTR: No c/c/e NEURO Normal gait.  PSYCH: Normally interactive. Conversant. Not depressed or anxious appearing.  Calm demeanor.   Laboratory and Imaging Data: Results for orders placed or performed in visit on 09/10/17  PSA, total and free  Result Value Ref Range   PSA, Total <0.1 < OR = 4.0 ng/mL   PSA, Free <0.1 ng/mL   PSA, % Free UNABLE TO CALCULATE >25 % (calc)  Basic Metabolic Panel  Result Value Ref Range   Sodium 140 135 - 145 mEq/L   Potassium 4.6 3.5 - 5.1 mEq/L   Chloride 105 96 - 112 mEq/L   CO2 28 19 - 32 mEq/L   Glucose, Bld 114 (H) 70 - 99 mg/dL   BUN 20 6 - 23 mg/dL   Creatinine, Ser 0.92 0.40 - 1.50 mg/dL   Calcium 9.1 8.4 - 10.5 mg/dL   GFR 85.36 >60.00 mL/min  Hepatic Function Panel  Result Value Ref Range   Total Bilirubin 0.3 0.2 - 1.2 mg/dL   Bilirubin, Direct 0.1 0.0 - 0.3 mg/dL   Alkaline Phosphatase 65 39 - 117 U/L   AST 20 0 - 37 U/L   ALT 22 0 - 53 U/L   Total Protein 6.7 6.0 - 8.3 g/dL   Albumin 4.2 3.5 - 5.2 g/dL  CBC with Differential/Platelet  Result Value Ref Range   WBC 4.8 4.0 - 10.5 K/uL   RBC 5.06 4.22 - 5.81 Mil/uL   Hemoglobin 14.9 13.0 - 17.0 g/dL   HCT 45.1 39.0 - 52.0 %   MCV 89.0 78.0 - 100.0 fl   MCHC 33.1 30.0 - 36.0 g/dL   RDW 13.8 11.5 - 15.5 %   Platelets 206.0 150.0 - 400.0 K/uL   Neutrophils Relative % 63.6 43.0 - 77.0 %   Lymphocytes Relative 22.6 12.0 - 46.0 %   Monocytes Relative 9.8 3.0 - 12.0 %   Eosinophils Relative 3.5 0.0 - 5.0 %   Basophils Relative 0.5 0.0 - 3.0 %   Neutro Abs 3.1 1.4 - 7.7 K/uL   Lymphs Abs 1.1 0.7 - 4.0 K/uL   Monocytes Absolute 0.5 0.1 - 1.0 K/uL   Eosinophils Absolute 0.2 0.0 - 0.7  K/uL    Basophils Absolute 0.0 0.0 - 0.1 K/uL  Lipid Panel  Result Value Ref Range   Cholesterol 184 0 - 200 mg/dL   Triglycerides 134.0 0.0 - 149.0 mg/dL   HDL 32.10 (L) >39.00 mg/dL   VLDL 26.8 0.0 - 40.0 mg/dL   LDL Cholesterol 125 (H) 0 - 99 mg/dL   Total CHOL/HDL Ratio 6    NonHDL 152.00      Assessment and Plan:   Gastroesophageal reflux disease without esophagitis - Plan: omeprazole (PRILOSEC) 40 MG capsule  Hyperlipidemia LDL goal <100  Prostate cancer, in remission  Doing well overall  Work on sugar intake, keep working out  Follow-up: No Follow-up on file.  Meds ordered this encounter  Medications  . omeprazole (PRILOSEC) 40 MG capsule    Sig: Take 1 capsule (40 mg total) by mouth daily.    Dispense:  90 capsule    Refill:  3   Signed,  Eleanora Guinyard T. Josia Cueva, MD   Allergies as of 09/19/2017   No Known Allergies     Medication List        Accurate as of 09/19/17 10:28 AM. Always use your most recent med list.          aspirin 81 MG tablet Take 81 mg by mouth daily.   b complex vitamins tablet Take 1 tablet by mouth daily.   cyanocobalamin 2000 MCG tablet Take 2,000 mcg by mouth daily.   IBU 800 MG tablet Generic drug:  ibuprofen take 1 tablet by mouth every 6 to 8 hours if needed for pain   nystatin 100000 UNIT/ML suspension Commonly known as:  MYCOSTATIN Take 5 mLs (500,000 Units total) by mouth 4 (four) times daily.   omeprazole 40 MG capsule Commonly known as:  PRILOSEC Take 1 capsule (40 mg total) by mouth daily.   Red Yeast Rice 600 MG Caps Take 1 capsule by mouth daily.   vitamin E 400 UNIT capsule Generic drug:  vitamin E Take 400 Units by mouth daily.   zoster vaccine live (PF) 19400 UNT/0.65ML injection Commonly known as:  ZOSTAVAX Inject 19,400 Units into the skin once.

## 2017-10-05 DIAGNOSIS — L821 Other seborrheic keratosis: Secondary | ICD-10-CM | POA: Diagnosis not present

## 2017-10-05 DIAGNOSIS — L720 Epidermal cyst: Secondary | ICD-10-CM | POA: Diagnosis not present

## 2017-10-05 DIAGNOSIS — L570 Actinic keratosis: Secondary | ICD-10-CM | POA: Diagnosis not present

## 2017-10-05 DIAGNOSIS — Z85828 Personal history of other malignant neoplasm of skin: Secondary | ICD-10-CM | POA: Diagnosis not present

## 2017-11-29 ENCOUNTER — Other Ambulatory Visit: Payer: Self-pay | Admitting: Family Medicine

## 2017-11-29 DIAGNOSIS — K219 Gastro-esophageal reflux disease without esophagitis: Secondary | ICD-10-CM

## 2018-01-03 DIAGNOSIS — L720 Epidermal cyst: Secondary | ICD-10-CM | POA: Diagnosis not present

## 2018-01-03 DIAGNOSIS — Z85828 Personal history of other malignant neoplasm of skin: Secondary | ICD-10-CM | POA: Diagnosis not present

## 2018-01-16 DIAGNOSIS — M25511 Pain in right shoulder: Secondary | ICD-10-CM | POA: Diagnosis not present

## 2018-01-16 DIAGNOSIS — M25512 Pain in left shoulder: Secondary | ICD-10-CM | POA: Diagnosis not present

## 2018-05-09 DIAGNOSIS — M67911 Unspecified disorder of synovium and tendon, right shoulder: Secondary | ICD-10-CM | POA: Diagnosis not present

## 2018-05-09 DIAGNOSIS — M542 Cervicalgia: Secondary | ICD-10-CM | POA: Diagnosis not present

## 2018-05-09 DIAGNOSIS — M25511 Pain in right shoulder: Secondary | ICD-10-CM | POA: Diagnosis not present

## 2018-05-09 DIAGNOSIS — Z23 Encounter for immunization: Secondary | ICD-10-CM | POA: Diagnosis not present

## 2018-05-29 ENCOUNTER — Emergency Department: Payer: Medicare Other

## 2018-05-29 ENCOUNTER — Observation Stay
Admission: EM | Admit: 2018-05-29 | Discharge: 2018-05-30 | Disposition: A | Discharge status: Home / Self Care | Payer: Medicare Other | Attending: Internal Medicine | Admitting: Internal Medicine

## 2018-05-29 DIAGNOSIS — Z96641 Presence of right artificial hip joint: Secondary | ICD-10-CM | POA: Insufficient documentation

## 2018-05-29 DIAGNOSIS — R079 Chest pain, unspecified: Secondary | ICD-10-CM

## 2018-05-29 DIAGNOSIS — J9811 Atelectasis: Secondary | ICD-10-CM | POA: Insufficient documentation

## 2018-05-29 DIAGNOSIS — I4510 Unspecified right bundle-branch block: Secondary | ICD-10-CM | POA: Diagnosis not present

## 2018-05-29 DIAGNOSIS — I200 Unstable angina: Principal | ICD-10-CM | POA: Insufficient documentation

## 2018-05-29 DIAGNOSIS — M1990 Unspecified osteoarthritis, unspecified site: Secondary | ICD-10-CM | POA: Diagnosis not present

## 2018-05-29 DIAGNOSIS — K219 Gastro-esophageal reflux disease without esophagitis: Secondary | ICD-10-CM | POA: Insufficient documentation

## 2018-05-29 DIAGNOSIS — Z9889 Other specified postprocedural states: Secondary | ICD-10-CM | POA: Insufficient documentation

## 2018-05-29 DIAGNOSIS — Z7982 Long term (current) use of aspirin: Secondary | ICD-10-CM | POA: Insufficient documentation

## 2018-05-29 DIAGNOSIS — I2584 Coronary atherosclerosis due to calcified coronary lesion: Secondary | ICD-10-CM | POA: Insufficient documentation

## 2018-05-29 DIAGNOSIS — Z79899 Other long term (current) drug therapy: Secondary | ICD-10-CM | POA: Diagnosis not present

## 2018-05-29 DIAGNOSIS — R918 Other nonspecific abnormal finding of lung field: Secondary | ICD-10-CM | POA: Diagnosis not present

## 2018-05-29 DIAGNOSIS — K449 Diaphragmatic hernia without obstruction or gangrene: Secondary | ICD-10-CM | POA: Diagnosis not present

## 2018-05-29 DIAGNOSIS — Z8042 Family history of malignant neoplasm of prostate: Secondary | ICD-10-CM | POA: Diagnosis not present

## 2018-05-29 DIAGNOSIS — E785 Hyperlipidemia, unspecified: Secondary | ICD-10-CM | POA: Insufficient documentation

## 2018-05-29 DIAGNOSIS — M47894 Other spondylosis, thoracic region: Secondary | ICD-10-CM | POA: Diagnosis not present

## 2018-05-29 DIAGNOSIS — R0789 Other chest pain: Secondary | ICD-10-CM | POA: Diagnosis not present

## 2018-05-29 DIAGNOSIS — Z8546 Personal history of malignant neoplasm of prostate: Secondary | ICD-10-CM | POA: Insufficient documentation

## 2018-05-29 DIAGNOSIS — Z9079 Acquired absence of other genital organ(s): Secondary | ICD-10-CM | POA: Diagnosis not present

## 2018-05-29 LAB — BASIC METABOLIC PANEL
Anion gap: 6 (ref 5–15)
BUN: 23 mg/dL (ref 8–23)
CO2: 25 mmol/L (ref 22–32)
Calcium: 8.7 mg/dL — ABNORMAL LOW (ref 8.9–10.3)
Chloride: 110 mmol/L (ref 98–111)
Creatinine, Ser: 0.85 mg/dL (ref 0.61–1.24)
GFR calc Af Amer: >60 mL/min (ref >60)
Glucose, Bld: 124 mg/dL — ABNORMAL HIGH (ref 70–99)
POTASSIUM: 3.8 mmol/L (ref 3.5–5.1)
SODIUM: 141 mmol/L (ref 135–145)

## 2018-05-29 LAB — CBC
HCT: 40 % (ref 39.0–52.0)
HEMOGLOBIN: 13.4 g/dL (ref 13.0–17.0)
MCH: 29.8 pg (ref 26.0–34.0)
MCHC: 33.5 g/dL (ref 30.0–36.0)
MCV: 89.1 fL (ref 80.0–100.0)
Platelets: 189 10*3/uL (ref 150–400)
RBC: 4.49 MIL/uL (ref 4.22–5.81)
RDW: 14.2 % (ref 11.5–15.5)
WBC: 4.8 10*3/uL (ref 4.0–10.5)
nRBC: 0 % (ref 0.0–0.2)

## 2018-05-29 LAB — TROPONIN I

## 2018-05-29 NOTE — ED Notes (Signed)
Pt uprite on stretcher in exam room with no distress noted; reports intermittent left sided CP since approx 3pm; has had some waves of nausea as well since yesterday; pt denies pain at present; card monitor in place; resp even/unlab, lungs clear, apical audible & regular, +BS, abd soft/nondist/nontender, +PP -edema

## 2018-05-29 NOTE — ED Triage Notes (Signed)
Patient c/o left chest pain radiating to back, left arm and neck. Patient reprots accompanying symptom of nausea.

## 2018-05-30 ENCOUNTER — Encounter
Admission: EM | Disposition: A | Discharge status: Home / Self Care | Payer: Self-pay | Source: Home / Self Care | Attending: Emergency Medicine

## 2018-05-30 ENCOUNTER — Emergency Department: Payer: Medicare Other

## 2018-05-30 ENCOUNTER — Telehealth: Payer: Self-pay

## 2018-05-30 ENCOUNTER — Other Ambulatory Visit: Payer: Self-pay

## 2018-05-30 DIAGNOSIS — R079 Chest pain, unspecified: Secondary | ICD-10-CM | POA: Diagnosis present

## 2018-05-30 DIAGNOSIS — R739 Hyperglycemia, unspecified: Secondary | ICD-10-CM | POA: Diagnosis not present

## 2018-05-30 DIAGNOSIS — I200 Unstable angina: Secondary | ICD-10-CM

## 2018-05-30 DIAGNOSIS — R0789 Other chest pain: Secondary | ICD-10-CM | POA: Diagnosis not present

## 2018-05-30 DIAGNOSIS — I208 Other forms of angina pectoris: Secondary | ICD-10-CM | POA: Diagnosis not present

## 2018-05-30 DIAGNOSIS — I2510 Atherosclerotic heart disease of native coronary artery without angina pectoris: Secondary | ICD-10-CM | POA: Diagnosis not present

## 2018-05-30 HISTORY — PX: LEFT HEART CATH AND CORONARY ANGIOGRAPHY: CATH118249

## 2018-05-30 LAB — HEPATIC FUNCTION PANEL
ALT: 23 U/L (ref 0–44)
AST: 23 U/L (ref 15–41)
Albumin: 3.8 g/dL (ref 3.5–5.0)
Alkaline Phosphatase: 61 U/L (ref 38–126)
BILIRUBIN TOTAL: 0.8 mg/dL (ref 0.3–1.2)
Total Protein: 6.6 g/dL (ref 6.5–8.1)

## 2018-05-30 LAB — TROPONIN I: Troponin I: <0.03 ng/mL (ref <0.03)

## 2018-05-30 LAB — LIPID PANEL
Cholesterol: 191 mg/dL (ref 0–200)
HDL: 31 mg/dL — ABNORMAL LOW (ref >40)
LDL CALC: 140 mg/dL — AB (ref 0–99)
Total CHOL/HDL Ratio: 6.2 RATIO
Triglycerides: 101 mg/dL (ref <150)
VLDL: 20 mg/dL (ref 0–40)

## 2018-05-30 LAB — FIBRIN DERIVATIVES D-DIMER (ARMC ONLY): Fibrin derivatives D-dimer (ARMC): 1067.58 ng/mL (FEU) — ABNORMAL HIGH (ref 0.00–499.00)

## 2018-05-30 LAB — HEMOGLOBIN A1C
Hgb A1c MFr Bld: 6 % — ABNORMAL HIGH (ref 4.8–5.6)
Mean Plasma Glucose: 125.5 mg/dL

## 2018-05-30 LAB — PHOSPHORUS: Phosphorus: 3 mg/dL (ref 2.5–4.6)

## 2018-05-30 LAB — MAGNESIUM: Magnesium: 1.9 mg/dL (ref 1.7–2.4)

## 2018-05-30 SURGERY — LEFT HEART CATH AND CORONARY ANGIOGRAPHY
Anesthesia: Moderate Sedation

## 2018-05-30 MED ORDER — MIDAZOLAM HCL 2 MG/2ML IJ SOLN
INTRAMUSCULAR | Status: AC
  Filled 2018-05-30: qty 2

## 2018-05-30 MED ORDER — SODIUM CHLORIDE 0.9 % IV SOLN: 250.0000 mL | INTRAVENOUS | Status: DC | PRN

## 2018-05-30 MED ORDER — MIDAZOLAM HCL 2 MG/2ML IJ SOLN
INTRAMUSCULAR | Status: DC | PRN
  Administered 2018-05-30: 1 mg via INTRAVENOUS

## 2018-05-30 MED ORDER — FENTANYL CITRATE (PF) 100 MCG/2ML IJ SOLN
INTRAMUSCULAR | Status: DC | PRN
  Administered 2018-05-30: 50 ug via INTRAVENOUS

## 2018-05-30 MED ORDER — SODIUM CHLORIDE 0.9 % WEIGHT BASED INFUSION
3.0000 mL/kg/h | INTRAVENOUS | Status: DC
  Administered 2018-05-30: 3 mL/kg/h via INTRAVENOUS

## 2018-05-30 MED ORDER — NITROGLYCERIN 0.4 MG SL SUBL: 0.4000 mg | SUBLINGUAL_TABLET | SUBLINGUAL | Status: DC | PRN

## 2018-05-30 MED ORDER — SODIUM CHLORIDE 0.9% FLUSH: 3.0000 mL | Freq: Two times a day (BID) | INTRAVENOUS | Status: DC

## 2018-05-30 MED ORDER — IOHEXOL 350 MG/ML SOLN
75.0000 mL | Freq: Once | INTRAVENOUS | Status: AC | PRN
  Administered 2018-05-30: 75 mL via INTRAVENOUS

## 2018-05-30 MED ORDER — FENTANYL CITRATE (PF) 100 MCG/2ML IJ SOLN
INTRAMUSCULAR | Status: AC
  Filled 2018-05-30: qty 2

## 2018-05-30 MED ORDER — ACETAMINOPHEN 325 MG PO TABS: 650.0000 mg | ORAL_TABLET | ORAL | Status: DC | PRN

## 2018-05-30 MED ORDER — BISACODYL 5 MG PO TBEC: 5.0000 mg | DELAYED_RELEASE_TABLET | Freq: Every day | ORAL | Status: DC | PRN

## 2018-05-30 MED ORDER — HEPARIN (PORCINE) IN NACL 1000-0.9 UT/500ML-% IV SOLN
INTRAVENOUS | Status: AC
  Filled 2018-05-30: qty 1000

## 2018-05-30 MED ORDER — MORPHINE SULFATE (PF) 2 MG/ML IV SOLN: 2.0000 mg | INTRAVENOUS | Status: DC | PRN

## 2018-05-30 MED ORDER — HEPARIN SODIUM (PORCINE) 1000 UNIT/ML IJ SOLN
INTRAMUSCULAR | Status: DC | PRN
  Administered 2018-05-30: 4000 [IU] via INTRAVENOUS

## 2018-05-30 MED ORDER — SODIUM CHLORIDE 0.9 % IV SOLN: INTRAVENOUS | Status: AC

## 2018-05-30 MED ORDER — VERAPAMIL HCL 2.5 MG/ML IV SOLN
INTRAVENOUS | Status: DC | PRN
  Administered 2018-05-30: 2.5 mg via INTRA_ARTERIAL

## 2018-05-30 MED ORDER — ASPIRIN 81 MG PO CHEW: 81.0000 mg | CHEWABLE_TABLET | Freq: Every day | ORAL | Status: DC

## 2018-05-30 MED ORDER — SODIUM CHLORIDE 0.9% FLUSH: 3.0000 mL | INTRAVENOUS | Status: DC | PRN

## 2018-05-30 MED ORDER — ASPIRIN 81 MG PO CHEW
324.0000 mg | CHEWABLE_TABLET | Freq: Once | ORAL | Status: AC
  Administered 2018-05-30: 324 mg via ORAL
  Filled 2018-05-30: qty 4

## 2018-05-30 MED ORDER — SENNOSIDES-DOCUSATE SODIUM 8.6-50 MG PO TABS: 1.0000 | ORAL_TABLET | Freq: Every evening | ORAL | Status: DC | PRN

## 2018-05-30 MED ORDER — SODIUM CHLORIDE 0.9 % WEIGHT BASED INFUSION: 1.0000 mL/kg/h | INTRAVENOUS | Status: DC

## 2018-05-30 MED ORDER — VERAPAMIL HCL 2.5 MG/ML IV SOLN
INTRAVENOUS | Status: AC
  Filled 2018-05-30: qty 2

## 2018-05-30 MED ORDER — ONDANSETRON HCL 4 MG/2ML IJ SOLN: 4.0000 mg | Freq: Four times a day (QID) | INTRAMUSCULAR | Status: DC | PRN

## 2018-05-30 MED ORDER — PANTOPRAZOLE SODIUM 40 MG PO TBEC: 40.0000 mg | DELAYED_RELEASE_TABLET | Freq: Every day | ORAL | Status: DC

## 2018-05-30 MED ORDER — HEPARIN SODIUM (PORCINE) 1000 UNIT/ML IJ SOLN
INTRAMUSCULAR | Status: AC
  Filled 2018-05-30: qty 1

## 2018-05-30 MED ORDER — ENOXAPARIN SODIUM 40 MG/0.4ML ~~LOC~~ SOLN: 40.0000 mg | SUBCUTANEOUS | Status: DC

## 2018-05-30 SURGICAL SUPPLY — 8 items
CATH INFINITI 5FR JK (CATHETERS) ×3 IMPLANT
DEVICE INFLAT 30 PLUS (MISCELLANEOUS) IMPLANT
DEVICE RAD TR BAND REGULAR (VASCULAR PRODUCTS) ×3 IMPLANT
GLIDESHEATH SLEND SS 6F .021 (SHEATH) ×3 IMPLANT
KIT MANI 3VAL PERCEP (MISCELLANEOUS) ×3 IMPLANT
PACK CARDIAC CATH (CUSTOM PROCEDURE TRAY) ×3 IMPLANT
WIRE HITORQ VERSACORE ST 145CM (WIRE) ×3 IMPLANT
WIRE ROSEN-J .035X260CM (WIRE) ×3 IMPLANT

## 2018-05-30 NOTE — Discharge Summary (Signed)
[place] at [facility], 77 y.o., DOB Oct 05, 1940, MRN [id number]. Admission date: 05/29/2018 Discharge Date 05/30/2018 Primary MD Owens Loffler, MD Admitting Physician Arta Silence, MD  Admission Diagnosis  Chest pain, unspecified type [R07.9]  Discharge Diagnosis   Active Problems:   Chest pain non cardiac   gerd   hyperlipdemia H/o prostate cancer rbbb       Hospital Course   Pt is 77y/o presented with cp was seen by cardiology and underwent cath due to concerning symptoms, coronary calcifations on ct of chest. Cath showed minor irrgularites but no significant obstrucion. His pain in non cardiac likey muskulosketal related.            Consults  cardiology  Significant Tests:  See full reports for all details      Dg Chest 2 View  Result Date: 05/29/2018 CLINICAL DATA:  Left chest pain radiating to the back, left arm, and neck. Nausea. EXAM: CHEST - 2 VIEW COMPARISON:  09/29/2005 chest radiograph FINDINGS: Mild scarring or atelectasis along the left lateral costophrenic angle. Lungs appear otherwise clear. Postoperative findings at the left [place] joint. Rotator cuff anchors in the right humeral head. Thoracic spondylosis. No pleural effusion. Cardiac and mediastinal margins appear normal. IMPRESSION: 1. A specific cause for patient's symptoms is not identified. 2. Subsegmental atelectasis at the left lung base. 3. Postoperative findings in both shoulders. 4. Thoracic spondylosis. Electronically Signed   By: Van Clines M.D.   On: 05/29/2018 23:31   Ct Angio Chest Pe W And/or Wo Contrast  Result Date: 05/30/2018 CLINICAL DATA:  Intermittent chest pain since 3 p.m. on the left EXAM: CT ANGIOGRAPHY CHEST WITH CONTRAST TECHNIQUE: Multidetector CT imaging of the chest was performed using the standard protocol during bolus administration of intravenous contrast. Multiplanar CT image reconstructions and MIPs were obtained  to evaluate the vascular anatomy. CONTRAST:  87mL OMNIPAQUE IOHEXOL 350 MG/ML SOLN COMPARISON:  Chest radiograph 05/29/2018 FINDINGS: Cardiovascular: No filling defect is identified in the pulmonary arterial tree to suggest pulmonary embolus. Left anterior descending and circumflex coronary artery atherosclerotic calcification with mild cardiomegaly. Mediastinum/Nodes: Small type 1 hiatal hernia. Lungs/Pleura: Ground-glass densities with some dependency raising suspicion for subtle edema. A 0.7 by 0.6 cm right upper lobe nodule is present on image 21/6. Upper Abdomen: Unremarkable Musculoskeletal: Degenerative glenohumeral arthropathy bilaterally. Degenerative bilateral sternoclavicular arthropathy. Thoracic spondylosis. Review of the MIP images confirms the above findings. IMPRESSION: 1. No filling defect is identified in the pulmonary arterial tree to suggest pulmonary embolus. 2. 7 by 6 mm right upper lobe pulmonary nodule. Non-contrast chest CT at 6-12 months is recommended. If the nodule is stable at time of repeat CT, then future CT at 18-24 months (from today's scan) is considered optional for low-risk patients, but is recommended for high-risk patients. This recommendation follows the consensus statement: Guidelines for Management of Incidental Pulmonary Nodules Detected on CT Images: From the Fleischner Society 2017; Radiology 2017; 284:228-243. 3. Left anterior descending and circumflex coronary artery atherosclerotic calcification. 4. Small type 1 hiatal hernia. 5. Mild cardiomegaly, with some hazy ground-glass opacities with dependency in the right upper lobe suspicious for subtle edema. Electronically Signed   By: Van Clines M.D.   On: 05/30/2018 02:23       Today   Subjective:   Scott [name]  Feel well  Objective:   Blood pressure 114/62, pulse 65, temperature 97.7 F (36.5 C), temperature source Oral, resp. rate 12, height 5\' 9"  (1.753  m), weight 78 kg, SpO2 94 %.   .  Intake/Output Summary (Last 24 hours) at 05/30/2018 1412 Last data filed at 05/30/2018 1021 Gross per 24 hour  Intake 120 ml  Output -  Net 120 ml    Exam VITAL SIGNS: Blood pressure 114/62, pulse 65, temperature 97.7 F (36.5 C), temperature source Oral, resp. rate 12, height 5\' 9"  (1.753 m), weight 78 kg, SpO2 94 %.  GENERAL:  77 y.o.-year-old patient lying in the bed with no acute distress.  EYES: Pupils equal, round, reactive to light and accommodation. No scleral icterus. Extraocular muscles intact.  HEENT: Head atraumatic, normocephalic. Oropharynx and nasopharynx clear.  NECK:  Supple, no jugular venous distention. No thyroid enlargement, no tenderness.  LUNGS: Normal breath sounds bilaterally, no wheezing, rales,rhonchi or crepitation. No use of accessory muscles of respiration.  CARDIOVASCULAR: S1, S2 normal. No murmurs, rubs, or gallops.  ABDOMEN: Soft, nontender, nondistended. Bowel sounds present. No organomegaly or mass.  EXTREMITIES: No pedal edema, cyanosis, or clubbing.  NEUROLOGIC: Cranial nerves II through XII are intact. Muscle strength 5/5 in all extremities. Sensation intact. Gait not checked.  PSYCHIATRIC: The patient is alert and oriented x 3.  SKIN: No obvious rash, lesion, or ulcer.   Data Review     CBC w Diff:  Lab Results  Component Value Date   WBC 4.8 05/29/2018   HGB 13.4 05/29/2018   HCT 40.0 05/29/2018   PLT 189 05/29/2018   LYMPHOPCT 22.6 09/10/2017   MONOPCT 9.8 09/10/2017   EOSPCT 3.5 09/10/2017   BASOPCT 0.5 09/10/2017   CMP:  Lab Results  Component Value Date   NA 141 05/29/2018   K 3.8 05/29/2018   CL 110 05/29/2018   CO2 25 05/29/2018   BUN 23 05/29/2018   CREATININE 0.85 05/29/2018   PROT 6.6 05/30/2018   ALBUMIN 3.8 05/30/2018   BILITOT 0.8 05/30/2018   ALKPHOS 61 05/30/2018   AST 23 05/30/2018   ALT 23 05/30/2018  .  Micro Results No results found for this or any previous visit (from the past 240  hour(s)).      Code Status Orders  (From admission, onward)         Start     Ordered   05/30/18 0815  Full code  Continuous     05/30/18 0814        Code Status History    This patient has a current code status but no historical code status.    Advance Directive Documentation     Most Recent Value  Type of Advance Directive  Healthcare Power of Attorney, Living will  Pre-existing out of facility DNR order (yellow form or pink MOST form)  -  "MOST" Form in Place?  -          Follow-up Information    Wellington Hampshire, MD Today.   Specialty:  Cardiology Contact information: [place] [address] [phone]           Discharge Medications   Allergies as of 05/30/2018   No Known Allergies     Medication List    TAKE these medications   aspirin 81 MG tablet Take 81 mg by mouth daily. Notes to patient:  NONE GIVEN TODAY   b complex vitamins tablet Take 1 tablet by mouth daily. Notes to patient:  NONE GIVEN TODAY   cyanocobalamin 2000 MCG tablet Take 2,000 mcg by mouth daily. Notes to patient:  NONE GIVEN TODAY  IBU 800 MG tablet Generic drug:  ibuprofen take 1 tablet by mouth every 6 to 8 hours if needed for pain   nystatin 100000 UNIT/ML suspension Commonly known as:  MYCOSTATIN Take 5 mLs (500,000 Units total) by mouth 4 (four) times daily. Notes to patient:  NONE GIVEN TODAY   omeprazole 40 MG capsule Commonly known as:  PRILOSEC Take 1 capsule (40 mg total) by mouth daily. Notes to patient:  NONE GIVEN TODAY   Red Yeast Rice 600 MG Caps Take 1 capsule by mouth daily. Notes to patient:  NONE GIVEN TODAY   vitamin E 400 UNIT capsule Generic drug:  vitamin E Take 400 Units by mouth daily. Notes to patient:  NONE GIVEN TODAY          Total Time in preparing paper work, data evaluation and todays exam - 48 minutes  Dustin Flock M.D on 05/30/2018 at 2:12 PM [place]   [phone]

## 2018-05-30 NOTE — H&P (Signed)
Scott [name] at [place] NAME: Scott [name]    MR#:  [id number]  DATE OF BIRTH:  11-03-1940  DATE OF ADMISSION:  05/29/2018  PRIMARY CARE PHYSICIAN: Owens Loffler, MD   REQUESTING/REFERRING PHYSICIAN: Gregor Hams, MD  CHIEF COMPLAINT:   Chief Complaint  Patient presents with  . Chest Pain    HISTORY OF PRESENT ILLNESS:  Scott [name]  is a 77 y.o. male with a known history of GERD p/w CP. Pt states he was in his usual state of health up until ~1500PM on Wednesday 05/29/2018. Pt states he was at rest (he believes he was watching television) when he developed L-sided CP surrounding the L nipple. I ask him in multiple different ways, but he is unable to characterize the pain. He says it is, "just pain." He endorses associated SOB and nausea, but denies diaphoresis or vomiting. Pain is reported to be exertional, significantly worse w/ activity and improving w/ rest. He noted some radiation to the L arm and L neck. Pain is otherwise non-reproducible w/ palpation, non-pleuritic and non-positional. He states he took a full-dose ASA when he developed CP. He states he normally takes ASA81 qD, and has been doing so for ~85yrs. He is a lifelong non-smoker. He endorses (+) FHx CAD/MI in both parents. He does not have a known Hx of DM or HLD. Pt states having (-) stress test in 2013. CTA chest performed in ED (+) coronary calcification. Case was d/w cardiology (Dr. Fletcher Anon) by ED provider (Dr. Owens Shark), and pt was cleared for outpt f/up, but he tells me he wishes to stay in the hospital for inpt Cardiology evaluation. He appears to desire a cardiac catheterization. He is well-appearing, and is not in any acute distress. He has a relatively normal physical exam. He is otherwise w/o complaint, and is pain-free at the time of my assessment. He is stoic, a "man of few words".  PAST MEDICAL HISTORY:   Past Medical History:  Diagnosis Date  . Allergic rhinitis  due to pollen   . Arthritis   . GERD (gastroesophageal reflux disease)   . Hyperlipidemia   . Prostate cancer ([place])    cancer  . Right bundle branch block    RBBB    PAST SURGICAL HISTORY:   Past Surgical History:  Procedure Laterality Date  . APPENDECTOMY    . HERNIA REPAIR  85,03   rt and lt ing hernia  . KNEE ARTHROSCOPY  2012   left  . prostatectomy  1998  . ROTATOR CUFF REPAIR     right  . SHOULDER ARTHROSCOPY Right 01/29/2013   Procedure: RIGHT ARTHROSCOPY SHOULDER WITH DEBRIDEMENT, POSSIBLE ROTATOR CUFF REPAIR;  Surgeon: Kerin Salen, MD;  Location: [facility];  Service: Orthopedics;  Laterality: Right;  . TONSILLECTOMY    . TOTAL HIP ARTHROPLASTY  2007   right     SOCIAL HISTORY:   Social History   Tobacco Use  . Smoking status: Never Smoker  . Smokeless tobacco: Never Used  Substance Use Topics  . Alcohol use: No    FAMILY HISTORY:   Family History  Problem Relation Age of Onset  . Cancer Father        prostate  . Cancer Brother        prostate  . Cancer Brother        prostate    DRUG ALLERGIES:  No Known Allergies  REVIEW OF SYSTEMS:   Review of  Systems  Constitutional: Negative for chills, diaphoresis, fever, malaise/fatigue and weight loss.  HENT: Negative for congestion, ear pain, hearing loss, nosebleeds, sinus pain, sore throat and tinnitus.   Eyes: Negative for blurred vision, double vision and photophobia.  Respiratory: Positive for shortness of breath. Negative for cough, hemoptysis, sputum production and wheezing.   Cardiovascular: Positive for chest pain. Negative for palpitations, orthopnea, claudication, leg swelling and PND.  Gastrointestinal: Positive for nausea. Negative for abdominal pain, blood in stool, constipation, diarrhea, heartburn, melena and vomiting.  Genitourinary: Negative for dysuria, frequency, hematuria and urgency.  Musculoskeletal: Negative for back pain, falls, joint pain, myalgias and neck  pain.  Skin: Negative for itching and rash.  Neurological: Negative for dizziness, tingling, tremors, sensory change, speech change, focal weakness, seizures, loss of consciousness, weakness and headaches.  Psychiatric/Behavioral: Negative for memory loss. The patient does not have insomnia.    MEDICATIONS AT HOME:   Prior to Admission medications   Medication Sig Start Date End Date Taking? Authorizing Provider  aspirin 81 MG tablet Take 81 mg by mouth daily.      [provider]  b complex vitamins tablet Take 1 tablet by mouth daily.    [provider]  cyanocobalamin 2000 MCG tablet Take 2,000 mcg by mouth daily.    [provider]  IBU 800 MG tablet take 1 tablet by mouth every 6 to 8 hours if needed for pain 09/18/17   [provider]  nystatin (MYCOSTATIN) 100000 UNIT/ML suspension Take 5 mLs (500,000 Units total) by mouth 4 (four) times daily. 09/20/16   Jearld Fenton, NP  omeprazole (PRILOSEC) 40 MG capsule Take 1 capsule (40 mg total) by mouth daily. 09/19/17   Copland, Frederico Hamman, MD  Red Yeast Rice 600 MG CAPS Take 1 capsule by mouth daily.      [provider]  vitamin E (VITAMIN E) 400 UNIT capsule Take 400 Units by mouth daily.    [provider]  zoster vaccine live, PF, (ZOSTAVAX) 28315 UNT/0.65ML injection Inject 19,400 Units into the skin once. 05/05/15   Copland, Frederico Hamman, MD      VITAL SIGNS:  Blood pressure 123/75, pulse 60, temperature 97.7 F (36.5 C), temperature source Oral, resp. rate 10, height 5\' 9"  (1.753 m), weight 78 kg, SpO2 98 %.  PHYSICAL EXAMINATION:  Physical Exam  Constitutional: He is oriented to person, place, and time. He appears well-developed and well-nourished. He is active and cooperative.  Non-toxic appearance. He does not have a sickly appearance. He does not appear ill. No distress. He is not intubated.  HENT:  Head: Normocephalic and atraumatic.  Mouth/Throat: Oropharynx is clear and moist.  No oropharyngeal exudate.  Eyes: Conjunctivae, EOM and lids are normal. No scleral icterus.  Neck: Neck supple. No JVD present. No thyromegaly present.  Cardiovascular: Normal rate, regular rhythm, S1 normal, S2 normal and normal heart sounds.  No extrasystoles are present. Exam reveals no gallop, no S3, no S4, no distant heart sounds and no friction rub.  No murmur heard. Pulmonary/Chest: Effort normal and breath sounds normal. No accessory muscle usage or stridor. No apnea, no tachypnea and no bradypnea. He is not intubated. No respiratory distress. He has no decreased breath sounds. He has no wheezes. He has no rhonchi. He has no rales.  Abdominal: Soft. Bowel sounds are normal. He exhibits no distension. There is no tenderness. There is no rigidity, no rebound and no guarding.  Musculoskeletal: Normal range of motion. He exhibits no edema or  tenderness.       Right lower leg: Normal. He exhibits no tenderness and no edema.       Left lower leg: Normal. He exhibits no tenderness and no edema.  Lymphadenopathy:    He has no cervical adenopathy.  Neurological: He is alert and oriented to person, place, and time. He is not disoriented.  Skin: Skin is warm, dry and intact. No rash noted. He is not diaphoretic. No erythema. No pallor.  Psychiatric: Judgment and thought content normal. His mood appears not anxious. His affect is blunt. His affect is not angry, not labile and not inappropriate. His speech is not rapid and/or pressured, not delayed, not tangential and not slurred. He is withdrawn. He is not agitated, not aggressive, not hyperactive, not slowed, not actively hallucinating and not combative. Cognition and memory are normal. Cognition and memory are not impaired. He does not express impulsivity or inappropriate judgment. He does not exhibit a depressed mood. He is communicative. He exhibits normal recent memory and normal remote memory. He is attentive.   LABORATORY PANEL:   CBC Recent  Labs  Lab 05/29/18 2322  WBC 4.8  HGB 13.4  HCT 40.0  PLT 189   ------------------------------------------------------------------------------------------------------------------  Chemistries  Recent Labs  Lab 05/29/18 2322  NA 141  K 3.8  CL 110  CO2 25  GLUCOSE 124*  BUN 23  CREATININE 0.85  CALCIUM 8.7*   ------------------------------------------------------------------------------------------------------------------  Cardiac Enzymes Recent Labs  Lab 05/30/18 0235  TROPONINI <0.03   ------------------------------------------------------------------------------------------------------------------  RADIOLOGY:  Dg Chest 2 View  Result Date: 05/29/2018 CLINICAL DATA:  Left chest pain radiating to the back, left arm, and neck. Nausea. EXAM: CHEST - 2 VIEW COMPARISON:  09/29/2005 chest radiograph FINDINGS: Mild scarring or atelectasis along the left lateral costophrenic angle. Lungs appear otherwise clear. Postoperative findings at the left [facility] joint. Rotator cuff anchors in the right humeral head. Thoracic spondylosis. No pleural effusion. Cardiac and mediastinal margins appear normal. IMPRESSION: 1. A specific cause for patient's symptoms is not identified. 2. Subsegmental atelectasis at the left lung base. 3. Postoperative findings in both shoulders. 4. Thoracic spondylosis. Electronically Signed   By: Van Clines M.D.   On: 05/29/2018 23:31   Ct Angio Chest Pe W And/or Wo Contrast  Result Date: 05/30/2018 CLINICAL DATA:  Intermittent chest pain since 3 p.m. on the left EXAM: CT ANGIOGRAPHY CHEST WITH CONTRAST TECHNIQUE: Multidetector CT imaging of the chest was performed using the standard protocol during bolus administration of intravenous contrast. Multiplanar CT image reconstructions and MIPs were obtained to evaluate the vascular anatomy. CONTRAST:  16mL OMNIPAQUE IOHEXOL 350 MG/ML SOLN COMPARISON:  Chest radiograph 05/29/2018 FINDINGS: Cardiovascular: No filling  defect is identified in the pulmonary arterial tree to suggest pulmonary embolus. Left anterior descending and circumflex coronary artery atherosclerotic calcification with mild cardiomegaly. Mediastinum/Nodes: Small type 1 hiatal hernia. Lungs/Pleura: Ground-glass densities with some dependency raising suspicion for subtle edema. A 0.7 by 0.6 cm right upper lobe nodule is present on image 21/6. Upper Abdomen: Unremarkable Musculoskeletal: Degenerative glenohumeral arthropathy bilaterally. Degenerative bilateral sternoclavicular arthropathy. Thoracic spondylosis. Review of the MIP images confirms the above findings. IMPRESSION: 1. No filling defect is identified in the pulmonary arterial tree to suggest pulmonary embolus. 2. 7 by 6 mm right upper lobe pulmonary nodule. Non-contrast chest CT at 6-12 months is recommended. If the nodule is stable at time of repeat CT, then future CT at 18-24 months (from today's scan) is considered optional for low-risk patients, but is  recommended for high-risk patients. This recommendation follows the consensus statement: Guidelines for Management of Incidental Pulmonary Nodules Detected on CT Images: From the Fleischner Society 2017; Radiology 2017; 284:228-243. 3. Left anterior descending and circumflex coronary artery atherosclerotic calcification. 4. Small type 1 hiatal hernia. 5. Mild cardiomegaly, with some hazy ground-glass opacities with dependency in the right upper lobe suspicious for subtle edema. Electronically Signed   By: Van Clines M.D.   On: 05/30/2018 02:23   IMPRESSION AND PLAN:   A/P: 29M CP, CTA chest (+) coronary calcification. Hyperglycemia, hypocalcemia. -CP: Pt p/w narrative suspicious for typical cardiac CP, stable angina. Trop-I (-) x2, rpt pending. EKG (-) STEMI. CTA chest (-) PE, (+) RUL nodule, (+) coronary calcification. Tele, cardiac monitoring. ASA, morphine, NTG, O2. Cardiology consult. Lipids, HbA1c for risk stratification/modification.  NPO. -Hyperglycemia: HbA1c. -Hypocalcemia: Ionized calcium -c/w home meds. -FEN/GI: NPO for now. -DVT PPx: Lovenox. -Code status: Full code. -Disposition: Observation, < 2 midnights.   All the records are reviewed and case discussed with ED provider. Management plans discussed with the patient, family and they are in agreement.  CODE STATUS: Full code.  TOTAL TIME TAKING CARE OF THIS PATIENT: 75 minutes.    Arta Silence M.D on 05/30/2018 at 7:38 AM  Between 7am to 6pm - Pager - [phone]  After 6pm go to [email or web address] - Proofreader  [facility]  Office  [phone]  CC: Primary care physician; Owens Loffler, MD   Note: This dictation was prepared with Dragon dictation along with smaller phrase technology. Any transcriptional errors that result from this process are unintentional.

## 2018-05-30 NOTE — ED Provider Notes (Signed)
[facility] Emergency Department Provider Note    First MD Initiated Contact with Patient 05/29/18 2359     (approximate)  I have reviewed the triage vital signs and the nursing notes.   HISTORY  Chief Complaint Chest Pain    HPI Scott [name] is a 77 y.o. male with history of hyperlipidemia prostate cancer in remission right bundle branch block presents to the emergency department with intermittent left-sided chest pain with radiation to the left neck and arm which began this afternoon.  Patient states symptoms accompanied by nausea.  Patient denies any cough no dyspnea.  Patient denies any vomiting.  Patient denies any diaphoresis or dizziness.  Past Medical History:  Diagnosis Date  . Allergic rhinitis due to pollen   . Arthritis   . GERD (gastroesophageal reflux disease)   . Hyperlipidemia   . Prostate cancer ([place])    cancer  . Right bundle branch block    RBBB    Patient Active Problem List   Diagnosis Date Noted  . Prostate cancer, in remission   . Right bundle branch block   . AV BLOCK, 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  . Hyperlipidemia LDL goal <100 04/28/2010  . ALLERGIC RHINITIS 04/28/2010  . GERD 04/28/2010  . Erectile dysfunction following radical prostatectomy 04/28/2010    Past Surgical History:  Procedure Laterality Date  . APPENDECTOMY    . HERNIA REPAIR  85,03   rt and lt ing hernia  . KNEE ARTHROSCOPY  2012   left  . prostatectomy  1998  . ROTATOR CUFF REPAIR     right  . SHOULDER ARTHROSCOPY Right 01/29/2013   Procedure: RIGHT ARTHROSCOPY SHOULDER WITH DEBRIDEMENT, POSSIBLE ROTATOR CUFF REPAIR;  Surgeon: Kerin Salen, MD;  Location: [facility];  Service: Orthopedics;  Laterality: Right;  . TONSILLECTOMY    . TOTAL HIP ARTHROPLASTY  2007   right     Prior to Admission medications   Medication Sig Start Date End Date Taking? Authorizing Provider  aspirin 81 MG tablet Take 81 mg by mouth daily.       [provider]  b complex vitamins tablet Take 1 tablet by mouth daily.    [provider]  cyanocobalamin 2000 MCG tablet Take 2,000 mcg by mouth daily.    [provider]  IBU 800 MG tablet take 1 tablet by mouth every 6 to 8 hours if needed for pain 09/18/17   [provider]  nystatin (MYCOSTATIN) 100000 UNIT/ML suspension Take 5 mLs (500,000 Units total) by mouth 4 (four) times daily. 09/20/16   Jearld Fenton, NP  omeprazole (PRILOSEC) 40 MG capsule Take 1 capsule (40 mg total) by mouth daily. 09/19/17   Copland, Frederico Hamman, MD  Red Yeast Rice 600 MG CAPS Take 1 capsule by mouth daily.      [provider]  vitamin E (VITAMIN E) 400 UNIT capsule Take 400 Units by mouth daily.    [provider]  zoster vaccine live, PF, (ZOSTAVAX) 94174 UNT/0.65ML injection Inject 19,400 Units into the skin once. 05/05/15   Copland, Frederico Hamman, MD    Allergies No known drug allergies  Family History  Problem Relation Age of Onset  . Cancer Father        prostate  . Cancer Brother        prostate  . Cancer Brother        prostate    Social History Social History   Tobacco Use  . Smoking  status: Never Smoker  . Smokeless tobacco: Never Used  Substance Use Topics  . Alcohol use: No  . Drug use: No    Review of Systems Constitutional: No fever/chills Eyes: No visual changes. ENT: No sore throat. Cardiovascular: Positive for chest pain. Respiratory: Denies shortness of breath. Gastrointestinal: No abdominal pain.  No nausea, no vomiting.  No diarrhea.  No constipation. Genitourinary: Negative for dysuria. Musculoskeletal: Negative for neck pain.  Negative for back pain. Integumentary: Negative for rash. Neurological: Negative for headaches, focal weakness or numbness.   ____________________________________________   PHYSICAL EXAM:  VITAL SIGNS: ED Triage Vitals [05/29/18 2259]  Enc Vitals Group     BP 134/70     Pulse Rate 68      Resp 18     Temp 97.7 F (36.5 C)     Temp Source Oral     SpO2 100 %     Weight 78 kg (172 lb)     Height 1.753 m (5\' 9" )     Head Circumference      Peak Flow      Pain Score 3     Pain Loc      Pain Edu?      Excl. in [place]?     Constitutional: Alert and oriented. Well appearing and in no acute distress. Eyes: Conjunctivae are normal.  Mouth/Throat: Mucous membranes are moist.  Oropharynx non-erythematous. Neck: No stridor.   Cardiovascular: Normal rate, regular rhythm. Good peripheral circulation. Grossly normal heart sounds. Respiratory: Normal respiratory effort.  No retractions. Lungs CTAB. Gastrointestinal: Soft and nontender. No distention.  Musculoskeletal: No lower extremity tenderness nor edema. No gross deformities of extremities. Neurologic:  Normal speech and language. No gross focal neurologic deficits are appreciated.  Skin:  Skin is warm, dry and intact. No rash noted. Psychiatric: Mood and affect are normal. Speech and behavior are normal.  ____________________________________________   LABS (all labs ordered are listed, but only abnormal results are displayed)  Labs Reviewed  BASIC METABOLIC PANEL - Abnormal; Notable for the following components:      Result Value   Glucose, Bld 124 (*)    Calcium 8.7 (*)    All other components within normal limits  FIBRIN DERIVATIVES D-DIMER ([facility] ONLY) - Abnormal; Notable for the following components:   Fibrin derivatives D-dimer [facility]) 1,067.58 (*)    All other components within normal limits  CBC  TROPONIN I  TROPONIN I   ____________________________________________  EKG  ED ECG REPORT I, [place] Willoughby Doell, the attending physician, personally viewed and interpreted this ECG.   Date: 05/29/2018  EKG Time: 10:58 PM  Rate: 65  Rhythm: Normal sinus rhythm with a right bundle branch block  Axis: Normal  Intervals: Normal  ST&T Change: None  ____________________________________________  RADIOLOGY I,  [place] Matteo Banke, personally viewed and evaluated these images (plain radiographs) as part of my medical decision making, as well as reviewing the written report by the radiologist.  ED MD interpretation: No acute findings per radiologist on chest x-ray.  Official radiology report(s): Dg Chest 2 View  Result Date: 05/29/2018 CLINICAL DATA:  Left chest pain radiating to the back, left arm, and neck. Nausea. EXAM: CHEST - 2 VIEW COMPARISON:  09/29/2005 chest radiograph FINDINGS: Mild scarring or atelectasis along the left lateral costophrenic angle. Lungs appear otherwise clear. Postoperative findings at the left [facility] joint. Rotator cuff anchors in the right humeral head. Thoracic spondylosis. No pleural effusion. Cardiac and mediastinal margins appear normal. IMPRESSION: 1. A  specific cause for patient's symptoms is not identified. 2. Subsegmental atelectasis at the left lung base. 3. Postoperative findings in both shoulders. 4. Thoracic spondylosis. Electronically Signed   By: Van Clines M.D.   On: 05/29/2018 23:31   Ct Angio Chest Pe W And/or Wo Contrast  Result Date: 05/30/2018 CLINICAL DATA:  Intermittent chest pain since 3 p.m. on the left EXAM: CT ANGIOGRAPHY CHEST WITH CONTRAST TECHNIQUE: Multidetector CT imaging of the chest was performed using the standard protocol during bolus administration of intravenous contrast. Multiplanar CT image reconstructions and MIPs were obtained to evaluate the vascular anatomy. CONTRAST:  74mL OMNIPAQUE IOHEXOL 350 MG/ML SOLN COMPARISON:  Chest radiograph 05/29/2018 FINDINGS: Cardiovascular: No filling defect is identified in the pulmonary arterial tree to suggest pulmonary embolus. Left anterior descending and circumflex coronary artery atherosclerotic calcification with mild cardiomegaly. Mediastinum/Nodes: Small type 1 hiatal hernia. Lungs/Pleura: Ground-glass densities with some dependency raising suspicion for subtle edema. A 0.7 by 0.6 cm right upper  lobe nodule is present on image 21/6. Upper Abdomen: Unremarkable Musculoskeletal: Degenerative glenohumeral arthropathy bilaterally. Degenerative bilateral sternoclavicular arthropathy. Thoracic spondylosis. Review of the MIP images confirms the above findings. IMPRESSION: 1. No filling defect is identified in the pulmonary arterial tree to suggest pulmonary embolus. 2. 7 by 6 mm right upper lobe pulmonary nodule. Non-contrast chest CT at 6-12 months is recommended. If the nodule is stable at time of repeat CT, then future CT at 18-24 months (from today's scan) is considered optional for low-risk patients, but is recommended for high-risk patients. This recommendation follows the consensus statement: Guidelines for Management of Incidental Pulmonary Nodules Detected on CT Images: From the Fleischner Society 2017; Radiology 2017; 284:228-243. 3. Left anterior descending and circumflex coronary artery atherosclerotic calcification. 4. Small type 1 hiatal hernia. 5. Mild cardiomegaly, with some hazy ground-glass opacities with dependency in the right upper lobe suspicious for subtle edema. Electronically Signed   By: Van Clines M.D.   On: 05/30/2018 02:23    Procedures   ____________________________________________   INITIAL IMPRESSION / ASSESSMENT AND PLAN / ED COURSE  As part of my medical decision making, I reviewed the following data within the electronic MEDICAL RECORD NUMBER   77 year old male presenting with above-stated history and physical exam secondary to intermittent left-sided chest discomfort.  Considered possibly of ACS and as such EKG was performed which revealed no evidence of ischemia or infarction.  Laboratory data revealed negative troponin x2.  Also considered possibility of luminary emboli and as such d-dimer was obtained which was 1067 as such CT angiogram of the chest were performed which revealed no evidence of pulmonary emboli however did reveal atherosclerosis LAD and  circumflex arteries.  Patient remained chest pain-free at present.  Patient was made me aware of all clinical findings and I recommended the patient have outpatient follow-up with Dr. Fletcher Anon cardiologist however the patient requested to be admitted for further evaluation.  As such patient discussed with hospitalist team who admitted the patient for further inpatient evaluation of chest pain. ____________________________________________  FINAL CLINICAL IMPRESSION(S) / ED DIAGNOSES  Final diagnoses:  Chest pain, unspecified type     MEDICATIONS GIVEN DURING THIS VISIT:  Medications  iohexol (OMNIPAQUE) 350 MG/ML injection 75 mL (75 mLs Intravenous Contrast Given 05/30/18 0158)     ED Discharge Orders    None       Note:  This document was prepared using Dragon voice recognition software and may include unintentional dictation errors.    Gregor Hams, MD  06/04/18 2240  

## 2018-05-30 NOTE — Telephone Encounter (Signed)
Lmov for patient to call and schedule fu appointment  They were seen in ED on 05/29/18 for CP  Will try again at a later time

## 2018-05-30 NOTE — Care Management Note (Signed)
Case Management Note  Patient Details  Name: Scott [name] MRN: [id number] Date of Birth: 1941-04-15  Subjective/Objective:       Patient placed in observation with chest pain.  Troponins are negative.  Status post cardiac cath.  Patient presents from home and independent in all adls.  No issues accessing medical care, obtaining medications, maintaining housing, utilities and food.   No discharge needs identified at present time.               Action/Plan:   Expected Discharge Date:  05/30/18               Expected Discharge Plan:  Home/Self Care  In-House Referral:     Discharge planning Services     Post Acute Care Choice:    Choice offered to:     DME Arranged:    DME Agency:     HH Arranged:    HH Agency:     Status of Service:  Completed, signed off  If discussed at [employer] of Stay Meetings, dates discussed:    Additional Comments:  Elza Rafter, RN 05/30/2018, 2:54 PM

## 2018-05-30 NOTE — ED Notes (Addendum)
Pt ambulatory in hallway with no difficulty, no distress noted; pt awaiting arrival of transport

## 2018-05-30 NOTE — Consult Note (Signed)
Cardiology Consultation:   Patient ID: Scott [name]; [id number]; 1940-10-26   Admit date: 05/29/2018 Date of Consult: 05/30/2018  Primary Care Provider: Owens Loffler, MD Primary Cardiologist: New to [facility] - previously seen x 1 by Dr. Fletcher Anon in 2013, consult by Dr. Fletcher Anon   Patient Profile:   Scott [name] is a 77 y.o. male with a hx of RBBB, prostate cancer, HLD, and GERD who is being seen today for the evaluation of chest pain at the request of Dr. Jodell Cipro.  History of Present Illness:   Scott [name] was previously evaluated by our group in 2013 for chest pain and an abnormal EKG. At that time, he underwent treadmill Myoview that showed a fixed apical wall defect worse on rest than stress images likely secondary to attenuation artifact. No evidence of ischemia. Normal LVSF. Probably normal study. He has been lost to follow up since.   Patient has noted intermittent left-sided chest pain for the past 12 months that would typically last 2-3 hours and self resolve. Pain would come on at any time and was not solely associated with exertion or rest. Pain would occur multiple times per day at times. On 05/29/2018, around 3 PM, he developed left-sided chest pain that radiated up to the left side of his neck and left shoulder. This pain persisted into the evening hours without improvement. Due to the pain lasting longer than his prior episodes, he presented to [facility] ED for further evaluation. There has been some associated increased fatigue over the past 12 months with these chest pain episodes. No associated SOB, diaphoresis, nausea, vomiting, dizziness, presyncope, or syncope. Patient never smoked, drank alcohol, or used illegal drugs. Pain has been described as pressure and rated 4/10.   Upon the patient's arrival to [facility] they were found to have stable vitals. EKG showed known RBBB as detailed below. CXR with left lung base atelectasis. CTA chest negative for PE with an incidental  pulmonary nodule noted. There was also calcification of the LAD and LCx noted with mild cardiomegaly. Labs showed troponin negative x 3, D dimer 1067 with CTA chest as above, LFT normal, CBC normal, SCr 0.85, K+ 3.8. In the ED, he was given ASA 324 mg x 1. Currently chest pain free. He is requesting a LHC.   Past Medical History:  Diagnosis Date  . Allergic rhinitis due to pollen   . Arthritis   . GERD (gastroesophageal reflux disease)   . Hyperlipidemia   . Prostate cancer ([place])    cancer  . Right bundle branch block    RBBB    Past Surgical History:  Procedure Laterality Date  . APPENDECTOMY    . HERNIA REPAIR  85,03   rt and lt ing hernia  . KNEE ARTHROSCOPY  2012   left  . prostatectomy  1998  . ROTATOR CUFF REPAIR     right  . SHOULDER ARTHROSCOPY Right 01/29/2013   Procedure: RIGHT ARTHROSCOPY SHOULDER WITH DEBRIDEMENT, POSSIBLE ROTATOR CUFF REPAIR;  Surgeon: Kerin Salen, MD;  Location: [facility];  Service: Orthopedics;  Laterality: Right;  . TONSILLECTOMY    . TOTAL HIP ARTHROPLASTY  2007   right      Home Meds: Prior to Admission medications   Medication Sig Start Date End Date Taking? Authorizing Provider  aspirin 81 MG tablet Take 81 mg by mouth daily.      [provider]  b complex vitamins tablet Take 1 tablet by mouth daily.  [provider]  cyanocobalamin 2000 MCG tablet Take 2,000 mcg by mouth daily.    [provider]  IBU 800 MG tablet take 1 tablet by mouth every 6 to 8 hours if needed for pain 09/18/17   [provider]  nystatin (MYCOSTATIN) 100000 UNIT/ML suspension Take 5 mLs (500,000 Units total) by mouth 4 (four) times daily. 09/20/16   Jearld Fenton, NP  omeprazole (PRILOSEC) 40 MG capsule Take 1 capsule (40 mg total) by mouth daily. 09/19/17   Copland, Frederico Hamman, MD  Red Yeast Rice 600 MG CAPS Take 1 capsule by mouth daily.      [provider]  vitamin E (VITAMIN E) 400 UNIT capsule Take  400 Units by mouth daily.    [provider]    Inpatient Medications: Scheduled Meds: . aspirin  81 mg Oral Daily  . enoxaparin (LOVENOX) injection  40 mg Subcutaneous Q24H  . pantoprazole  40 mg Oral Daily   Continuous Infusions:  PRN Meds: acetaminophen, bisacodyl, morphine injection, nitroGLYCERIN, ondansetron (ZOFRAN) IV, senna-docusate  Allergies:  No Known Allergies  Social History:   Social History   Socioeconomic History  . Marital status: Married    Spouse name: Not on file  . Number of children: Not on file  . Years of education: Not on file  . Highest education level: Not on file  Occupational History  . Occupation: Herbalist: RETIRED  Social Needs  . Financial resource strain: Not on file  . Food insecurity:    Worry: Not on file    Inability: Not on file  . Transportation needs:    Medical: Not on file    Non-medical: Not on file  Tobacco Use  . Smoking status: Never Smoker  . Smokeless tobacco: Never Used  Substance and Sexual Activity  . Alcohol use: No  . Drug use: No  . Sexual activity: Not on file  Lifestyle  . Physical activity:    Days per week: Not on file    Minutes per session: Not on file  . Stress: Not on file  Relationships  . Social connections:    Talks on phone: Not on file    Gets together: Not on file    Attends religious service: Not on file    Active member of club or organization: Not on file    Attends meetings of clubs or organizations: Not on file    Relationship status: Not on file  . Intimate partner violence:    Fear of current or ex partner: Not on file    Emotionally abused: Not on file    Physically abused: Not on file    Forced sexual activity: Not on file  Other Topics Concern  . Not on file  Social History Narrative   Former competitive bodybuilder, Geophysicist/field seismologist, all natural   [employer], [place], [employer], multiple years     Family History:   Family History    Problem Relation Age of Onset  . Cancer Father        prostate  . Cancer Brother        prostate  . Cancer Brother        prostate    ROS:  Review of Systems  Constitutional: Positive for malaise/fatigue. Negative for chills, diaphoresis, fever and weight loss.  HENT: Negative for congestion.   Eyes: Negative for discharge and redness.  Respiratory: Positive for shortness of breath. Negative for cough, hemoptysis, sputum production and  wheezing.   Cardiovascular: Positive for chest pain. Negative for palpitations, orthopnea, claudication, leg swelling and PND.  Gastrointestinal: Negative for abdominal pain, blood in stool, heartburn, melena, nausea and vomiting.  Genitourinary: Negative for hematuria.  Musculoskeletal: Positive for neck pain. Negative for falls and myalgias.  Skin: Negative for rash.  Neurological: Positive for weakness. Negative for dizziness, tingling, tremors, sensory change, speech change, focal weakness and loss of consciousness.  Endo/Heme/Allergies: Does not bruise/bleed easily.  Psychiatric/Behavioral: Negative for substance abuse. The patient is not nervous/anxious.   All other systems reviewed and are negative.     Physical Exam/Data:   Vitals:   05/30/18 0430 05/30/18 0500 05/30/18 0530 05/30/18 0730  BP: 128/85  123/75 135/84  Pulse: 65  60 63  Resp: (!) 27  10 16   Temp:      TempSrc:      SpO2: 97% 97% 98% 98%  Weight:      Height:       No intake or output data in the 24 hours ending 05/30/18 0815 Filed Weights   05/29/18 2259  Weight: 78 kg   Body mass index is 25.4 kg/m.   Physical Exam: General: Well developed, well nourished, in no acute distress. Head: Normocephalic, atraumatic, sclera non-icteric, no xanthomas, nares without discharge.  Neck: Negative for carotid bruits. JVD not elevated. Lungs: Clear bilaterally to auscultation without wheezes, rales, or rhonchi. Breathing is unlabored. Heart: RRR with S1 S2. No murmurs,  rubs, or gallops appreciated. Abdomen: Soft, non-tender, non-distended with normoactive bowel sounds. No hepatomegaly. No rebound/guarding. No obvious abdominal masses. Msk:  Strength and tone appear normal for age. Extremities: No clubbing or cyanosis. No edema. Distal pedal pulses are 2+ and equal bilaterally. Neuro: Alert and oriented X 3. No facial asymmetry. No focal deficit. Moves all extremities spontaneously. Psych:  Responds to questions appropriately with a normal affect.   EKG:  The EKG was personally reviewed and demonstrates: NSR, 65 bpm, 1st degree AV block, RBBB (old) Telemetry:  Telemetry was personally reviewed and demonstrates: NSR  Weights: [employer]   05/29/18 2259  Weight: 78 kg    Relevant CV Studies: Myoview 2013: Fixed apical wall defect worse on rest than stress images likely secondary to attenuation artifact. No evidence of ischemia. Normal LVSF. Probably normal study.   Laboratory Data:  Chemistry Recent Labs  Lab 05/29/18 2322  NA 141  K 3.8  CL 110  CO2 25  GLUCOSE 124*  BUN 23  CREATININE 0.85  CALCIUM 8.7*  GFRNONAA >60  GFRAA >60  ANIONGAP 6    Recent Labs  Lab 05/30/18 0727  PROT 6.6  ALBUMIN 3.8  AST 23  ALT 23  ALKPHOS 61  BILITOT 0.8   Hematology Recent Labs  Lab 05/29/18 2322  WBC 4.8  RBC 4.49  HGB 13.4  HCT 40.0  MCV 89.1  MCH 29.8  MCHC 33.5  RDW 14.2  PLT 189   Cardiac Enzymes Recent Labs  Lab 05/29/18 2322 05/30/18 0235 05/30/18 0727  TROPONINI <0.03 <0.03 <0.03   No results for input(s): TROPIPOC in the last 168 hours.  BNPNo results for input(s): BNP, PROBNP in the last 168 hours.  DDimer No results for input(s): DDIMER in the last 168 hours.  Radiology/Studies:  Dg Chest 2 View  Result Date: 05/29/2018 IMPRESSION: 1. A specific cause for patient's symptoms is not identified. 2. Subsegmental atelectasis at the left lung base. 3. Postoperative findings in both shoulders. 4. Thoracic  spondylosis. Electronically Signed  By: Van Clines M.D.   On: 05/29/2018 23:31   Ct Angio Chest Pe W And/or Wo Contrast  Result Date: 05/30/2018 IMPRESSION: 1. No filling defect is identified in the pulmonary arterial tree to suggest pulmonary embolus. 2. 7 by 6 mm right upper lobe pulmonary nodule. Non-contrast chest CT at 6-12 months is recommended. If the nodule is stable at time of repeat CT, then future CT at 18-24 months (from today's scan) is considered optional for low-risk patients, but is recommended for high-risk patients. This recommendation follows the consensus statement: Guidelines for Management of Incidental Pulmonary Nodules Detected on CT Images: From the Fleischner Society 2017; Radiology 2017; 284:228-243. 3. Left anterior descending and circumflex coronary artery atherosclerotic calcification. 4. Small type 1 hiatal hernia. 5. Mild cardiomegaly, with some hazy ground-glass opacities with dependency in the right upper lobe suspicious for subtle edema. Electronically Signed   By: Van Clines M.D.   On: 05/30/2018 02:23    Assessment and Plan:   1. Unstable angina: -Currently chest pain free -Has ruled out -He does not want a stress test and requests a LHC -Schedule LHC this morning with Dr. Fletcher Anon -Risks and benefits of cardiac catheterization have been discussed with the patient including risks of bleeding, bruising, infection, kidney damage, stroke, heart attack, and death. The patient understands these risks and is willing to proceed with the procedure. All questions have been answered and concerns listened to -Check lipid and A1c for further risk stratification  -D dimer elevated, though CTA chest negative for PE with calcification noted of the LAD and LCx on imaging   2. HLD: -Lipid panel pending -Escalate statin as needed  3. Hyperglycemia: -A1c  4. Pulmonary nodule: -Will need outpatient follow up with PCP   For questions or updates, please  contact [place] Please consult [email or web address] for contact info under Cardiology/STEMI.   Signed, Christell Faith, PA-C [facility] Pager: [phone] 05/30/2018, 8:15 AM

## 2018-05-31 LAB — CALCIUM, IONIZED: Calcium, Ionized, Serum: 4.8 mg/dL (ref 4.5–5.6)

## 2018-05-31 NOTE — Telephone Encounter (Signed)
Patient is scheduled for 08/06/18 with Dr Fletcher Anon

## 2018-07-01 DIAGNOSIS — T1501XA Foreign body in cornea, right eye, initial encounter: Secondary | ICD-10-CM | POA: Diagnosis not present

## 2018-07-01 DIAGNOSIS — H40013 Open angle with borderline findings, low risk, bilateral: Secondary | ICD-10-CM | POA: Diagnosis not present

## 2018-07-01 DIAGNOSIS — H43393 Other vitreous opacities, bilateral: Secondary | ICD-10-CM | POA: Diagnosis not present

## 2018-07-01 DIAGNOSIS — H1131 Conjunctival hemorrhage, right eye: Secondary | ICD-10-CM | POA: Diagnosis not present

## 2018-07-01 DIAGNOSIS — Z961 Presence of intraocular lens: Secondary | ICD-10-CM | POA: Diagnosis not present

## 2018-07-03 DIAGNOSIS — T1501XA Foreign body in cornea, right eye, initial encounter: Secondary | ICD-10-CM | POA: Diagnosis not present

## 2018-07-03 DIAGNOSIS — H01003 Unspecified blepharitis right eye, unspecified eyelid: Secondary | ICD-10-CM | POA: Diagnosis not present

## 2018-07-03 DIAGNOSIS — H40013 Open angle with borderline findings, low risk, bilateral: Secondary | ICD-10-CM | POA: Diagnosis not present

## 2018-07-03 DIAGNOSIS — H1131 Conjunctival hemorrhage, right eye: Secondary | ICD-10-CM | POA: Diagnosis not present

## 2018-08-06 ENCOUNTER — Ambulatory Visit: Payer: Medicare Other | Admitting: Cardiovascular Disease

## 2018-08-07 ENCOUNTER — Encounter: Payer: Self-pay | Admitting: Cardiovascular Disease

## 2018-08-07 ENCOUNTER — Encounter: Payer: Self-pay | Admitting: Family Medicine

## 2018-08-07 ENCOUNTER — Ambulatory Visit (INDEPENDENT_AMBULATORY_CARE_PROVIDER_SITE_OTHER): Payer: Medicare Other | Admitting: Family Medicine

## 2018-08-07 VITALS — BP 100/64 | HR 79 | Temp 98.0°F | Ht 67.5 in | Wt 179.5 lb

## 2018-08-07 DIAGNOSIS — K5732 Diverticulitis of large intestine without perforation or abscess without bleeding: Secondary | ICD-10-CM

## 2018-08-07 DIAGNOSIS — R1032 Left lower quadrant pain: Secondary | ICD-10-CM | POA: Diagnosis not present

## 2018-08-07 LAB — POC URINALSYSI DIPSTICK (AUTOMATED)
BILIRUBIN UA: NEGATIVE
GLUCOSE UA: NEGATIVE
Ketones, UA: NEGATIVE
Leukocytes, UA: NEGATIVE
NITRITE UA: NEGATIVE
Protein, UA: NEGATIVE
RBC UA: NEGATIVE
Spec Grav, UA: >=1.03 — AB (ref 1.010–1.025)
UROBILINOGEN UA: 0.2 U/dL
pH, UA: 6 (ref 5.0–8.0)

## 2018-08-07 MED ORDER — AMOXICILLIN-POT CLAVULANATE 875-125 MG PO TABS: 1.0000 | ORAL_TABLET | Freq: Two times a day (BID) | ORAL | 0 refills | Status: AC

## 2018-08-07 NOTE — Progress Notes (Signed)
Dr. Frederico Hamman T. Shayra Anton, MD, [facility] [place], [address] Phone: [phone] Fax: [phone]  08/07/2018  Patient: Scott [name], MRN: [id number], DOB: [birth year], 77 y.o.  Primary Physician:  Owens Loffler, MD   Chief Complaint  Patient presents with  . LLQ Pain    x 10 days   Subjective:   Scott [name] is a 77 y.o. very pleasant male patient who presents with the following:  LLQ pain, some pain with eating. No changes in BM.   He is a very pleasant gentleman, and he is very well-known.  He had a colonoscopy in 2015 that was normal, and this was reviewed with him today.  He has known diverticular disease that they saw on this colonoscopy.  He has had approximately 10 days worth of some left lower quadrant pain.  Is not had any change in his bowel movements and not having any bloody bowel movements.  He is drinking and eating okay, but he has noticed that this has made his abdominal pain worse.  He is urinating okay not had any blood in his urine.  Is not having any upper respiratory symptoms.  Past Medical History, Surgical History, Social History, Family History, Problem List, Medications, and Allergies have been reviewed and updated if relevant.  Patient Active Problem List   Diagnosis Date Noted  . Chest pain 05/30/2018  . Unstable angina ([place])   . Prostate cancer, in remission   . Right bundle branch block   . AV BLOCK, 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  . Hyperlipidemia LDL goal <100 04/28/2010  . ALLERGIC RHINITIS 04/28/2010  . GERD 04/28/2010  . Erectile dysfunction following radical prostatectomy 04/28/2010    Past Medical History:  Diagnosis Date  . Allergic rhinitis due to pollen   . Arthritis   . GERD (gastroesophageal reflux disease)   . Hyperlipidemia   . Prostate cancer ([place])    cancer  . Right bundle branch block    RBBB    Past Surgical History:  Procedure Laterality Date  .  APPENDECTOMY    . HERNIA REPAIR  85,03   rt and lt ing hernia  . KNEE ARTHROSCOPY  2012   left  . LEFT HEART CATH AND CORONARY ANGIOGRAPHY N/A 05/30/2018   Procedure: LEFT HEART CATH AND CORONARY ANGIOGRAPHY;  Surgeon: [place], MD;  Location: [facility];  Service: Cardiovascular;  Laterality: N/A;  . prostatectomy  1998  . ROTATOR CUFF REPAIR     right  . SHOULDER ARTHROSCOPY Right 01/29/2013   Procedure: RIGHT ARTHROSCOPY SHOULDER WITH DEBRIDEMENT, POSSIBLE ROTATOR CUFF REPAIR;  Surgeon: Kerin Salen, MD;  Location: [facility];  Service: Orthopedics;  Laterality: Right;  . TONSILLECTOMY    . TOTAL HIP ARTHROPLASTY  2007   right     Social History   Socioeconomic History  . Marital status: Married    Spouse name: Not on file  . Number of children: Not on file  . Years of education: Not on file  . Highest education level: Not on file  Occupational History  . Occupation: Herbalist: RETIRED  Social Needs  . Financial resource strain: Not on file  . Food insecurity:    Worry: Not on file    Inability: Not on file  . Transportation needs:    Medical: Not on file    Non-medical: Not on file  Tobacco Use  . Smoking  status: Never Smoker  . Smokeless tobacco: Never Used  Substance and Sexual Activity  . Alcohol use: No  . Drug use: No  . Sexual activity: Not on file  Lifestyle  . Physical activity:    Days per week: Not on file    Minutes per session: Not on file  . Stress: Not on file  Relationships  . Social connections:    Talks on phone: Not on file    Gets together: Not on file    Attends religious service: Not on file    Active member of club or organization: Not on file    Attends meetings of clubs or organizations: Not on file    Relationship status: Not on file  . Intimate partner violence:    Fear of current or ex partner: Not on file    Emotionally abused: Not on file    Physically abused: Not on file     Forced sexual activity: Not on file  Other Topics Concern  . Not on file  Social History Narrative   Former competitive bodybuilder, Geophysicist/field seismologist, all natural   [employer], [place], [employer], multiple years    Family History  Problem Relation Age of Onset  . Cancer Father        prostate  . Cancer Brother        prostate  . Cancer Brother        prostate    No Known Allergies  Medication list reviewed and updated in full in [facility].  ROS: GEN: Acute illness details above GI: Tolerating PO intake GU: maintaining adequate hydration and urination Pulm: No SOB Interactive and getting along well at home.  Otherwise, ROS is as per the HPI.  Objective:   BP 100/64   Pulse 79   Temp 98 F (36.7 C) (Oral)   Ht 5' 7.5" (1.715 m)   Wt 179 lb 8 oz (81.4 kg)   BMI 27.70 kg/m   GEN: WDWN, NAD, Non-toxic, A & O x 3 HEENT: Atraumatic, Normocephalic. Neck supple. No masses, No LAD. Ears and Nose: No external deformity. CV: RRR, No M/G/R. No JVD. No thrill. No extra heart sounds. PULM: CTA B, no wheezes, crackles, rhonchi. No retractions. No resp. distress. No accessory muscle use. ABD: S, + LLQ pain, ND, +BS. No rebound. No HSM. EXTR: No c/c/e NEURO Normal gait.  PSYCH: Normally interactive. Conversant. Not depressed or anxious appearing.  Calm demeanor.     Laboratory and Imaging Data:  Assessment and Plan:   Diverticulitis large intestine w/o perforation or abscess w/o bleeding  LLQ pain - Plan: POCT Urinalysis Dipstick (Automated)  Liquids and gentle diet.  Augmentin.  Classic history and exam  Follow-up: No follow-ups on file.  Meds ordered this encounter  Medications  . amoxicillin-clavulanate (AUGMENTIN) 875-125 MG tablet    Sig: Take 1 tablet by mouth 2 (two) times daily for 10 days.    Dispense:  20 tablet    Refill:  0   Orders Placed This Encounter  Procedures  . POCT Urinalysis Dipstick (Automated)    Signed,  [place]ada T.  Cyndel Griffey, MD   Outpatient Encounter Medications as of 08/07/2018  Medication Sig  . aspirin 81 MG tablet Take 81 mg by mouth daily.    Marland Kitchen b complex vitamins tablet Take 1 tablet by mouth daily.  . cyanocobalamin 2000 MCG tablet Take 2,000 mcg by mouth daily.  Marland Kitchen omeprazole (PRILOSEC) 40 MG capsule Take 1 capsule (40 mg  total) by mouth daily.  . Red Yeast Rice 600 MG CAPS Take 1 capsule by mouth daily.    . vitamin E (VITAMIN E) 400 UNIT capsule Take 400 Units by mouth daily.  . [DISCONTINUED] IBU 800 MG tablet take 1 tablet by mouth every 6 to 8 hours if needed for pain  . [DISCONTINUED] nystatin (MYCOSTATIN) 100000 UNIT/ML suspension Take 5 mLs (500,000 Units total) by mouth 4 (four) times daily.  Marland Kitchen amoxicillin-clavulanate (AUGMENTIN) 875-125 MG tablet Take 1 tablet by mouth 2 (two) times daily for 10 days.   No facility-administered encounter medications on file as of 08/07/2018.

## 2018-08-08 ENCOUNTER — Ambulatory Visit: Payer: Medicare Other | Admitting: Family Medicine

## 2018-08-12 DIAGNOSIS — M19012 Primary osteoarthritis, left shoulder: Secondary | ICD-10-CM | POA: Diagnosis not present

## 2018-08-12 DIAGNOSIS — M19011 Primary osteoarthritis, right shoulder: Secondary | ICD-10-CM | POA: Diagnosis not present

## 2018-09-16 ENCOUNTER — Ambulatory Visit (INDEPENDENT_AMBULATORY_CARE_PROVIDER_SITE_OTHER): Payer: Medicare Other | Admitting: Internal Medicine

## 2018-09-16 ENCOUNTER — Encounter: Payer: Self-pay | Admitting: Internal Medicine

## 2018-09-16 VITALS — BP 124/82 | HR 63 | Temp 97.9°F | Wt 180.0 lb

## 2018-09-16 DIAGNOSIS — Z20828 Contact with and (suspected) exposure to other viral communicable diseases: Secondary | ICD-10-CM | POA: Diagnosis not present

## 2018-09-16 LAB — POC INFLUENZA A&B (BINAX/QUICKVUE)
Influenza A, POC: NEGATIVE
Influenza B, POC: NEGATIVE

## 2018-09-16 MED ORDER — OSELTAMIVIR PHOSPHATE 75 MG PO CAPS: 75.0000 mg | ORAL_CAPSULE | Freq: Every day | ORAL | 0 refills | Status: DC

## 2018-09-16 NOTE — Addendum Note (Signed)
Addended by: Lurlean Nanny on: 09/16/2018 04:41 PM   Modules accepted: Orders

## 2018-09-16 NOTE — Progress Notes (Signed)
Subjective:    Patient ID: Scott [name], male    DOB: 05-02-1941, 78 y.o.   MRN: [id number]  HPI  Pt presents to the clinic today requesting a flu test. He reports his grandson, who lives with him, was diagnosed with the flu on Friday. His wife started feeling sick on Saturday, but tested negative for the flu. He is currently not having any symptoms at this time, but would also like to be tested.  Review of Systems  Past Medical History:  Diagnosis Date  . Allergic rhinitis due to pollen   . Arthritis   . GERD (gastroesophageal reflux disease)   . Hyperlipidemia   . Prostate cancer ([place])    cancer  . Right bundle branch block    RBBB    Current Outpatient Medications  Medication Sig Dispense Refill  . aspirin 81 MG tablet Take 81 mg by mouth daily.      Marland Kitchen b complex vitamins tablet Take 1 tablet by mouth daily.    . cyanocobalamin 2000 MCG tablet Take 2,000 mcg by mouth daily.    Marland Kitchen omeprazole (PRILOSEC) 40 MG capsule Take 1 capsule (40 mg total) by mouth daily. 90 capsule 3  . Red Yeast Rice 600 MG CAPS Take 1 capsule by mouth daily.      . vitamin E (VITAMIN E) 400 UNIT capsule Take 400 Units by mouth daily.     No current facility-administered medications for this visit.     No Known Allergies  Family History  Problem Relation Age of Onset  . Cancer Father        prostate  . Cancer Brother        prostate  . Cancer Brother        prostate    Social History   Socioeconomic History  . Marital status: Married    Spouse name: Not on file  . Number of children: Not on file  . Years of education: Not on file  . Highest education level: Not on file  Occupational History  . Occupation: Herbalist: RETIRED  Social Needs  . Financial resource strain: Not on file  . Food insecurity:    Worry: Not on file    Inability: Not on file  . Transportation needs:    Medical: Not on file    Non-medical: Not on file  Tobacco Use  . Smoking status: Never  Smoker  . Smokeless tobacco: Never Used  Substance and Sexual Activity  . Alcohol use: No  . Drug use: No  . Sexual activity: Not on file  Lifestyle  . Physical activity:    Days per week: Not on file    Minutes per session: Not on file  . Stress: Not on file  Relationships  . Social connections:    Talks on phone: Not on file    Gets together: Not on file    Attends religious service: Not on file    Active member of club or organization: Not on file    Attends meetings of clubs or organizations: Not on file    Relationship status: Not on file  . Intimate partner violence:    Fear of current or ex partner: Not on file    Emotionally abused: Not on file    Physically abused: Not on file    Forced sexual activity: Not on file  Other Topics Concern  . Not on file  Social History Narrative   Former competitive bodybuilder,  powerlifter, all natural   [employer], [place], [employer], multiple years     Constitutional: Denies fever, malaise, fatigue, headache or abrupt weight changes.  HEENT: Denies eye pain, eye redness, ear pain, ringing in the ears, wax buildup, runny nose, nasal congestion, bloody nose, or sore throat. Respiratory: Denies difficulty breathing, shortness of breath, cough or sputum production.     No other specific complaints in a complete review of systems (except as listed in HPI above).     Objective:   Physical Exam   BP 124/82   Pulse 63   Temp 97.9 F (36.6 C) (Oral)   Wt 180 lb (81.6 kg)   SpO2 97%   BMI 27.78 kg/m   Wt Readings from Last 3 Encounters:  08/07/18 179 lb 8 oz (81.4 kg)  05/30/18 172 lb (78 kg)  09/19/17 177 lb (80.3 kg)    General: Appears his stated age, well developed, well nourished in NAD. HEENT: Head: normal shape and size, no sinus tenderness noted; ars: Tm's gray and intact, normal light reflex; Nose: mucosa pink and moist, septum midline; Throat/Mouth: Teeth present, mucosa pink and moist, no exudate,  lesions or ulcerations noted.  Neck:  No adenopathy noted. Cardiovascular: Normal rate and rhythm.  Pulmonary/Chest: Normal effort and positive vesicular breath sounds. No respiratory distress. No wheezes, rales or ronchi noted.  Neurological: Alert and oriented.    BMET    Component Value Date/Time   NA 141 05/29/2018 2322   K 3.8 05/29/2018 2322   CL 110 05/29/2018 2322   CO2 25 05/29/2018 2322   GLUCOSE 124 (H) 05/29/2018 2322   BUN 23 05/29/2018 2322   CREATININE 0.85 05/29/2018 2322   CALCIUM 8.7 (L) 05/29/2018 2322   GFRNONAA >60 05/29/2018 2322   GFRAA >60 05/29/2018 2322    Lipid Panel     Component Value Date/Time   CHOL 191 05/30/2018 0727   TRIG 101 05/30/2018 0727   HDL 31 (L) 05/30/2018 0727   CHOLHDL 6.2 05/30/2018 0727   VLDL 20 05/30/2018 0727   LDLCALC 140 (H) 05/30/2018 0727    CBC    Component Value Date/Time   WBC 4.8 05/29/2018 2322   RBC 4.49 05/29/2018 2322   HGB 13.4 05/29/2018 2322   HCT 40.0 05/29/2018 2322   PLT 189 05/29/2018 2322   MCV 89.1 05/29/2018 2322   MCH 29.8 05/29/2018 2322   MCHC 33.5 05/29/2018 2322   RDW 14.2 05/29/2018 2322   LYMPHSABS 1.1 09/10/2017 1510   MONOABS 0.5 09/10/2017 1510   EOSABS 0.2 09/10/2017 1510   BASOSABS 0.0 09/10/2017 1510    Hgb A1C Lab Results  Component Value Date   HGBA1C 6.0 (H) 05/30/2018           Assessment & Plan:   Exposure to Flu:  Rapid Flu: negative RX for Tamiflu 75 mg daily x 10 days for prophylaxis  Return precautions discussed Webb Silversmith, NP

## 2018-09-16 NOTE — Patient Instructions (Signed)
Oseltamivir capsules  What is this medicine?  OSELTAMIVIR (os el TAM i vir) is an antiviral medicine. It is used to prevent and to treat some kinds of influenza or the flu. It will not work for colds or other viral infections.  This medicine may be used for other purposes; ask your health care provider or pharmacist if you have questions.  COMMON BRAND NAME(S): Tamiflu  What should I tell my health care provider before I take this medicine?  They need to know if you have any of the following conditions:  -heart disease  -immune system problems  -kidney disease  -liver disease  -lung disease  -an unusual or allergic reaction to oseltamivir, other medicines, foods, dyes, or preservatives  -pregnant or trying to get pregnant  -breast-feeding  How should I use this medicine?  Take this medicine by mouth with a glass of water. Follow the directions on the prescription label. Start this medicine at the first sign of flu symptoms. You can take it with or without food. If it upsets your stomach, take it with food. Take your medicine at regular intervals. Do not take your medicine more often than directed. Take all of your medicine as directed even if you think you are better. Do not skip doses or stop your medicine early.  Talk to your pediatrician regarding the use of this medicine in children. While this drug may be prescribed for children as young as 14 days for selected conditions, precautions do apply.  Overdosage: If you think you have taken too much of this medicine contact a poison control center or emergency room at once.  NOTE: This medicine is only for you. Do not share this medicine with others.  What if I miss a dose?  If you miss a dose, take it as soon as you remember. If it is almost time for your next dose (within 2 hours), take only that dose. Do not take double or extra doses.  What may interact with this medicine?  -  intranasal influenza vaccine  This list may not describe all possible interactions.  Give your health care provider a list of all the medicines, herbs, non-prescription drugs, or dietary supplements you use. Also tell them if you smoke, drink alcohol, or use illegal drugs. Some items may interact with your medicine.  What should I watch for while using this medicine?  Visit your doctor or health care professional for regular check ups. Tell your doctor if your symptoms do not start to get better or if they get worse.  If you have the flu, you may be at an increased risk of developing seizures, confusion, or abnormal behavior. This occurs early in the illness, and more frequently in children and teens. These events are not common, but may result in accidental injury to the patient. Families and caregivers of patients should watch for signs of unusual behavior and contact a doctor or health care professional right away if the patient shows signs of unusual behavior.  This medicine is not a substitute for the flu shot. Talk to your doctor each year about an annual flu shot.  What side effects may I notice from receiving this medicine?  Side effects that you should report to your doctor or health care professional as soon as possible:  -allergic reactions like skin rash, itching or hives, swelling of the face, lips, or tongue  -anxiety, confusion, unusual behavior  -breathing problems  -hallucination, loss of contact with reality  -redness, blistering, peeling   or loosening of the skin, including inside the mouth  -seizures  Side effects that usually do not require medical attention (report to your doctor or health care professional if they continue or are bothersome):  -diarrhea  -headache  -nausea, vomiting  -pain  This list may not describe all possible side effects. Call your doctor for medical advice about side effects. You may report side effects to FDA at 1-800-FDA-1088.  Where should I keep my medicine?  Keep out of the reach of children.  Store at room temperature between 15 and 30 degrees C (59  and 86 degrees F). Throw away any unused medicine after the expiration date.  NOTE: This sheet is a summary. It may not cover all possible information. If you have questions about this medicine, talk to your doctor, pharmacist, or health care provider.   2019 Elsevier/Gold Standard (2017-01-17 16:45:14)

## 2018-09-18 ENCOUNTER — Other Ambulatory Visit (INDEPENDENT_AMBULATORY_CARE_PROVIDER_SITE_OTHER): Payer: Medicare Other

## 2018-09-18 DIAGNOSIS — C61 Malignant neoplasm of prostate: Secondary | ICD-10-CM | POA: Diagnosis not present

## 2018-09-18 DIAGNOSIS — Z79899 Other long term (current) drug therapy: Secondary | ICD-10-CM | POA: Diagnosis not present

## 2018-09-18 DIAGNOSIS — E785 Hyperlipidemia, unspecified: Secondary | ICD-10-CM

## 2018-09-18 DIAGNOSIS — R7303 Prediabetes: Secondary | ICD-10-CM | POA: Diagnosis not present

## 2018-09-19 ENCOUNTER — Ambulatory Visit: Payer: Medicare Other

## 2018-09-19 LAB — CBC WITH DIFFERENTIAL/PLATELET
BASOS ABS: 0 10*3/uL (ref 0.0–0.1)
Basophils Relative: 1.1 % (ref 0.0–3.0)
EOS PCT: 2.2 % (ref 0.0–5.0)
Eosinophils Absolute: 0.1 10*3/uL (ref 0.0–0.7)
HCT: 46.4 % (ref 39.0–52.0)
HEMOGLOBIN: 15.3 g/dL (ref 13.0–17.0)
LYMPHS ABS: 0.4 10*3/uL — AB (ref 0.7–4.0)
Lymphocytes Relative: 10.2 % — ABNORMAL LOW (ref 12.0–46.0)
MCHC: 33 g/dL (ref 30.0–36.0)
MCV: 88.9 fl (ref 78.0–100.0)
MONOS PCT: 16.3 % — AB (ref 3.0–12.0)
Monocytes Absolute: 0.7 10*3/uL (ref 0.1–1.0)
NEUTROS PCT: 70.2 % (ref 43.0–77.0)
Neutro Abs: 3.1 10*3/uL (ref 1.4–7.7)
Platelets: 197 10*3/uL (ref 150.0–400.0)
RBC: 5.22 Mil/uL (ref 4.22–5.81)
RDW: 14.6 % (ref 11.5–15.5)
WBC: 4.4 10*3/uL (ref 4.0–10.5)

## 2018-09-19 LAB — LIPID PANEL
CHOLESTEROL: 187 mg/dL (ref 0–200)
HDL: 31.7 mg/dL — ABNORMAL LOW (ref >39.00)
LDL Cholesterol: 131 mg/dL — ABNORMAL HIGH (ref 0–99)
NonHDL: 155.66
TRIGLYCERIDES: 123 mg/dL (ref 0.0–149.0)
Total CHOL/HDL Ratio: 6
VLDL: 24.6 mg/dL (ref 0.0–40.0)

## 2018-09-19 LAB — BASIC METABOLIC PANEL
BUN: 21 mg/dL (ref 6–23)
CALCIUM: 9.7 mg/dL (ref 8.4–10.5)
CO2: 27 mEq/L (ref 19–32)
Chloride: 103 mEq/L (ref 96–112)
Creatinine, Ser: 0.94 mg/dL (ref 0.40–1.50)
GFR: 78.13 mL/min (ref >60.00)
GLUCOSE: 99 mg/dL (ref 70–99)
Potassium: 4.7 mEq/L (ref 3.5–5.1)
Sodium: 137 mEq/L (ref 135–145)

## 2018-09-19 LAB — HEPATIC FUNCTION PANEL
ALT: 29 U/L (ref 0–53)
AST: 22 U/L (ref 0–37)
Albumin: 4.3 g/dL (ref 3.5–5.2)
Alkaline Phosphatase: 71 U/L (ref 39–117)
BILIRUBIN DIRECT: 0.1 mg/dL (ref 0.0–0.3)
BILIRUBIN TOTAL: 0.5 mg/dL (ref 0.2–1.2)
Total Protein: 6.8 g/dL (ref 6.0–8.3)

## 2018-09-19 LAB — HEMOGLOBIN A1C: HEMOGLOBIN A1C: 6.1 % (ref 4.6–6.5)

## 2018-09-19 LAB — PSA: PSA: 0.01 ng/mL — ABNORMAL LOW (ref 0.10–4.00)

## 2018-09-23 NOTE — Progress Notes (Deleted)
Dr. Frederico Hamman T. Kyna Blahnik, MD, [facility] [place], [address] Phone: [phone] Fax: [phone]  09/25/2018  Patient: Scott [name], MRN: [id number], DOB: 03-26-1941, 78 y.o.  Primary Physician:  Owens Loffler, MD   No chief complaint on file.  Subjective:   Scott [name] is a 78 y.o. pleasant patient who presents for a medicare wellness examination:  Preventative Health Maintenance Visit:  Health Maintenance Summary Reviewed and updated, unless pt declines services.  Tobacco History Reviewed. Alcohol: No concerns, no excessive use Exercise Habits: Some activity, rec at least 30 mins 5 times a week STD concerns: no risk or activity to increase risk Drug Use: None Encouraged self-testicular check  Health Maintenance  Topic Date Due  . TETANUS/TDAP  03/11/1962  . COLONOSCOPY  01/20/2019  . INFLUENZA VACCINE  Completed  . PNA vac Low Risk Adult  Completed    Immunization History  Administered Date(s) Administered  . Influenza Split 06/13/2011  . Influenza, High Dose Seasonal PF 05/09/2018  . Influenza,inj,Quad PF,6+ Mos 05/05/2015  . Influenza-Unspecified 06/01/2014, 06/21/2017  . Pneumococcal Conjugate-13 11/26/2013  . Pneumococcal Polysaccharide-23 05/05/2015    Patient Active Problem List   Diagnosis Date Noted  . Unstable angina ([place])   . Prostate cancer, in remission   . Right bundle branch block   . AV BLOCK, 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  . Hyperlipidemia LDL goal <100 04/28/2010  . ALLERGIC RHINITIS 04/28/2010  . GERD 04/28/2010  . 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 Allergic rhinitis due to pollen   . Arthritis   . GERD (gastroesophageal reflux disease)   . Hyperlipidemia   . Prostate cancer ([place])    cancer  . Right bundle branch block    RBBB   Past Surgical History:  Procedure Laterality Date  . APPENDECTOMY     . HERNIA REPAIR  85,03   rt and lt ing hernia  . KNEE ARTHROSCOPY  2012   left  . LEFT HEART CATH AND CORONARY ANGIOGRAPHY N/A 05/30/2018   Procedure: LEFT HEART CATH AND CORONARY ANGIOGRAPHY;  Surgeon: Wellington Hampshire, MD;  Location: [facility];  Service: Cardiovascular;  Laterality: N/A;  . prostatectomy  1998  . ROTATOR CUFF REPAIR     right  . SHOULDER ARTHROSCOPY Right 01/29/2013   Procedure: RIGHT ARTHROSCOPY SHOULDER WITH DEBRIDEMENT, POSSIBLE ROTATOR CUFF REPAIR;  Surgeon: Kerin Salen, MD;  Location: [facility];  Service: Orthopedics;  Laterality: Right;  . TONSILLECTOMY    . TOTAL HIP ARTHROPLASTY  2007   right    Social History   Socioeconomic History  . Marital status: Married    Spouse name: Not on file  . Number of children: Not on file  . Years of education: Not on file  . Highest education level: Not on file  Occupational History  . Occupation: Herbalist: RETIRED  Social Needs  . Financial resource strain: Not on file  . Food insecurity:    Worry: Not on file    Inability: Not on file  . Transportation needs:    Medical: Not on file    Non-medical: Not on file  Tobacco Use  . Smoking status: Never Smoker  . Smokeless tobacco: Never Used  Substance and Sexual Activity  . Alcohol use: No  . Drug use: No  . Sexual activity: Not on file  Lifestyle  .  Physical activity:    Days per week: Not on file    Minutes per session: Not on file  . Stress: Not on file  Relationships  . Social connections:    Talks on phone: Not on file    Gets together: Not on file    Attends religious service: Not on file    Active member of club or organization: Not on file    Attends meetings of clubs or organizations: Not on file    Relationship status: Not on file  . Intimate partner violence:    Fear of current or ex partner: Not on file    Emotionally abused: Not on file    Physically abused: Not on file    Forced sexual  activity: Not on file  Other Topics Concern  . Not on file  Social History Narrative   Former competitive bodybuilder, Geophysicist/field seismologist, all natural   [employer], [employer], multiple years   Family History  Problem Relation Age of Onset  . Cancer Father        prostate  . Cancer Brother        prostate  . Cancer Brother        prostate   No Known Allergies  Medication list has been reviewed and updated.   General: Denies fever, chills, sweats. No significant weight loss. Eyes: Denies blurring,significant itching ENT: Denies earache, sore throat, and hoarseness. Cardiovascular: Denies chest pains, palpitations, dyspnea on exertion Respiratory: Denies cough, dyspnea at rest,wheeezing Breast: no concerns about lumps GI: Denies nausea, vomiting, diarrhea, constipation, change in bowel habits, abdominal pain, melena, hematochezia GU: Denies penile discharge, ED, urinary flow / outflow problems. No STD concerns. Musculoskeletal: Denies back pain, joint pain Derm: Denies rash, itching Neuro: Denies  paresthesias, frequent falls, frequent headaches Psych: Denies depression, anxiety Endocrine: Denies cold intolerance, heat intolerance, polydipsia Heme: Denies enlarged lymph nodes Allergy: No hayfever  Objective:   There were no vitals taken for this visit.  The patient completed a fall screen and PHQ-2 and PHQ-9 if necessary, which is documented in the EHR. The CMA/LPN/RN who assisted the patient verbally completed with them and documented results in [facility].  No exam data present  GEN: well developed, well nourished, no acute distress Eyes: conjunctiva and lids normal, PERRLA, EOMI ENT: TM clear, nares clear, oral exam WNL Neck: supple, no lymphadenopathy, no thyromegaly, no JVD Pulm: clear to auscultation and percussion, respiratory effort normal CV: regular rate and rhythm, S1-S2, no murmur, rub or gallop, no bruits, peripheral pulses normal  and symmetric, no cyanosis, clubbing, edema or varicosities GI: soft, non-tender; no hepatosplenomegaly, masses; active bowel sounds all quadrants GU: no hernia, testicular mass, penile discharge Lymph: no cervical, axillary or inguinal adenopathy MSK: gait normal, muscle tone and strength WNL, no joint swelling, effusions, discoloration, crepitus  SKIN: clear, good turgor, color WNL, no rashes, lesions, or ulcerations Neuro: normal mental status, normal strength, sensation, and motion Psych: alert; oriented to person, place and time, normally interactive and not anxious or depressed in appearance.  All labs reviewed with patient.  Lipids: Lab Results  Component Value Date   CHOL 187 09/18/2018   Lab Results  Component Value Date   HDL 31.70 (L) 09/18/2018   Lab Results  Component Value Date   LDLCALC 131 (H) 09/18/2018   Lab Results  Component Value Date   TRIG 123.0 09/18/2018   Lab Results  Component Value Date   CHOLHDL 6 09/18/2018  CBC: CBC Latest Ref Rng & Units 09/18/2018 05/29/2018 09/10/2017  WBC 4.0 - 10.5 K/uL 4.4 4.8 4.8  Hemoglobin 13.0 - 17.0 g/dL 15.3 13.4 14.9  Hematocrit 39.0 - 52.0 % 46.4 40.0 45.1  Platelets 150.0 - 400.0 K/uL 197.0 189 505.6    Basic Metabolic Panel:    Component Value Date/Time   NA 137 09/18/2018 1612   K 4.7 09/18/2018 1612   CL 103 09/18/2018 1612   CO2 27 09/18/2018 1612   BUN 21 09/18/2018 1612   CREATININE 0.94 09/18/2018 1612   GLUCOSE 99 09/18/2018 1612   CALCIUM 9.7 09/18/2018 1612   Hepatic Function Latest Ref Rng & Units 09/18/2018 05/30/2018 09/10/2017  Total Protein 6.0 - 8.3 g/dL 6.8 6.6 6.7  Albumin 3.5 - 5.2 g/dL 4.3 3.8 4.2  AST 0 - 37 U/L 22 23 20   ALT 0 - 53 U/L 29 23 22   Alk Phosphatase 39 - 117 U/L 71 61 65  Total Bilirubin 0.2 - 1.2 mg/dL 0.5 0.8 0.3  Bilirubin, Direct 0.0 - 0.3 mg/dL 0.1 <0.1 0.1    Lab Results  Component Value Date   HGBA1C 6.1 09/18/2018   No results found for: TSH Lab  Results  Component Value Date   PSA 0.01 (L) 09/18/2018   PSA 0.01 (L) 04/28/2015   PSA 0.01 (L) 11/20/2013    Assessment and Plan:   Healthcare maintenance  Health Maintenance Exam: The patient's preventative maintenance and recommended screening tests for an annual wellness exam were reviewed in full today. Brought up to date unless services declined.  Counselled on the importance of diet, exercise, and its role in overall health and mortality. The patient's FH and SH was reviewed, including their home life, tobacco status, and drug and alcohol status.  Follow-up in 1 year for physical exam or additional follow-up below.  I have personally reviewed the Medicare Annual Wellness questionnaire and have noted 1. The patient's medical and social history 2. Their use of alcohol, tobacco or illicit drugs 3. Their current medications and supplements 4. The patient's functional ability including ADL's, fall risks, home safety risks and hearing or visual             impairment. 5. Diet and physical activities 6. Evidence for depression or mood disorders 7. Reviewed Updated provider list, see scanned forms and [facility] Snapshot.   The patients weight, height, BMI and visual acuity have been recorded in the chart I have made referrals, counseling and provided education to the patient based review of the above and I have provided the pt with a written personalized care plan for preventive services.  I have provided the patient with a copy of your personalized plan for preventive services. Instructed to take the time to review along with their updated medication list.  Follow-up: No follow-ups on file. Or follow-up in 1 year if not noted.  Signed,  Maud Deed. [facility], MD   Allergies as of 09/25/2018   No Known Allergies     Medication List       Accurate as of September 23, 2018  2:50 PM. Always use your most recent med list.        aspirin 81 MG tablet Take 81 mg by mouth daily.     b complex vitamins tablet Take 1 tablet by mouth daily.   cyanocobalamin 2000 MCG tablet Take 2,000 mcg by mouth daily.   omeprazole 40 MG capsule Commonly known as:  PRILOSEC Take 1 capsule (40 mg total) by  mouth daily.   oseltamivir 75 MG capsule Commonly known as:  TAMIFLU Take 1 capsule (75 mg total) by mouth daily.   Red Yeast Rice 600 MG Caps Take 1 capsule by mouth daily.   vitamin E 400 UNIT capsule Generic drug:  vitamin E Take 400 Units by mouth daily.

## 2018-09-25 ENCOUNTER — Ambulatory Visit: Payer: Medicare Other | Admitting: Family Medicine

## 2018-09-25 DIAGNOSIS — Z0289 Encounter for other administrative examinations: Secondary | ICD-10-CM

## 2018-10-16 NOTE — Progress Notes (Signed)
Dr. Frederico Hamman T. Osborn Pullin, MD, [facility] [place], [address] Phone: [phone] Fax: [phone]  10/17/2018  Patient: Scott [name], MRN: [id number], DOB: [birth year], 78 y.o.  Primary Physician:  Owens Loffler, MD   Chief Complaint  Patient presents with  . Annual Exam   Subjective:   Scott [name] is a 78 y.o. pleasant patient who presents for a medicare wellness examination:  Preventative Health Maintenance Visit:  Health Maintenance Summary Reviewed and updated, unless pt declines services.  Tobacco History Reviewed. Alcohol: No concerns, no excessive use Exercise Habits: Some activity, rec at least 30 mins 5 times a week STD concerns: no risk or activity to increase risk Drug Use: None Encouraged self-testicular check  Recheck BP:   Shingles: no med coverage  130/80  Wt Readings from Last 3 Encounters:  10/17/18 181 lb (82.1 kg)  09/16/18 180 lb (81.6 kg)  08/07/18 179 lb 8 oz (81.4 kg)    BP Readings from Last 3 Encounters:  10/17/18 140/78  09/16/18 124/82  08/07/18 100/64     Health Maintenance  Topic Date Due  . Samul Dada  [birth year]1962  . COLONOSCOPY  01/20/2019  . INFLUENZA VACCINE  Completed  . PNA vac Low Risk Adult  Completed    Immunization History  Administered Date(s) Administered  . Influenza Split 06/13/2011  . Influenza, High Dose Seasonal PF 05/09/2018  . Influenza,inj,Quad PF,6+ Mos 05/05/2015  . Influenza-Unspecified 06/01/2014, 06/21/2017  . Pneumococcal Conjugate-13 11/26/2013  . Pneumococcal Polysaccharide-23 05/05/2015    Patient Active Problem List   Diagnosis Date Noted  . Unstable angina ([place])   . Prostate cancer, in remission   . Right bundle branch block   . AV BLOCK, 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  . Hyperlipidemia LDL goal <100 04/28/2010  . ALLERGIC RHINITIS 04/28/2010  . GERD 04/28/2010  . Erectile dysfunction following radical prostatectomy  04/28/2010   Past Medical History:  Diagnosis Date  . Allergic rhinitis due to pollen   . Arthritis   . GERD (gastroesophageal reflux disease)   . Hyperlipidemia   . Prostate cancer ([place])    cancer  . Right bundle branch block    RBBB   Past Surgical History:  Procedure Laterality Date  . APPENDECTOMY    . HERNIA REPAIR  85,03   rt and lt ing hernia  . KNEE ARTHROSCOPY  2012   left  . LEFT HEART CATH AND CORONARY ANGIOGRAPHY N/A 05/30/2018   Procedure: LEFT HEART CATH AND CORONARY ANGIOGRAPHY;  Surgeon: Wellington Hampshire, MD;  Location: [facility];  Service: Cardiovascular;  Laterality: N/A;  . prostatectomy  1998  . ROTATOR CUFF REPAIR     right  . SHOULDER ARTHROSCOPY Right 01/29/2013   Procedure: RIGHT ARTHROSCOPY SHOULDER WITH DEBRIDEMENT, POSSIBLE ROTATOR CUFF REPAIR;  Surgeon: Kerin Salen, MD;  Location: [facility];  Service: Orthopedics;  Laterality: Right;  . TONSILLECTOMY    . TOTAL HIP ARTHROPLASTY  2007   right    Social History   Socioeconomic History  . Marital status: Married    Spouse name: Not on file  . Number of children: Not on file  . Years of education: Not on file  . Highest education level: Not on file  Occupational History  . Occupation: Herbalist: RETIRED  Social Needs  . Financial resource strain: Not on file  . Food insecurity:    Worry: Not on file  Inability: Not on file  . Transportation needs:    Medical: Not on file    Non-medical: Not on file  Tobacco Use  . Smoking status: Never Smoker  . Smokeless tobacco: Never Used  Substance and Sexual Activity  . Alcohol use: No  . Drug use: No  . Sexual activity: Not on file  Lifestyle  . Physical activity:    Days per week: Not on file    Minutes per session: Not on file  . Stress: Not on file  Relationships  . Social connections:    Talks on phone: Not on file    Gets together: Not on file    Attends religious service: Not on file     Active member of club or organization: Not on file    Attends meetings of clubs or organizations: Not on file    Relationship status: Not on file  . Intimate partner violence:    Fear of current or ex partner: Not on file    Emotionally abused: Not on file    Physically abused: Not on file    Forced sexual activity: Not on file  Other Topics Concern  . Not on file  Social History Narrative   Former competitive bodybuilder, Geophysicist/field seismologist, all [employer]   [employer], [employer], multiple years   Family History  Problem Relation Age of Onset  . Cancer Father        prostate  . Cancer Brother        prostate  . Cancer Brother        prostate   No Known Allergies  Medication list has been reviewed and updated.   General: Denies fever, chills, sweats. No significant weight loss. Eyes: Denies blurring,significant itching ENT: Denies earache, sore throat, and hoarseness. Cardiovascular: Denies chest pains, palpitations, dyspnea on exertion Respiratory: Denies cough, dyspnea at rest,wheeezing Breast: no concerns about lumps GI: Denies nausea, vomiting, diarrhea, constipation, change in bowel habits, abdominal pain, melena, hematochezia GU: Denies penile discharge, ED, urinary flow / outflow problems. No STD concerns. Musculoskeletal: Denies back pain, joint pain Derm: Denies rash, itching Neuro: Denies  paresthesias, frequent falls, frequent headaches Psych: Denies depression, anxiety Endocrine: Denies cold intolerance, heat intolerance, polydipsia Heme: Denies enlarged lymph nodes Allergy: No hayfever  Objective:   BP 140/78   Pulse 71   Temp (!) 97.5 F (36.4 C) (Oral)   Ht 5' 8.25" (1.734 m)   Wt 181 lb (82.1 kg)   SpO2 98%   BMI 27.32 kg/m   The patient completed a fall screen and PHQ-2 and PHQ-9 if necessary, which is documented in the EHR. The CMA/LPN/RN who assisted the patient verbally completed with them and documented results in [place].   Hearing Screening   Method: Audiometry   _0  _1  _2  _3  _4  _5  _6  _7  _8   Right ear:   40 40 40  0    Left ear:   40 40 40  0      GEN: well developed, well nourished, no acute distress Eyes: conjunctiva and lids normal, PERRLA, EOMI ENT: TM clear, nares clear, oral exam WNL Neck: supple, no lymphadenopathy, no thyromegaly, no JVD Pulm: clear to auscultation and percussion, respiratory effort normal CV: regular rate and rhythm, S1-S2, no murmur, rub or gallop, no bruits, peripheral pulses normal and symmetric, no cyanosis, clubbing, edema or varicosities GI: soft, non-tender; no hepatosplenomegaly, masses; active bowel sounds all quadrants GU: no hernia, testicular mass, penile discharge  Lymph: no cervical, axillary or inguinal adenopathy MSK: gait normal, muscle tone and strength WNL, no joint swelling, effusions, discoloration, crepitus  SKIN: clear, good turgor, color WNL, no rashes, lesions, or ulcerations Neuro: normal mental status, normal strength, sensation, and motion Psych: alert; oriented to person, place and time, normally interactive and not anxious or depressed in appearance.  All labs reviewed with patient.  Lipids: Lab Results  Component Value Date   CHOL 187 09/18/2018   Lab Results  Component Value Date   HDL 31.70 (L) 09/18/2018   Lab Results  Component Value Date   LDLCALC 131 (H) 09/18/2018   Lab Results  Component Value Date   TRIG 123.0 09/18/2018   Lab Results  Component Value Date   CHOLHDL 6 09/18/2018   CBC: CBC Latest Ref Rng & Units 09/18/2018 05/29/2018 09/10/2017  WBC 4.0 - 10.5 K/uL 4.4 4.8 4.8  Hemoglobin 13.0 - 17.0 g/dL 15.3 13.4 14.9  Hematocrit 39.0 - 52.0 % 46.4 40.0 45.1  Platelets 150.0 - 400.0 K/uL 197.0 189 376.2    Basic Metabolic Panel:    Component Value Date/Time   NA 137 09/18/2018 1612   K 4.7 09/18/2018 1612   CL 103 09/18/2018 1612   CO2 27 09/18/2018 1612   BUN 21  09/18/2018 1612   CREATININE 0.94 09/18/2018 1612   GLUCOSE 99 09/18/2018 1612   CALCIUM 9.7 09/18/2018 1612   Hepatic Function Latest Ref Rng & Units 09/18/2018 05/30/2018 09/10/2017  Total Protein 6.0 - 8.3 g/dL 6.8 6.6 6.7  Albumin 3.5 - 5.2 g/dL 4.3 3.8 4.2  AST 0 - 37 U/L _0 ALT 0 - 53 U/L _1 Alk Phosphatase 39 - 117 U/L 71 61 65  Total Bilirubin 0.2 - 1.2 mg/dL 0.5 0.8 0.3  Bilirubin, Direct 0.0 - 0.3 mg/dL 0.1 <0.1 0.1    Lab Results  Component Value Date   HGBA1C 6.1 09/18/2018   No results found for: TSH Lab Results  Component Value Date   PSA 0.01 (L) 09/18/2018   PSA 0.01 (L) 04/28/2015   PSA 0.01 (L) 11/20/2013    Assessment and Plan:   Healthcare maintenance   He continues to do well, working out every day, blood pressure recheck by me is normal.  Continue doing what he is doing, everything well.  Health Maintenance Exam: The patient's preventative maintenance and recommended screening tests for an annual wellness exam were reviewed in full today. Brought up to date unless services declined.  Counselled on the importance of diet, exercise, and its role in overall health and mortality. The patient's FH and SH was reviewed, including their home life, tobacco status, and drug and alcohol status.  Follow-up in 1 year for physical exam or additional follow-up below.  I have personally reviewed the Medicare Annual Wellness questionnaire and have noted 1. The patient's medical and social history 2. Their use of alcohol, tobacco or illicit drugs 3. Their current medications and supplements 4. The patient's functional ability including ADL's, fall risks, home safety risks and hearing or visual             impairment. 5. Diet and physical activities 6. Evidence for depression or mood disorders 7. Reviewed Updated provider list, see scanned forms and [facility] Snapshot.   The patients weight, height, BMI and visual acuity have been recorded in the chart I  have made referrals, counseling and provided education to the patient based review of the above and I have  provided the pt with a written personalized care plan for preventive services.  I have provided the patient with a copy of your personalized plan for preventive services. Instructed to take the time to review along with their updated medication list.  Follow-up: No follow-ups on file. Or follow-up in 1 year if not noted.  Signed,  Maud Deed. Teylor Wolven, MD   Allergies as of 10/17/2018   No Known Allergies     Medication List       Accurate as of October 17, 2018  9:41 AM. Always use your most recent med list.        aspirin 81 MG tablet Take 81 mg by mouth daily.   b complex vitamins tablet Take 1 tablet by mouth daily.   cyanocobalamin 2000 MCG tablet Take 2,000 mcg by mouth daily.   omeprazole 40 MG capsule Commonly known as:  PRILOSEC Take 1 capsule (40 mg total) by mouth daily.   oseltamivir 75 MG capsule Commonly known as:  TAMIFLU Take 1 capsule (75 mg total) by mouth daily.   Red Yeast Rice 600 MG Caps Take 1 capsule by mouth daily.   vitamin E 400 UNIT capsule Generic drug:  vitamin E Take 400 Units by mouth daily.

## 2018-10-17 ENCOUNTER — Ambulatory Visit (INDEPENDENT_AMBULATORY_CARE_PROVIDER_SITE_OTHER): Payer: Medicare Other | Admitting: Family Medicine

## 2018-10-17 ENCOUNTER — Encounter: Payer: Self-pay | Admitting: Family Medicine

## 2018-10-17 VITALS — BP 140/78 | HR 71 | Temp 97.5°F | Ht 68.25 in | Wt 181.0 lb

## 2018-10-17 DIAGNOSIS — Z0000 Encounter for general adult medical examination without abnormal findings: Secondary | ICD-10-CM

## 2018-10-17 DIAGNOSIS — K219 Gastro-esophageal reflux disease without esophagitis: Secondary | ICD-10-CM

## 2018-10-17 MED ORDER — OMEPRAZOLE 40 MG PO CPDR: 40.0000 mg | DELAYED_RELEASE_CAPSULE | Freq: Every day | ORAL | 3 refills | Status: DC

## 2018-12-24 DIAGNOSIS — M19012 Primary osteoarthritis, left shoulder: Secondary | ICD-10-CM | POA: Diagnosis not present

## 2019-04-28 IMAGING — CT CT ANGIO CHEST
2 of 6 series · 18 of 46 positions shown · IV contrast (APPLIED)
Comparison: Chest radiograph 05/29/2018

CLINICAL DATA: Intermittent chest pain since 3 p.m. on the left

EXAM:
CT ANGIOGRAPHY CHEST WITH CONTRAST
TECHNIQUE: Multidetector CT imaging of the chest was performed using the
standard protocol during bolus administration of intravenous
contrast. Multiplanar CT image reconstructions and MIPs were
obtained to evaluate the vascular anatomy.
CONTRAST:  75mL OMNIPAQUE IOHEXOL 350 MG/ML SOLN

[Series 5: thins · axial · 0.73mm/px · z∈[-645,-373]mm · 15 of 298 slices shown]
[im 13/298  lung]
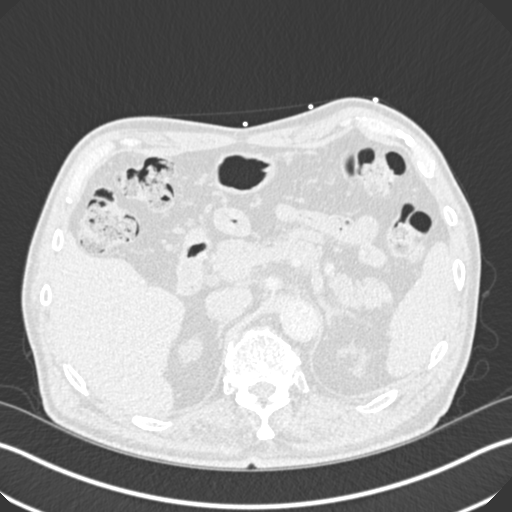
[im 39/298  soft-tissue]
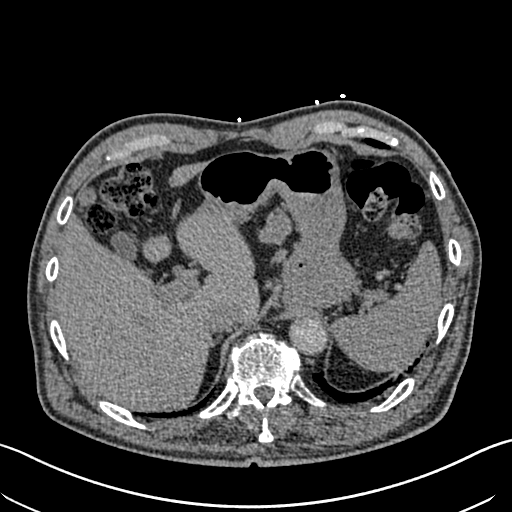
[im 52/298  lung]
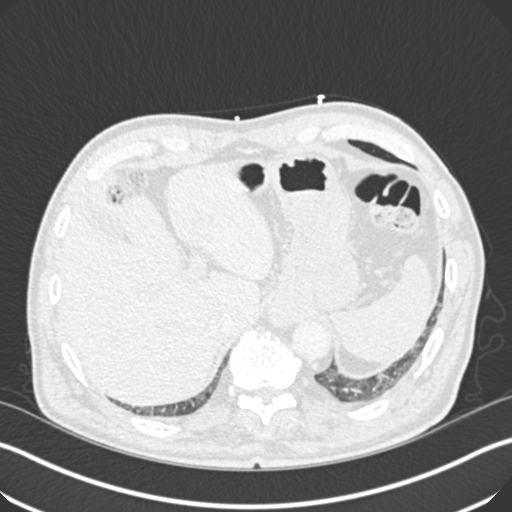
[im 78/298  soft-tissue]
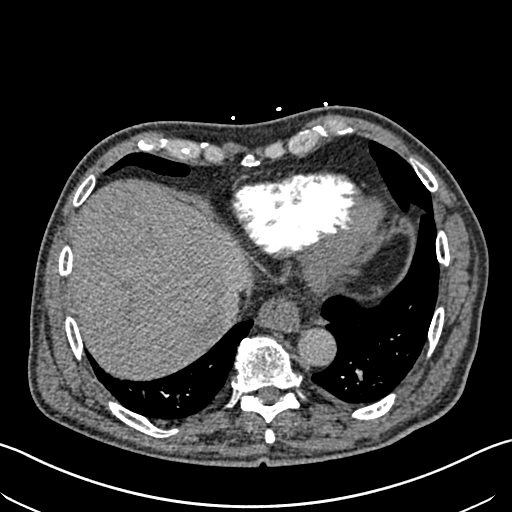
[im 91/298  lung]
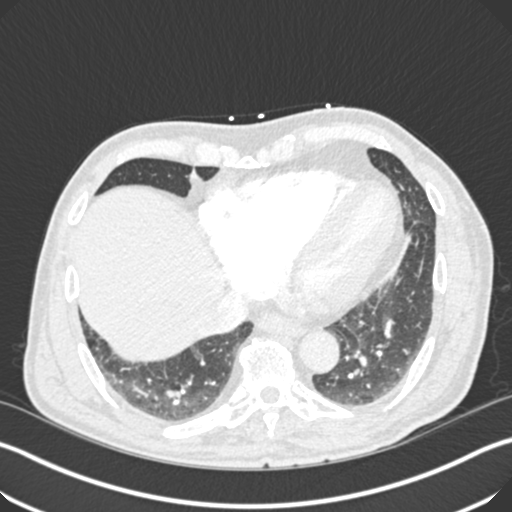
[im 117/298  soft-tissue]
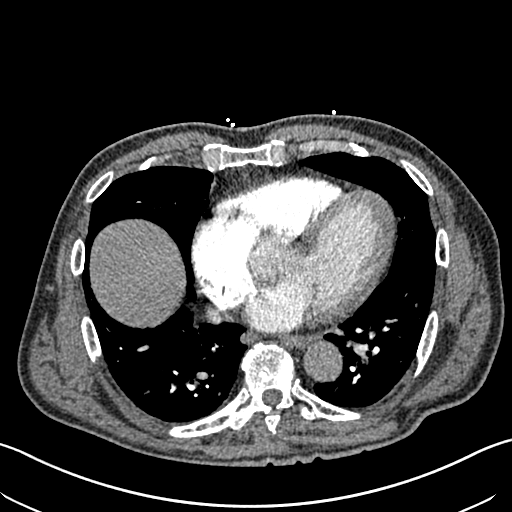
[im 130/298  lung]
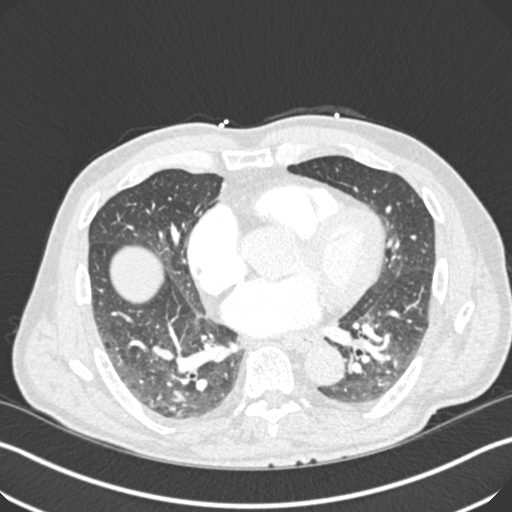
[im 155/298  soft-tissue]
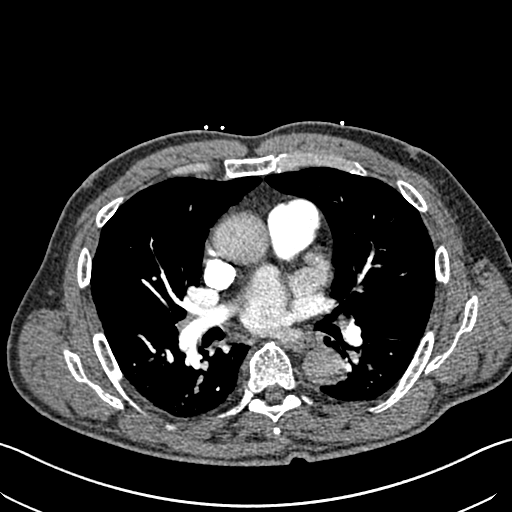
[im 168/298  lung]
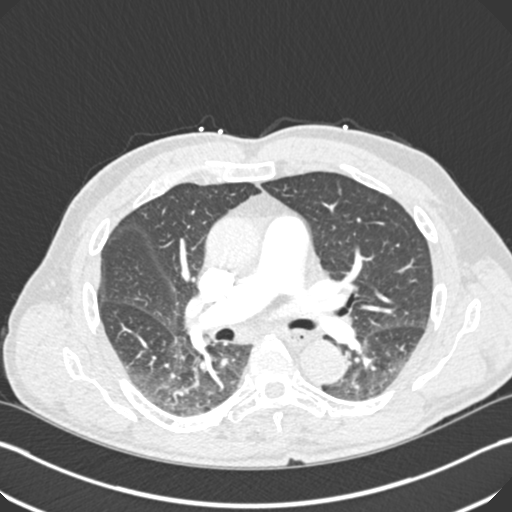
[im 181/298  soft-tissue]
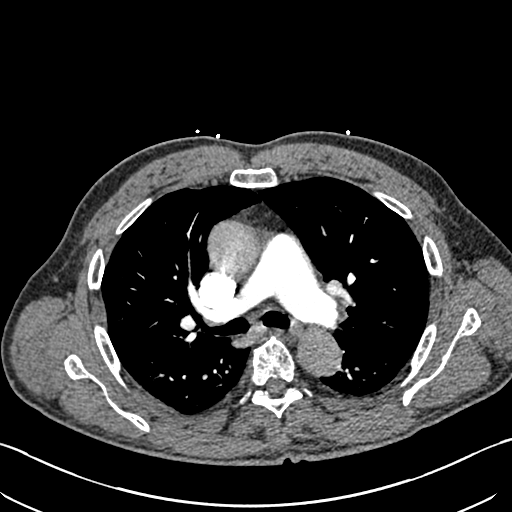
[im 207/298  lung]
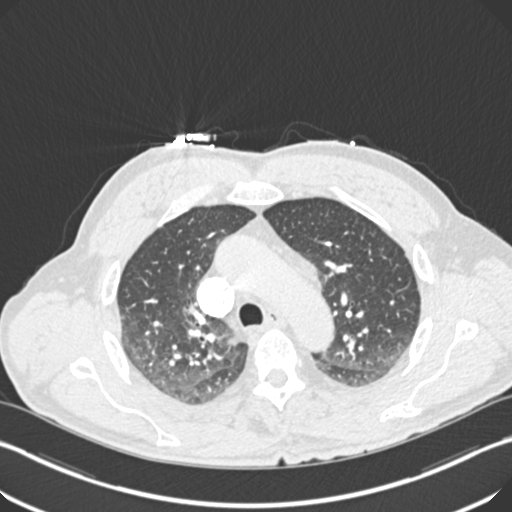
[im 220/298  soft-tissue]
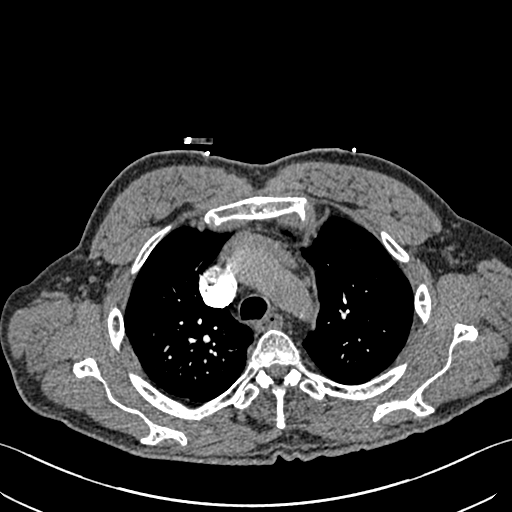
[im 246/298  lung]
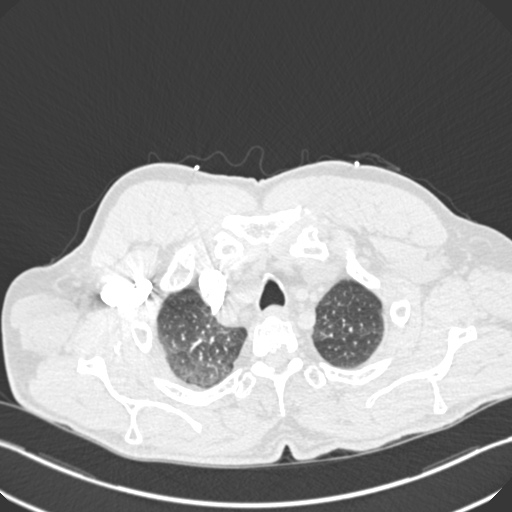
[im 259/298  soft-tissue]
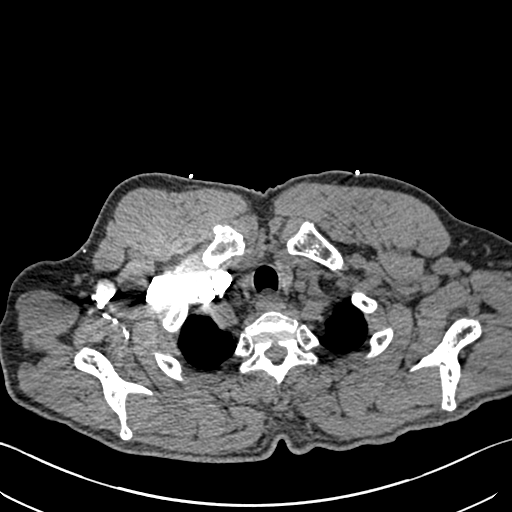
[im 285/298  lung]
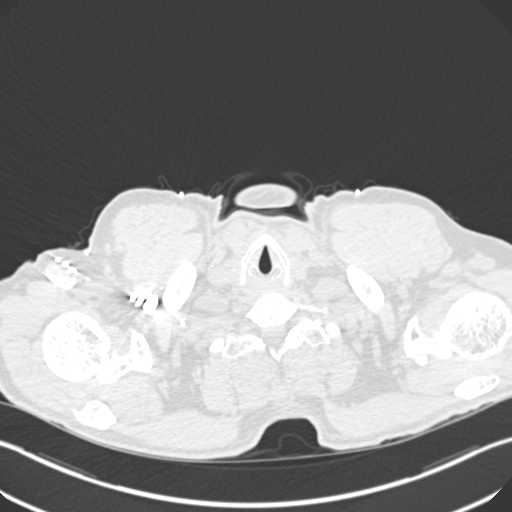

[Series 7: coronal mpr · coronal · 0.66mm/px · 3 of 81 slices shown]
[im 21/81  soft-tissue]
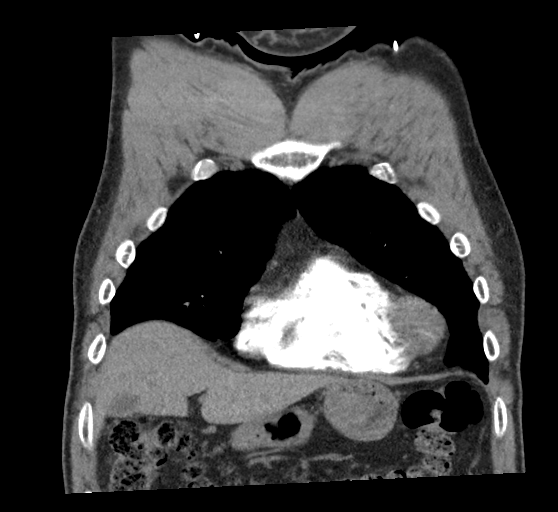
[im 41/81  soft-tissue]
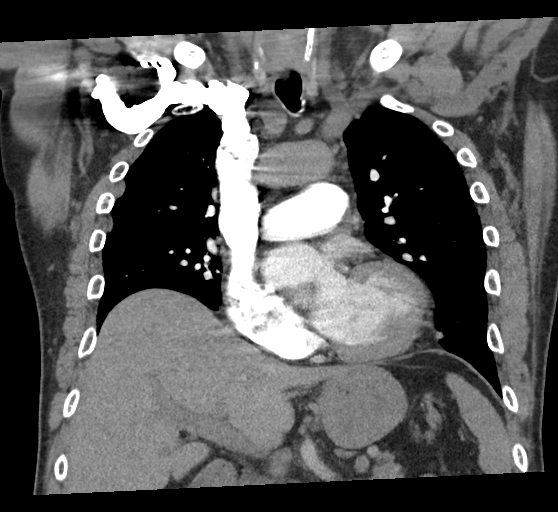
[im 61/81  soft-tissue]
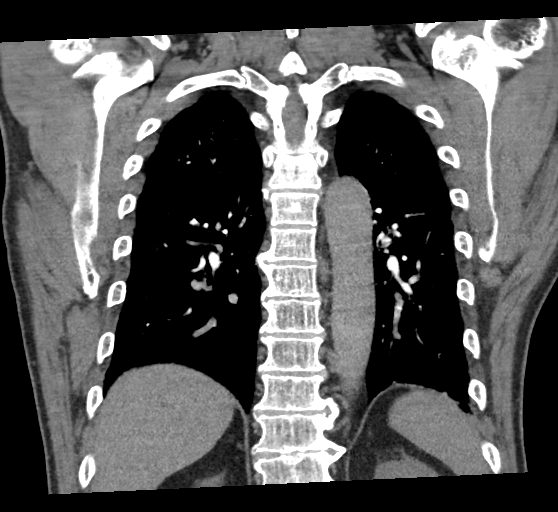

[18 of 46 positions shown; findings below may reference images not displayed]

FINDINGS: Cardiovascular: No filling defect is identified in the pulmonary
arterial tree to suggest pulmonary embolus.

Left anterior descending and circumflex coronary artery
atherosclerotic calcification with mild cardiomegaly.

Mediastinum/Nodes: Small type 1 hiatal hernia.

Lungs/Pleura: Ground-glass densities with some dependency raising
suspicion for subtle edema.

A 0.7 by 0.6 cm right upper lobe nodule is present on image [DATE].

Upper Abdomen: Unremarkable

Musculoskeletal: Degenerative glenohumeral arthropathy bilaterally.
Degenerative bilateral sternoclavicular arthropathy. Thoracic
spondylosis.

Review of the MIP images confirms the above findings.
IMPRESSION: 1. No filling defect is identified in the pulmonary arterial tree to
suggest pulmonary embolus.
2. 7 by 6 mm right upper lobe pulmonary nodule. Non-contrast chest
CT at 6-12 months is recommended. If the nodule is stable at time of
repeat CT, then future CT at 18-24 months (from today's scan) is
considered optional for low-risk patients, but is recommended for
high-risk patients. This recommendation follows the consensus
statement: Guidelines for Management of Incidental Pulmonary Nodules
Detected on CT Images: From the [HOSPITAL] 7876; Radiology
7876; [DATE].
3. Left anterior descending and circumflex coronary artery
atherosclerotic calcification.
4. Small type 1 hiatal hernia.
5. Mild cardiomegaly, with some hazy ground-glass opacities with
dependency in the right upper lobe suspicious for subtle edema.

## 2019-04-30 DIAGNOSIS — Z23 Encounter for immunization: Secondary | ICD-10-CM | POA: Diagnosis not present

## 2019-05-16 DIAGNOSIS — H04123 Dry eye syndrome of bilateral lacrimal glands: Secondary | ICD-10-CM | POA: Diagnosis not present

## 2019-05-16 DIAGNOSIS — H179 Unspecified corneal scar and opacity: Secondary | ICD-10-CM | POA: Diagnosis not present

## 2019-05-16 DIAGNOSIS — H40013 Open angle with borderline findings, low risk, bilateral: Secondary | ICD-10-CM | POA: Diagnosis not present

## 2019-05-20 DIAGNOSIS — Z20828 Contact with and (suspected) exposure to other viral communicable diseases: Secondary | ICD-10-CM | POA: Diagnosis not present

## 2019-05-20 DIAGNOSIS — Z03818 Encounter for observation for suspected exposure to other biological agents ruled out: Secondary | ICD-10-CM | POA: Diagnosis not present

## 2019-05-22 ENCOUNTER — Telehealth: Payer: Self-pay | Admitting: Family Medicine

## 2019-05-22 NOTE — Telephone Encounter (Signed)
Patient inquiring about COVID reuslts informed patient to call [facility] at [phone]

## 2019-05-26 DIAGNOSIS — L738 Other specified follicular disorders: Secondary | ICD-10-CM | POA: Diagnosis not present

## 2019-05-26 DIAGNOSIS — Z85828 Personal history of other malignant neoplasm of skin: Secondary | ICD-10-CM | POA: Diagnosis not present

## 2019-05-26 DIAGNOSIS — C44519 Basal cell carcinoma of skin of other part of trunk: Secondary | ICD-10-CM | POA: Diagnosis not present

## 2019-05-26 DIAGNOSIS — D485 Neoplasm of uncertain behavior of skin: Secondary | ICD-10-CM | POA: Diagnosis not present

## 2019-05-26 DIAGNOSIS — L820 Inflamed seborrheic keratosis: Secondary | ICD-10-CM | POA: Diagnosis not present

## 2019-06-17 DIAGNOSIS — M65311 Trigger thumb, right thumb: Secondary | ICD-10-CM | POA: Diagnosis not present

## 2019-06-17 DIAGNOSIS — M19131 Post-traumatic osteoarthritis, right wrist: Secondary | ICD-10-CM | POA: Diagnosis not present

## 2019-06-18 ENCOUNTER — Inpatient Hospital Stay (HOSPITAL_COMMUNITY)
Admit: 2019-06-18 | Discharge: 2019-06-18 | Disposition: A | Discharge status: Home / Self Care | Payer: Medicare Other | Attending: Internal Medicine | Admitting: Internal Medicine

## 2019-06-18 ENCOUNTER — Inpatient Hospital Stay: Payer: Medicare Other

## 2019-06-18 ENCOUNTER — Other Ambulatory Visit: Payer: Self-pay

## 2019-06-18 ENCOUNTER — Inpatient Hospital Stay
Admission: EM | Admit: 2019-06-18 | Discharge: 2019-06-19 | DRG: 065 | Disposition: A | Discharge status: Home / Self Care | Payer: Medicare Other | Attending: Internal Medicine | Admitting: Internal Medicine

## 2019-06-18 ENCOUNTER — Emergency Department: Payer: Medicare Other

## 2019-06-18 ENCOUNTER — Encounter: Payer: Self-pay | Admitting: Emergency Medicine

## 2019-06-18 ENCOUNTER — Inpatient Hospital Stay: Admit: 2019-06-18 | Payer: Medicare Other

## 2019-06-18 DIAGNOSIS — Z79899 Other long term (current) drug therapy: Secondary | ICD-10-CM

## 2019-06-18 DIAGNOSIS — I639 Cerebral infarction, unspecified: Secondary | ICD-10-CM

## 2019-06-18 DIAGNOSIS — I4510 Unspecified right bundle-branch block: Secondary | ICD-10-CM | POA: Diagnosis present

## 2019-06-18 DIAGNOSIS — Z96641 Presence of right artificial hip joint: Secondary | ICD-10-CM | POA: Diagnosis present

## 2019-06-18 DIAGNOSIS — J301 Allergic rhinitis due to pollen: Secondary | ICD-10-CM | POA: Diagnosis present

## 2019-06-18 DIAGNOSIS — Z9079 Acquired absence of other genital organ(s): Secondary | ICD-10-CM

## 2019-06-18 DIAGNOSIS — E785 Hyperlipidemia, unspecified: Secondary | ICD-10-CM | POA: Diagnosis present

## 2019-06-18 DIAGNOSIS — I6523 Occlusion and stenosis of bilateral carotid arteries: Secondary | ICD-10-CM | POA: Diagnosis not present

## 2019-06-18 DIAGNOSIS — I739 Peripheral vascular disease, unspecified: Secondary | ICD-10-CM | POA: Diagnosis present

## 2019-06-18 DIAGNOSIS — R531 Weakness: Secondary | ICD-10-CM | POA: Diagnosis not present

## 2019-06-18 DIAGNOSIS — R29898 Other symptoms and signs involving the musculoskeletal system: Secondary | ICD-10-CM

## 2019-06-18 DIAGNOSIS — G8194 Hemiplegia, unspecified affecting left nondominant side: Secondary | ICD-10-CM | POA: Diagnosis present

## 2019-06-18 DIAGNOSIS — D72829 Elevated white blood cell count, unspecified: Secondary | ICD-10-CM | POA: Diagnosis present

## 2019-06-18 DIAGNOSIS — R29700 NIHSS score 0: Secondary | ICD-10-CM | POA: Diagnosis present

## 2019-06-18 DIAGNOSIS — R200 Anesthesia of skin: Secondary | ICD-10-CM | POA: Diagnosis present

## 2019-06-18 DIAGNOSIS — I6389 Other cerebral infarction: Secondary | ICD-10-CM | POA: Diagnosis present

## 2019-06-18 DIAGNOSIS — R29701 NIHSS score 1: Secondary | ICD-10-CM | POA: Diagnosis present

## 2019-06-18 DIAGNOSIS — Z20828 Contact with and (suspected) exposure to other viral communicable diseases: Secondary | ICD-10-CM | POA: Diagnosis present

## 2019-06-18 DIAGNOSIS — Z8546 Personal history of malignant neoplasm of prostate: Secondary | ICD-10-CM | POA: Diagnosis not present

## 2019-06-18 DIAGNOSIS — K219 Gastro-esophageal reflux disease without esophagitis: Secondary | ICD-10-CM | POA: Diagnosis present

## 2019-06-18 DIAGNOSIS — Z7982 Long term (current) use of aspirin: Secondary | ICD-10-CM | POA: Diagnosis not present

## 2019-06-18 DIAGNOSIS — Z791 Long term (current) use of non-steroidal anti-inflammatories (NSAID): Secondary | ICD-10-CM | POA: Diagnosis not present

## 2019-06-18 DIAGNOSIS — Z8042 Family history of malignant neoplasm of prostate: Secondary | ICD-10-CM | POA: Diagnosis not present

## 2019-06-18 DIAGNOSIS — M1990 Unspecified osteoarthritis, unspecified site: Secondary | ICD-10-CM | POA: Diagnosis present

## 2019-06-18 DIAGNOSIS — R001 Bradycardia, unspecified: Secondary | ICD-10-CM | POA: Diagnosis not present

## 2019-06-18 DIAGNOSIS — M6281 Muscle weakness (generalized): Secondary | ICD-10-CM | POA: Diagnosis not present

## 2019-06-18 LAB — COMPREHENSIVE METABOLIC PANEL
ALT: 19 U/L (ref 0–44)
AST: 20 U/L (ref 15–41)
Albumin: 4.2 g/dL (ref 3.5–5.0)
Alkaline Phosphatase: 67 U/L (ref 38–126)
Anion gap: 10 (ref 5–15)
BUN: 24 mg/dL — ABNORMAL HIGH (ref 8–23)
CO2: 21 mmol/L — ABNORMAL LOW (ref 22–32)
Calcium: 9.6 mg/dL (ref 8.9–10.3)
Chloride: 109 mmol/L (ref 98–111)
Creatinine, Ser: 0.8 mg/dL (ref 0.61–1.24)
GFR calc Af Amer: >60 mL/min (ref >60)
GFR calc non Af Amer: >60 mL/min (ref >60)
Glucose, Bld: 140 mg/dL — ABNORMAL HIGH (ref 70–99)
Potassium: 4.1 mmol/L (ref 3.5–5.1)
Sodium: 140 mmol/L (ref 135–145)
Total Bilirubin: 0.6 mg/dL (ref 0.3–1.2)
Total Protein: 7 g/dL (ref 6.5–8.1)

## 2019-06-18 LAB — ECHOCARDIOGRAM COMPLETE
Height: 68.5 in
Weight: 2800 oz

## 2019-06-18 LAB — ETHANOL: Alcohol, Ethyl (B): <10 mg/dL (ref <10)

## 2019-06-18 LAB — URINE DRUG SCREEN, QUALITATIVE (ARMC ONLY)
Amphetamines, Ur Screen: NOT DETECTED
Barbiturates, Ur Screen: NOT DETECTED
Benzodiazepine, Ur Scrn: POSITIVE — AB
Cannabinoid 50 Ng, Ur ~~LOC~~: NOT DETECTED
Cocaine Metabolite,Ur ~~LOC~~: NOT DETECTED
MDMA (Ecstasy)Ur Screen: NOT DETECTED
Methadone Scn, Ur: NOT DETECTED
Opiate, Ur Screen: NOT DETECTED
Phencyclidine (PCP) Ur S: NOT DETECTED
Tricyclic, Ur Screen: NOT DETECTED

## 2019-06-18 LAB — CBC WITH DIFFERENTIAL/PLATELET
Abs Immature Granulocytes: 0.05 10*3/uL (ref 0.00–0.07)
Basophils Absolute: 0 10*3/uL (ref 0.0–0.1)
Basophils Relative: 0 %
Eosinophils Absolute: 0 10*3/uL (ref 0.0–0.5)
Eosinophils Relative: 0 %
HCT: 44.4 % (ref 39.0–52.0)
Hemoglobin: 14.9 g/dL (ref 13.0–17.0)
Immature Granulocytes: 0 %
Lymphocytes Relative: 6 %
Lymphs Abs: 0.7 10*3/uL (ref 0.7–4.0)
MCH: 29.5 pg (ref 26.0–34.0)
MCHC: 33.6 g/dL (ref 30.0–36.0)
MCV: 87.9 fL (ref 80.0–100.0)
Monocytes Absolute: 0.5 10*3/uL (ref 0.1–1.0)
Monocytes Relative: 4 %
Neutro Abs: 11 10*3/uL — ABNORMAL HIGH (ref 1.7–7.7)
Neutrophils Relative %: 90 %
Platelets: 195 10*3/uL (ref 150–400)
RBC: 5.05 MIL/uL (ref 4.22–5.81)
RDW: 14 % (ref 11.5–15.5)
WBC: 12.2 10*3/uL — ABNORMAL HIGH (ref 4.0–10.5)
nRBC: 0 % (ref 0.0–0.2)

## 2019-06-18 LAB — PROTIME-INR
INR: 1.1 (ref 0.8–1.2)
Prothrombin Time: 14.1 seconds (ref 11.4–15.2)

## 2019-06-18 LAB — LIPID PANEL
Cholesterol: 169 mg/dL (ref 0–200)
HDL: 37 mg/dL — ABNORMAL LOW (ref >40)
LDL Cholesterol: 120 mg/dL — ABNORMAL HIGH (ref 0–99)
Total CHOL/HDL Ratio: 4.6 RATIO
Triglycerides: 58 mg/dL (ref <150)
VLDL: 12 mg/dL (ref 0–40)

## 2019-06-18 LAB — SARS CORONAVIRUS 2 BY RT PCR (HOSPITAL ORDER, PERFORMED IN ~~LOC~~ HOSPITAL LAB): SARS Coronavirus 2: NEGATIVE

## 2019-06-18 LAB — APTT: aPTT: 29 seconds (ref 24–36)

## 2019-06-18 LAB — HEMOGLOBIN A1C
Hgb A1c MFr Bld: 6 % — ABNORMAL HIGH (ref 4.8–5.6)
Mean Plasma Glucose: 125.5 mg/dL

## 2019-06-18 MED ORDER — ASPIRIN 300 MG RE SUPP: 300.0000 mg | Freq: Every day | RECTAL | Status: DC

## 2019-06-18 MED ORDER — ENOXAPARIN SODIUM 40 MG/0.4ML ~~LOC~~ SOLN
40.0000 mg | SUBCUTANEOUS | Status: DC
  Administered 2019-06-18 – 2019-06-19 (×2): 40 mg via SUBCUTANEOUS
  Filled 2019-06-18 (×2): qty 0.4

## 2019-06-18 MED ORDER — ASPIRIN 81 MG PO CHEW
324.0000 mg | CHEWABLE_TABLET | Freq: Once | ORAL | Status: AC
  Administered 2019-06-18: 324 mg via ORAL
  Filled 2019-06-18: qty 4

## 2019-06-18 MED ORDER — LORAZEPAM 2 MG/ML IJ SOLN
2.0000 mg | Freq: Once | INTRAMUSCULAR | Status: AC
  Administered 2019-06-18: 2 mg via INTRAVENOUS
  Filled 2019-06-18: qty 1

## 2019-06-18 MED ORDER — STROKE: EARLY STAGES OF RECOVERY BOOK
Freq: Once | Status: AC
  Administered 2019-06-18: 23:00:00

## 2019-06-18 MED ORDER — SENNOSIDES-DOCUSATE SODIUM 8.6-50 MG PO TABS: 1.0000 | ORAL_TABLET | Freq: Every evening | ORAL | Status: DC | PRN

## 2019-06-18 MED ORDER — ACETAMINOPHEN 160 MG/5ML PO SOLN
650.0000 mg | ORAL | Status: DC | PRN
  Filled 2019-06-18: qty 20.3

## 2019-06-18 MED ORDER — ATORVASTATIN CALCIUM 20 MG PO TABS: 40.0000 mg | ORAL_TABLET | Freq: Every day | ORAL | Status: DC

## 2019-06-18 MED ORDER — ACETAMINOPHEN 325 MG PO TABS
650.0000 mg | ORAL_TABLET | ORAL | Status: DC | PRN
  Administered 2019-06-18: 650 mg via ORAL

## 2019-06-18 MED ORDER — TRAZODONE HCL 50 MG PO TABS
50.0000 mg | ORAL_TABLET | Freq: Every evening | ORAL | Status: DC | PRN
  Administered 2019-06-18: 50 mg via ORAL
  Filled 2019-06-18: qty 1

## 2019-06-18 MED ORDER — SODIUM CHLORIDE 0.9 % IV SOLN
Freq: Once | INTRAVENOUS | Status: AC
  Administered 2019-06-18: 10:00:00 via INTRAVENOUS

## 2019-06-18 MED ORDER — ACETAMINOPHEN 650 MG RE SUPP: 650.0000 mg | RECTAL | Status: DC | PRN

## 2019-06-18 MED ORDER — ASPIRIN 325 MG PO TABS
325.0000 mg | ORAL_TABLET | Freq: Every day | ORAL | Status: DC
  Administered 2019-06-19: 10:00:00 325 mg via ORAL
  Filled 2019-06-18 (×2): qty 1

## 2019-06-18 MED ORDER — GUAIFENESIN-DM 100-10 MG/5ML PO SYRP: 5.0000 mL | ORAL_SOLUTION | Freq: Four times a day (QID) | ORAL | Status: DC | PRN

## 2019-06-18 NOTE — ED Notes (Signed)
Pt switched from ed stretcher to hospital bed at this time

## 2019-06-18 NOTE — ED Notes (Signed)
ED Provider at bedside. 

## 2019-06-18 NOTE — Progress Notes (Signed)
*  PRELIMINARY RESULTS* Echocardiogram 2D Echocardiogram has been performed.  Sherrie Sport 06/18/2019, 2:04 PM

## 2019-06-18 NOTE — ED Notes (Signed)
Pt grandson present and pt asked to sit on side of bed to eat lunch tray. Pt repositioned to sit on sit of bed and instructed not to stand up without assistance. Pt to notify RN when he is finished and needs to be repositioned in bed again.

## 2019-06-18 NOTE — ED Notes (Signed)
MD notified of pt passing swallow screen

## 2019-06-18 NOTE — Progress Notes (Signed)
  Patient noncompliant with safety measures. Refuses bed alarm. Patient educated on importance of safety and unsteady gait. Patient advised to call for assistance, patient refused and continues to ambulate in room unassisted. MD notified. Will continue to monitor.

## 2019-06-18 NOTE — ED Provider Notes (Signed)
[facility] Emergency Department Provider Note  ____________________________________________   First MD Initiated Contact with Patient 06/18/19 [id number]     (approximate)  I have reviewed the triage vital signs and the nursing notes.   HISTORY  Chief Complaint Weakness    HPI Scott [name] is a 78 y.o. male who is generally healthy and well and active in spite of his age and works out regularly, no history of stroke, no anticoagulation other than a daily baby aspirin.  He presents this morning by private vehicle for evaluation of acute onset left leg weakness that has now spread to his left arm.  He states that he first noticed the symptoms at about 7 PM, nearly 12 hours prior to his arrival in the emergency department.  He noticed it with some weakness in the left leg and then he started to feel like he was dragging his left leg when walking.  By the time he went to bed the weakness was at least moderate in severity but he took a baby aspirin and was hoping he would be better when he woke up.  Unfortunately when he woke up this morning the symptoms were still present and now he feels some left arm weakness as well.  He also reports that he feels like he is having some trouble thinking or with his memory and sometimes he has trouble expressing his words.  He denies headache, visual changes, sore throat, chest pain, shortness of breath, nausea, vomiting, abdominal pain, and dysuria.  He has not been around anyone with COVID-19.   He says that he works out every day and is worked out regularly and try to eat right for the last 50 years.        Past Medical History:  Diagnosis Date  . Allergic rhinitis due to pollen   . Arthritis   . GERD (gastroesophageal reflux disease)   . Hyperlipidemia   . Prostate cancer ([place])    cancer  . Right bundle branch block    RBBB    Patient Active Problem List   Diagnosis Date Noted  . Unstable angina ([place])   . Prostate  cancer, in remission   . Right bundle branch block   . AV BLOCK, 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  . Hyperlipidemia LDL goal <100 04/28/2010  . ALLERGIC RHINITIS 04/28/2010  . GERD 04/28/2010  . Erectile dysfunction following radical prostatectomy 04/28/2010    Past Surgical History:  Procedure Laterality Date  . APPENDECTOMY    . HERNIA REPAIR  85,03   rt and lt ing hernia  . KNEE ARTHROSCOPY  2012   left  . LEFT HEART CATH AND CORONARY ANGIOGRAPHY N/A 05/30/2018   Procedure: LEFT HEART CATH AND CORONARY ANGIOGRAPHY;  Surgeon: Wellington Hampshire, MD;  Location: [place] [facility];  Service: Cardiovascular;  Laterality: N/A;  . prostatectomy  1998  . ROTATOR CUFF REPAIR     right  . SHOULDER ARTHROSCOPY Right 01/29/2013   Procedure: RIGHT ARTHROSCOPY SHOULDER WITH DEBRIDEMENT, POSSIBLE ROTATOR CUFF REPAIR;  Surgeon: Kerin Salen, MD;  Location: [place];  Service: Orthopedics;  Laterality: Right;  . TONSILLECTOMY    . TOTAL HIP ARTHROPLASTY  2007   right     Prior to Admission medications   Medication Sig Start Date End Date Taking? Authorizing Provider  aspirin 81 MG tablet Take 81 mg by mouth daily.      [provider]  b complex vitamins tablet Take 1 tablet  by mouth daily.    [provider]  cyanocobalamin 2000 MCG tablet Take 2,000 mcg by mouth daily.    [provider]  omeprazole (PRILOSEC) 40 MG capsule Take 1 capsule (40 mg total) by mouth daily. 10/17/18   Copland, Frederico Hamman, MD  Red Yeast Rice 600 MG CAPS Take 1 capsule by mouth daily.      [provider]  vitamin E (VITAMIN E) 400 UNIT capsule Take 400 Units by mouth daily.    [provider]    Allergies Patient has no known allergies.  Family History  Problem Relation Age of Onset  . Cancer Father        prostate  . Cancer Brother        prostate  . Cancer Brother        prostate    Social History Social History   Tobacco Use  . Smoking status:  Never Smoker  . Smokeless tobacco: Never Used  Substance Use Topics  . Alcohol use: No  . Drug use: No    Review of Systems Constitutional: No fever/chills Eyes: No visual changes. ENT: No sore throat. Cardiovascular: Denies chest pain. Respiratory: Denies shortness of breath. Gastrointestinal: No abdominal pain.  No nausea, no vomiting.  No diarrhea.  No constipation. Genitourinary: Negative for dysuria. Musculoskeletal: Negative for neck pain.  Negative for back pain. Integumentary: Negative for rash. Neurological: 12-hour ago onset of left leg weakness, now with some other neurological symptoms as described above.   ____________________________________________   PHYSICAL EXAM:  VITAL SIGNS: ED Triage Vitals  Enc Vitals Group     BP 06/18/19 0616 136/82     Pulse Rate 06/18/19 0616 100     Resp 06/18/19 0616 16     Temp 06/18/19 0616 97.6 F (36.4 C)     Temp Source 06/18/19 0616 Oral     SpO2 06/18/19 0616 95 %     Weight 06/18/19 0623 79.4 kg (175 lb)     Height 06/18/19 0623 1.74 m (5' 8.5")     Head Circumference --      Peak Flow --      Pain Score 06/18/19 0619 0     Pain Loc --      Pain Edu? --      Excl. in [place]? --     Constitutional: Alert and oriented.  No acute distress.  Appears quite healthy and physically fit in spite of his age. Eyes: Conjunctivae are normal.  Head: Atraumatic. Nose: No congestion/rhinnorhea. Mouth/Throat: Patient is wearing a mask. Neck: No stridor.  No meningeal signs.   Cardiovascular: Normal rate, regular rhythm. Good peripheral circulation. Grossly normal heart sounds. Respiratory: Normal respiratory effort.  No retractions. Gastrointestinal: Soft and nontender. No distention.  Musculoskeletal: No lower extremity tenderness nor edema. No gross deformities of extremities. Skin:  Skin is warm, dry and intact. Psychiatric: Mood and affect are normal. Speech and behavior are normal. Neurologic: The patient is awake and alert  with a GCS of 15.  He has slightly decreased grip strength and general muscle strength in the left upper extremity but he reports that he is right-handed.  The only clearly observable neurological deficits are and his overall left leg strength where he is able to resist against gravity briefly but his leg falls back to the bed.  He has no subjective sensory deficits.  He is speaking clearly and has no appreciable aphasia nor dysarthria.  He has no obvious cranial nerve deficits.  See  below for NIH stroke scale details.  NIH Stroke Scale Interval: Baseline Time: 6:15 AM Person Administering Scale: Hinda Kehr  Administer stroke scale items in the order listed. Record performance in each category after each subscale exam. Do not go back and change scores. Follow directions provided for each exam technique. Scores should reflect what the patient does, not what the clinician thinks the patient can do. The clinician should record answers while administering the exam and work quickly. Except where indicated, the patient should not be coached (i.e., repeated requests to patient to make a special effort).   1a  Level of consciousness: 0=alert; keenly responsive  1b. LOC questions:  0=Performs both tasks correctly  1c. LOC commands: 0=Performs both tasks correctly  2.  Best Gaze: 0=normal  3.  Visual: 0=No visual loss  4. Facial Palsy: 0=Normal symmetric movement  5a.  Motor left arm: 0=No drift, limb holds 90 (or 45) degrees for full 10 seconds  5b.  Motor right arm: 0=No drift, limb holds 90 (or 45) degrees for full 10 seconds  6a. motor left leg: 1=Drift, limb holds 90 (or 45) degrees but drifts down before full 10 seconds: does not hit bed  6b  Motor right leg:  0=No drift, limb holds 90 (or 45) degrees for full 10 seconds  7. Limb Ataxia: 0=Absent  8.  Sensory: 0=Normal; no sensory loss  9. Best Language:  0=No aphasia, normal  10. Dysarthria: 0=Normal  11. Extinction and Inattention: 0=No  abnormality  12. Distal motor function: 0=Normal   Total:   1    ____________________________________________   LABS (all labs ordered are listed, but only abnormal results are displayed)  Labs Reviewed  COMPREHENSIVE METABOLIC PANEL - Abnormal; Notable for the following components:      Result Value   CO2 21 (*)    Glucose, Bld 140 (*)    BUN 24 (*)    All other components within normal limits  CBC WITH DIFFERENTIAL/PLATELET - Abnormal; Notable for the following components:   WBC 12.2 (*)    Neutro Abs 11.0 (*)    All other components within normal limits  SARS CORONAVIRUS 2 BY RT PCR (HOSPITAL ORDER, [place] [facility])  ETHANOL  URINE DRUG SCREEN, QUALITATIVE ([facility] ONLY)  URINALYSIS, ROUTINE W REFLEX MICROSCOPIC  PROTIME-INR  APTT  CBG MONITORING, ED   ____________________________________________  EKG  ED ECG REPORT I, Hinda Kehr, the attending physician, personally viewed and interpreted this ECG.  Date: 06/18/2019 EKG Time: 6:21 AM Rate: 92 Rhythm: sinus rhythm with first-degree AV block QRS Axis: normal Intervals: Right bundle branch block.  QTc is 495 ms, PR interval is 228 ms ST/T Wave abnormalities: No ischemic changes in the T waves nor ST segments. Narrative Interpretation: no definitive evidence of acute ischemia; does not meet STEMI criteria.   ____________________________________________  RADIOLOGY I, Hinda Kehr, personally viewed and evaluated these images (plain radiographs) as part of my medical decision making, as well as reviewing the written report by the radiologist.  ED MD interpretation:  No acute abnormalities  Official radiology report(s): Ct Head Wo Contrast  Result Date: 06/18/2019 CLINICAL DATA:  Weakness with left foot dragging EXAM: CT HEAD WITHOUT CONTRAST TECHNIQUE: Contiguous axial images were obtained from the base of the skull through the vertex without intravenous contrast. COMPARISON:  None. FINDINGS:  Brain: No evidence of acute infarction, hemorrhage, hydrocephalus, extra-axial collection or mass lesion/mass effect. Vascular: No hyperdense vessel or unexpected calcification. Skull: Normal. Negative for  fracture or focal lesion. Sinuses/Orbits: No acute finding. IMPRESSION: Negative head CT. Electronically Signed   By: Monte Fantasia M.D.   On: 06/18/2019 07:15    ____________________________________________   PROCEDURES   Procedure(s) performed (including Critical Care):  .Critical Care Performed by: Hinda Kehr, MD Authorized by: Hinda Kehr, MD   Critical care provider statement:    Critical care time (minutes):  30   Critical care time was exclusive of:  Separately billable procedures and treating other patients   Critical care was necessary to treat or prevent imminent or life-threatening deterioration of the following conditions:  CNS failure or compromise   Critical care was time spent personally by me on the following activities:  Development of treatment plan with patient or surrogate, discussions with consultants, evaluation of patient's response to treatment, examination of patient, obtaining history from patient or surrogate, ordering and performing treatments and interventions, ordering and review of laboratory studies, ordering and review of radiographic studies, pulse oximetry, re-evaluation of patient's condition and review of old charts     ____________________________________________   [place] / MDM / [place] / ED COURSE  As part of my medical decision making, I reviewed the following data within the electronic MEDICAL RECORD NUMBER History obtained from family, Labs reviewed , EKG interpreted , Old chart reviewed, Discussed with admitting physician  and Notes from prior ED visits   Differential diagnosis includes, but is not limited to, CVA, TIA, intracranial hemorrhage, electrolyte or metabolic abnormality, acute infection such as  meningitis or encephalitis.  In spite of the patient's age she is quite healthy and active and tries to lead a healthy lifestyle.  However he does have a history of some coronary artery disease and he has signs and symptoms that are most consistent with acute CVA.  However the onset of his symptoms was about 12 hours ago and they are relatively mild at this time with an NIH stroke scale of 1.  I do not believe he would benefit from initiating code stroke but I am going to emergently get a head CT and do the normal stroke work-up.  It is almost morning and I think he would benefit more from seeing an in person neurologist rather than consulting teleneurology given that he is not a candidate for TPA.  I have explained this to the patient and his family and they are in agreement.  He is hemodynamically stable.  I have also ordered a full dose aspirin for after he passes the ED nurse stroke swallow screen.       Clinical Course as of Jun 17 753  Wed Jun 18, 2019  [id number] No acute abnormalities identified on head CT.  I reassessed the patient and he still has substantial neurological deficits in the left leg and is unable to ambulate or keep the leg elevated against gravity.  It is appropriate to admit him at this time to the hospitalist service for stroke work-up.  I have paged the hospitalist service and updated the family about the plan.  They are in agreement.  CT HEAD WO CONTRAST [CF]  [id number] Discussed with Dr. Fritzi Mandes with the hospitalist service who will admit.   [CF]    Clinical Course User Index [CF] Hinda Kehr, MD     ____________________________________________  FINAL CLINICAL IMPRESSION(S) / ED DIAGNOSES  Final diagnoses:  Cerebrovascular accident (CVA), unspecified mechanism ([place])  Left leg weakness     MEDICATIONS GIVEN DURING THIS VISIT:  Medications  aspirin chewable  tablet 324 mg (324 mg Oral Given 06/18/19 [id number])     ED Discharge Orders    None      *Please note:   RODRIQUES THEROUX was evaluated in Emergency Department on 06/18/2019 for the symptoms described in the history of present illness. He was evaluated in the context of the global COVID-19 pandemic, which necessitated consideration that the patient might be at risk for infection with the SARS-CoV-2 virus that causes COVID-19. Institutional protocols and algorithms that pertain to the evaluation of patients at risk for COVID-19 are in a state of rapid change based on information released by regulatory bodies including the CDC and federal and state organizations. These policies and algorithms were followed during the patient's care in the ED.  Some ED evaluations and interventions may be delayed as a result of limited staffing during the pandemic.*  Note:  This document was prepared using Dragon voice recognition software and may include unintentional dictation errors.   Hinda Kehr, MD 06/18/19 [phone]

## 2019-06-18 NOTE — ED Triage Notes (Signed)
Pt arrived via POV with reports of feeling like his left foot was dragging around the time he was going to bed around 10pm last night.   Pt reports weakness was noticeable when he would try to lift his leg up and it would just fall back down.  Pt also reports having some memory trouble.  First RN states pt seemed to have some difficulty with getting out his words and hearing gurgling noises as well.

## 2019-06-18 NOTE — Consult Note (Signed)
Referring Physician: Posey Pronto    Chief Complaint: Left sided weakness  HPI: Scott [name] is an 78 y.o. male with a history of HLD who reports that on yesterday he noted that he was dragging his left leg.  Did not seek medical attention at that time.  On awakening today noted that his left arm was weak as well and presented for evaluation.  Initial NIHSS of 0.  Date last known well: Date: 06/17/2019 Time last known well: Time: 19:00 tPA Given: No: Patient outside time window  Past Medical History:  Diagnosis Date  . Allergic rhinitis due to pollen   . Arthritis   . GERD (gastroesophageal reflux disease)   . Hyperlipidemia   . Prostate cancer ([place])    cancer  . Right bundle branch block    RBBB    Past Surgical History:  Procedure Laterality Date  . APPENDECTOMY    . HERNIA REPAIR  85,03   rt and lt ing hernia  . KNEE ARTHROSCOPY  2012   left  . LEFT HEART CATH AND CORONARY ANGIOGRAPHY N/A 05/30/2018   Procedure: LEFT HEART CATH AND CORONARY ANGIOGRAPHY;  Surgeon: Wellington Hampshire, MD;  Location: [facility];  Service: Cardiovascular;  Laterality: N/A;  . prostatectomy  1998  . ROTATOR CUFF REPAIR     right  . SHOULDER ARTHROSCOPY Right 01/29/2013   Procedure: RIGHT ARTHROSCOPY SHOULDER WITH DEBRIDEMENT, POSSIBLE ROTATOR CUFF REPAIR;  Surgeon: Kerin Salen, MD;  Location: [facility];  Service: Orthopedics;  Laterality: Right;  . TONSILLECTOMY    . TOTAL HIP ARTHROPLASTY  2007   right     Family History  Problem Relation Age of Onset  . Cancer Father        prostate  . Cancer Brother        prostate  . Cancer Brother        prostate   Social History:  reports that he has never smoked. He has never used smokeless tobacco. He reports that he does not drink alcohol or use drugs.  Allergies: No Known Allergies  Medications:  I have reviewed the patient's current medications. Prior to Admission:  Prior to Admission medications   Medication  Sig Start Date End Date Taking? Authorizing Provider  aspirin 81 MG tablet Take 81 mg by mouth daily.     Yes [provider]  cyanocobalamin 2000 MCG tablet Take 2,000 mcg by mouth daily.   Yes [provider]  MOBIC 15 MG tablet Take 15 mg by mouth daily as needed. 06/17/19  Yes [provider]  omeprazole (PRILOSEC) 40 MG capsule Take 1 capsule (40 mg total) by mouth daily. 10/17/18  Yes Copland, Frederico Hamman, MD  Red Yeast Rice 600 MG CAPS Take 2,400 capsules by mouth daily.    Yes [provider]  vitamin E (VITAMIN E) 400 UNIT capsule Take 400 Units by mouth daily.   Yes [provider]     Scheduled: .  stroke: mapping our early stages of recovery book   Does not apply Once  . aspirin  300 mg Rectal Daily   Or  . aspirin  325 mg Oral Daily  . atorvastatin  40 mg Oral q1800  . enoxaparin (LOVENOX) injection  40 mg Subcutaneous Q24H    ROS: History obtained from the patient  General ROS: negative for - chills, fatigue, fever, night sweats, weight gain or weight loss Psychological ROS: negative for - behavioral disorder, hallucinations, memory difficulties,  mood swings or suicidal ideation Ophthalmic ROS: negative for - blurry vision, double vision, eye pain or loss of vision ENT ROS: negative for - epistaxis, nasal discharge, oral lesions, sore throat, tinnitus or vertigo Allergy and Immunology ROS: negative for - hives or itchy/watery eyes Hematological and Lymphatic ROS: negative for - bleeding problems, bruising or swollen lymph nodes Endocrine ROS: negative for - galactorrhea, hair pattern changes, polydipsia/polyuria or temperature intolerance Respiratory ROS: negative for - cough, hemoptysis, shortness of breath or wheezing Cardiovascular ROS: negative for - chest pain, dyspnea on exertion, edema or irregular heartbeat Gastrointestinal ROS: negative for - abdominal pain, diarrhea, hematemesis, nausea/vomiting or stool  incontinence Genito-Urinary ROS: negative for - dysuria, hematuria, incontinence or urinary frequency/urgency Musculoskeletal ROS: negative for - joint swelling or muscular weakness Neurological ROS: as noted in HPI Dermatological ROS: negative for rash and skin lesion changes  Physical Examination: Blood pressure 125/84, pulse 68, temperature 97.6 F (36.4 C), temperature source Oral, resp. rate 18, height 5' 8.5" (1.74 m), weight 79.4 kg, SpO2 98 %.  HEENT-  Normocephalic, no lesions, without obvious abnormality.  Normal external eye and conjunctiva.  Normal TM's bilaterally.  Normal auditory canals and external ears. Normal external nose, mucus membranes and septum.  Normal pharynx. Cardiovascular- S1, S2 normal, pulses palpable throughout   Lungs- chest clear, no wheezing, rales, normal symmetric air entry Abdomen- soft, non-tender; bowel sounds normal; no masses,  no organomegaly Extremities- no edema Lymph-no adenopathy palpable Musculoskeletal-no joint tenderness, deformity or swelling Skin-warm and dry, no hyperpigmentation, vitiligo, or suspicious lesions  Neurological Examination   Mental Status: Alert, oriented, thought content appropriate.  Speech fluent without evidence of aphasia.  Able to follow 3 step commands without difficulty. Cranial Nerves: II: Discs flat bilaterally; Visual fields grossly normal, pupils equal, round, reactive to light and accommodation III,IV, VI: ptosis not present, extra-ocular motions intact bilaterally V,VII: smile symmetric, facial light touch sensation normal bilaterally VIII: hearing normal bilaterally IX,X: gag reflex present XI: bilateral shoulder shrug XII: midline tongue extension Motor: Right : Upper extremity   5/5    Left:     Upper extremity   4/5  Lower extremity   5/5     Lower extremity   4-/5 Tone and bulk:normal tone throughout; no atrophy noted Sensory: Pinprick and light touch intact throughout, bilaterally Deep Tendon  Reflexes: Symmetric throughout Plantars: Right: downgoing   Left: downgoing Cerebellar: Normal finger-to-nose and normal heel-to-shin testing bilaterally Gait: not tested due to safety concerns    Laboratory Studies:  Basic Metabolic Panel: Recent Labs  Lab 06/18/19 0642  NA 140  K 4.1  CL 109  CO2 21*  GLUCOSE 140*  BUN 24*  CREATININE 0.80  CALCIUM 9.6    Liver Function Tests: Recent Labs  Lab 06/18/19 0642  AST 20  ALT 19  ALKPHOS 67  BILITOT 0.6  PROT 7.0  ALBUMIN 4.2   No results for input(s): LIPASE, AMYLASE in the last 168 hours. No results for input(s): AMMONIA in the last 168 hours.  CBC: Recent Labs  Lab 06/18/19 0642  WBC 12.2*  NEUTROABS 11.0*  HGB 14.9  HCT 44.4  MCV 87.9  PLT 195    Cardiac Enzymes: No results for input(s): CKTOTAL, CKMB, CKMBINDEX, TROPONINI in the last 168 hours.  BNP: Invalid input(s): POCBNP  CBG: No results for input(s): GLUCAP in the last 168 hours.  Microbiology: Results for orders placed or performed during the hospital encounter of 06/18/19  SARS Coronavirus 2 by RT PCR (  hospital order, performed in [facility] hospital lab) Nasopharyngeal Nasopharyngeal Swab     Status: None   Collection Time: 06/18/19  6:43 AM   Specimen: Nasopharyngeal Swab  Result Value Ref Range Status   SARS Coronavirus 2 NEGATIVE NEGATIVE Final    Comment: (NOTE) If result is NEGATIVE SARS-CoV-2 target nucleic acids are NOT DETECTED. The SARS-CoV-2 RNA is generally detectable in upper and lower  respiratory specimens during the acute phase of infection. The lowest  concentration of SARS-CoV-2 viral copies this assay can detect is 250  copies / mL. A negative result does not preclude SARS-CoV-2 infection  and should not be used as the sole basis for treatment or other  patient management decisions.  A negative result may occur with  improper specimen collection / handling, submission of specimen other  than nasopharyngeal swab,  presence of viral mutation(s) within the  areas targeted by this assay, and inadequate number of viral copies  (<250 copies / mL). A negative result must be combined with clinical  observations, patient history, and epidemiological information. If result is POSITIVE SARS-CoV-2 target nucleic acids are DETECTED. The SARS-CoV-2 RNA is generally detectable in upper and lower  respiratory specimens dur ing the acute phase of infection.  Positive  results are indicative of active infection with SARS-CoV-2.  Clinical  correlation with patient history and other diagnostic information is  necessary to determine patient infection status.  Positive results do  not rule out bacterial infection or co-infection with other viruses. If result is PRESUMPTIVE POSTIVE SARS-CoV-2 nucleic acids MAY BE PRESENT.   A presumptive positive result was obtained on the submitted specimen  and confirmed on repeat testing.  While 2019 novel coronavirus  (SARS-CoV-2) nucleic acids may be present in the submitted sample  additional confirmatory testing may be necessary for epidemiological  and / or clinical management purposes  to differentiate between  SARS-CoV-2 and other Sarbecovirus currently known to infect humans.  If clinically indicated additional testing with an alternate test  methodology [phone]) is advised. The SARS-CoV-2 RNA is generally  detectable in upper and lower respiratory sp ecimens during the acute  phase of infection. The expected result is Negative. Fact Sheet for Patients:  StrictlyIdeas.no Fact Sheet for Healthcare Providers: BankingDealers.co.za This test is not yet approved or cleared by the [place] FDA and has been authorized for detection and/or diagnosis of SARS-CoV-2 by FDA under an Emergency Use Authorization (EUA).  This EUA will remain in effect (meaning this test can be used) for the duration of the COVID-19 declaration under  Section 564(b)(1) of the Act, 21 U.S.C. section 360bbb-3(b)(1), unless the authorization is terminated or revoked sooner. Performed at [facility], [address] [place], [place] [address]     Coagulation Studies: No results for input(s): LABPROT, INR in the last 72 hours.  Urinalysis: No results for input(s): COLORURINE, LABSPEC, PHURINE, GLUCOSEU, HGBUR, BILIRUBINUR, KETONESUR, PROTEINUR, UROBILINOGEN, NITRITE, LEUKOCYTESUR in the last 168 hours.  Invalid input(s): APPERANCEUR  Lipid Panel:    Component Value Date/Time   CHOL 169 06/18/2019 0642   TRIG 58 06/18/2019 0642   HDL 37 (L) 06/18/2019 0642   CHOLHDL 4.6 06/18/2019 0642   VLDL 12 06/18/2019 0642   LDLCALC 120 (H) 06/18/2019 0642    HgbA1C:  Lab Results  Component Value Date   HGBA1C 6.1 09/18/2018    Urine Drug Screen:  No results found for: LABOPIA, COCAINSCRNUR, LABBENZ, AMPHETMU, THCU, LABBARB  Alcohol Level:  Recent Labs  Lab 06/18/19  [place] <10    Other results: EKG: sinus rhythm at 92 bpm.  Imaging: Ct Head Wo Contrast  Result Date: 06/18/2019 CLINICAL DATA:  Weakness with left foot dragging EXAM: CT HEAD WITHOUT CONTRAST TECHNIQUE: Contiguous axial images were obtained from the base of the skull through the vertex without intravenous contrast. COMPARISON:  None. FINDINGS: Brain: No evidence of acute infarction, hemorrhage, hydrocephalus, extra-axial collection or mass lesion/mass effect. Vascular: No hyperdense vessel or unexpected calcification. Skull: Normal. Negative for fracture or focal lesion. Sinuses/Orbits: No acute finding. IMPRESSION: Negative head CT. Electronically Signed   By: Monte Fantasia M.D.   On: 06/18/2019 07:15   Mr Brain Wo Contrast  Result Date: 06/18/2019 CLINICAL DATA:  Left lower extremity weakness. EXAM: MRI HEAD WITHOUT CONTRAST TECHNIQUE: Multiplanar, multiecho pulse sequences of the brain and surrounding structures were obtained without intravenous  contrast. COMPARISON:  Head CT 06/18/2019 FINDINGS: Brain: There is an acute right lateral lenticulostriate territory infarct involving the posterior lentiform nucleus, corona radiata, and caudate body. T2 hyperintensities elsewhere in the cerebral white matter bilaterally are nonspecific but compatible with mild chronic small vessel ischemic disease. No intracranial hemorrhage, mass, midline shift, or extra-axial fluid collection is identified. The ventricles and sulci are within normal limits for age. Vascular: Major intracranial vascular flow voids are preserved. Skull and upper cervical spine: Unremarkable bone marrow signal. Sinuses/Orbits: Bilateral cataract extraction. Minimal bilateral ethmoid air cell mucosal thickening. Clear mastoid air cells. Other: None. IMPRESSION: 1. Acute right basal ganglia region infarct. 2. Mild chronic small vessel ischemic disease. Electronically Signed   By: Logan Bores M.D.   On: 06/18/2019 10:46   US Carotid Bilateral (at [facility] Only)  Result Date: 06/18/2019 CLINICAL DATA:  78 year old male with a history of TIA EXAM: BILATERAL CAROTID DUPLEX ULTRASOUND TECHNIQUE: Pearline Cables scale imaging, color Doppler and duplex ultrasound were performed of bilateral carotid and vertebral arteries in the neck. COMPARISON:  None. FINDINGS: Criteria: Quantification of carotid stenosis is based on velocity parameters that correlate the residual internal carotid diameter with NASCET-based stenosis levels, using the diameter of the distal internal carotid lumen as the denominator for stenosis measurement. The following velocity measurements were obtained: RIGHT ICA:  Systolic 88 cm/sec, Diastolic 11 cm/sec CCA:  123456 cm/sec SYSTOLIC ICA/CCA RATIO:  0.8 ECA:  84 cm/sec LEFT ICA:  Systolic 73 cm/sec, Diastolic 13 cm/sec CCA:  123456 cm/sec SYSTOLIC ICA/CCA RATIO:  0.7 ECA:  106 cm/sec Right Brachial SBP: Not acquired Left Brachial SBP: Not acquired RIGHT CAROTID ARTERY: No significant calcified  disease of the right common carotid artery. Intermediate waveform maintained. Heterogeneous plaque without significant calcifications at the right carotid bifurcation. Low resistance waveform of the right ICA. No significant tortuosity. RIGHT VERTEBRAL ARTERY: Antegrade flow with low resistance waveform. LEFT CAROTID ARTERY: No significant calcified disease of the left common carotid artery. Intermediate waveform maintained. Heterogeneous plaque at the left carotid bifurcation without significant calcifications. Low resistance waveform of the left ICA. LEFT VERTEBRAL ARTERY:  Antegrade flow with low resistance waveform. IMPRESSION: Color duplex indicates minimal heterogeneous plaque, with no hemodynamically significant stenosis by duplex criteria in the extracranial cerebrovascular circulation. Signed, Dulcy Fanny. Dellia Nims, RPVI [facility] Electronically Signed   By: Corrie Mckusick D.O.   On: 06/18/2019 11:11    Assessment: 78 y.o. male with a history of HLD presenting with left sided weakness.  MRI of the brain reviewed and shows an acute right BG infarct.  Etiology likely small  vessel disease.  Carotid dopplers show no evidence of hemodynamically significant stenosis.  Echocardiogram pending.  A1c pending, LDL 120.  Patinet on ASA prior to admission.  Stroke Risk Factors - hyperlipidemia  Plan: 1. HgbA1c pending 2. Statin for lipid management with target LDL<70. 3. PT consult, OT consult, Speech consult 4. Echocardiogram pending 5. Prophylactic therapy-Dual antiplatelet therapy with ASA 81mg  and Plavix 75mg  for three weeks with change to Plavix 75mg  daily alone as monotherapy after that time. 6. NPO until RN stroke swallow screen 7. Telemetry monitoring 8. Frequent neuro checks   Alexis Goodell, MD Neurology [phone] 06/18/2019, 1:05 PM

## 2019-06-18 NOTE — Progress Notes (Signed)
OT Cancellation Note  Patient Details Name: Scott [name] MRN: [id number] DOB: [birth year]   Cancelled Treatment:    Reason Eval/Treat Not Completed: Other (comment). Thank you for the OT consult. Order received and chart reviewed. Pt noted to be inpatient status pending transfer to the floor. Will follow acutely and evaluate once pt to the floor and assessed by primary RN.   Shara Blazing, M.S., OTR/L Ascom: [phone] 06/18/19, 2:27 PM

## 2019-06-18 NOTE — Progress Notes (Signed)
Family Meeting Note  Advance Directive:yes  Patient is otherwise very active showman comes in with left lower extremity weakness come found to have acute right basal ganglia infarct. Code status address patient wants to be full code.  Time spent during Scott Chess, MD  16 mins

## 2019-06-18 NOTE — H&P (Signed)
[facility] [place] NAME: Scott [name]    MR#:  [id number]  DATE OF BIRTH:  [birth year]  DATE OF ADMISSION:  06/18/2019  PRIMARY CARE PHYSICIAN: Owens Loffler, MD   REQUESTING/REFERRING PHYSICIAN: Dr Karma Greaser  CHIEF COMPLAINT:   left leg numbness and weakness since yesterday HISTORY OF PRESENT ILLNESS:  Scott [name]  is a 78 y.o. male with a known history of hyperlipidemia acid reflux,arthritis comes to the emergency room after he started noticing weakness on his left lower extremity trying to drag around to walk. Denies any fall. Denies any upper extremity weakness, dysphagia, visual disturbance.  ER CT head was negative. Patient received [name]. MRI was done which shows acute Scott basal ganglia infarct. Patient is being admitted for evaluation of acute stroke.  Patient swallowed well. His placed on regular diet. No family in the room.  PAST MEDICAL HISTORY:   Past Medical History:  Diagnosis Date  . Allergic rhinitis due to pollen   . Arthritis   . GERD (gastroesophageal reflux disease)   . Hyperlipidemia   . Prostate cancer ([place])    cancer  . Scott bundle branch block    RBBB    PAST SURGICAL HISTOIRY:   Past Surgical History:  Procedure Laterality Date  . APPENDECTOMY    . HERNIA REPAIR  85,03   rt and lt ing hernia  . KNEE ARTHROSCOPY  2012   left  . LEFT HEART CATH AND CORONARY ANGIOGRAPHY N/A 05/30/2018   Procedure: LEFT HEART CATH AND CORONARY ANGIOGRAPHY;  Surgeon: Wellington Hampshire, MD;  Location: [facility];  Service: Cardiovascular;  Laterality: N/A;  . prostatectomy  1998  . ROTATOR CUFF REPAIR     Scott  . SHOULDER ARTHROSCOPY Scott 01/29/2013   Procedure: Scott ARTHROSCOPY SHOULDER WITH DEBRIDEMENT, POSSIBLE ROTATOR CUFF REPAIR;  Surgeon: Kerin Salen, MD;  Location: [facility];  Service: Orthopedics;  Laterality: Scott;  . TONSILLECTOMY    . TOTAL HIP ARTHROPLASTY   2007   Scott     SOCIAL HISTORY:   Social History   Tobacco Use  . Smoking status: Never Smoker  . Smokeless tobacco: Never Used  Substance Use Topics  . Alcohol use: No    FAMILY HISTORY:   Family History  Problem Relation Age of Onset  . Cancer Father        prostate  . Cancer Brother        prostate  . Cancer Brother        prostate    DRUG ALLERGIES:  No Known Allergies  REVIEW OF SYSTEMS:  Review of Systems  Constitutional: Negative for chills, fever and weight loss.  HENT: Negative for ear discharge, ear pain and nosebleeds.   Eyes: Negative for blurred vision, pain and discharge.  Respiratory: Negative for sputum production, shortness of breath, wheezing and stridor.   Cardiovascular: Negative for chest pain, palpitations, orthopnea and PND.  Gastrointestinal: Negative for abdominal pain, diarrhea, nausea and vomiting.  Genitourinary: Negative for frequency and urgency.  Musculoskeletal: Negative for back pain and joint pain.  Neurological: Positive for focal weakness. Negative for sensory change, speech change and weakness.  Psychiatric/Behavioral: Negative for depression and hallucinations. The patient is not nervous/anxious.      MEDICATIONS AT HOME:   Prior to Admission medications   Medication Sig Start Date End Date Taking? Authorizing Provider  [name] 81 MG tablet Take 81 mg by mouth daily.  Yes [provider]  cyanocobalamin 2000 MCG tablet Take 2,000 mcg by mouth daily.   Yes [provider]  MOBIC 15 MG tablet Take 15 mg by mouth daily as needed. 06/17/19  Yes [provider]  omeprazole (PRILOSEC) 40 MG capsule Take 1 capsule (40 mg total) by mouth daily. 10/17/18  Yes Copland, Frederico Hamman, MD  Red Yeast Rice 600 MG CAPS Take 2,400 capsules by mouth daily.    Yes [provider]  vitamin E (VITAMIN E) 400 UNIT capsule Take 400 Units by mouth daily.   Yes [provider]      VITAL SIGNS:  Blood  pressure 125/84, pulse 68, temperature 97.6 F (36.4 C), temperature source Oral, resp. rate 18, height 5' 8.5" (1.74 m), weight 79.4 kg, SpO2 98 %.  PHYSICAL EXAMINATION:  GENERAL:  78 y.o.-year-old patient lying in the bed with no acute distress.  EYES: Pupils equal, round, reactive to light and accommodation. No scleral icterus. Extraocular muscles intact.  HEENT: Head atraumatic, normocephalic. Oropharynx and nasopharynx clear.  NECK:  Supple, no jugular venous distention. No thyroid enlargement, no tenderness.  LUNGS: Normal breath sounds bilaterally, no wheezing, rales,rhonchi or crepitation. No use of accessory muscles of respiration.  CARDIOVASCULAR: S1, S2 normal. No murmurs, rubs, or gallops.  ABDOMEN: Soft, nontender, nondistended. Bowel sounds present. No organomegaly or mass.  EXTREMITIES: No pedal edema, cyanosis, or clubbing.  NEUROLOGIC: Cranial nerves II through XII are intact. Muscle strength 4/5 in left LLE Sensation intact. Gait not checked.  PSYCHIATRIC: The patient is alert and oriented x 3.  SKIN: No obvious rash, lesion, or ulcer.   LABORATORY PANEL:   CBC Recent Labs  Lab 06/18/19 0642  WBC 12.2*  HGB 14.9  HCT 44.4  PLT 195   ------------------------------------------------------------------------------------------------------------------  Chemistries  Recent Labs  Lab 06/18/19 0642  NA 140  K 4.1  CL 109  CO2 21*  GLUCOSE 140*  BUN 24*  CREATININE 0.80  CALCIUM 9.6  AST 20  ALT 19  ALKPHOS 67  BILITOT 0.6   ------------------------------------------------------------------------------------------------------------------  Cardiac Enzymes No results for input(s): TROPONINI in the last 168 hours. ------------------------------------------------------------------------------------------------------------------  RADIOLOGY:  Ct Head Wo Contrast  Result Date: 06/18/2019 CLINICAL DATA:  Weakness with left foot dragging EXAM: CT HEAD WITHOUT  CONTRAST TECHNIQUE: Contiguous axial images were obtained from the base of the skull through the vertex without intravenous contrast. COMPARISON:  None. FINDINGS: Brain: No evidence of acute infarction, hemorrhage, hydrocephalus, extra-axial collection or mass lesion/mass effect. Vascular: No hyperdense vessel or unexpected calcification. Skull: Normal. Negative for fracture or focal lesion. Sinuses/Orbits: No acute finding. IMPRESSION: Negative head CT. Electronically Signed   By: Monte Fantasia M.D.   On: 06/18/2019 07:15   Mr Brain Wo Contrast  Result Date: 06/18/2019 CLINICAL DATA:  Left lower extremity weakness. EXAM: MRI HEAD WITHOUT CONTRAST TECHNIQUE: Multiplanar, multiecho pulse sequences of the brain and surrounding structures were obtained without intravenous contrast. COMPARISON:  Head CT 06/18/2019 FINDINGS: Brain: There is an acute Scott lateral lenticulostriate territory infarct involving the posterior lentiform nucleus, corona radiata, and caudate body. T2 hyperintensities elsewhere in the cerebral white matter bilaterally are nonspecific but compatible with mild chronic small vessel ischemic disease. No intracranial hemorrhage, mass, midline shift, or extra-axial fluid collection is identified. The ventricles and sulci are within normal limits for age. Vascular: Major intracranial vascular flow voids are preserved. Skull and upper cervical spine: Unremarkable bone marrow signal. Sinuses/Orbits: Bilateral cataract extraction. Minimal bilateral ethmoid air cell mucosal thickening.  Clear mastoid air cells. Other: None. IMPRESSION: 1. Acute Scott basal ganglia region infarct. 2. Mild chronic small vessel ischemic disease. Electronically Signed   By: Logan Bores M.D.   On: 06/18/2019 10:46   US Carotid Bilateral (at [facility] Only)  Result Date: 06/18/2019 CLINICAL DATA:  78 year old male with a history of TIA EXAM: BILATERAL CAROTID DUPLEX ULTRASOUND TECHNIQUE: Pearline Cables scale imaging, color  Doppler and duplex ultrasound were performed of bilateral carotid and vertebral arteries in the neck. COMPARISON:  None. FINDINGS: Criteria: Quantification of carotid stenosis is based on velocity parameters that correlate the residual internal carotid diameter with NASCET-based stenosis levels, using the diameter of the distal internal carotid lumen as the denominator for stenosis measurement. The following velocity measurements were obtained: Scott ICA:  Systolic 88 cm/sec, Diastolic 11 cm/sec CCA:  123456 cm/sec SYSTOLIC ICA/CCA RATIO:  0.8 ECA:  84 cm/sec LEFT ICA:  Systolic 73 cm/sec, Diastolic 13 cm/sec CCA:  123456 cm/sec SYSTOLIC ICA/CCA RATIO:  0.7 ECA:  106 cm/sec Scott Brachial SBP: Not acquired Left Brachial SBP: Not acquired Scott CAROTID ARTERY: No significant calcified disease of the Scott common carotid artery. Intermediate waveform maintained. Heterogeneous plaque without significant calcifications at the Scott carotid bifurcation. Low resistance waveform of the Scott ICA. No significant tortuosity. Scott VERTEBRAL ARTERY: Antegrade flow with low resistance waveform. LEFT CAROTID ARTERY: No significant calcified disease of the left common carotid artery. Intermediate waveform maintained. Heterogeneous plaque at the left carotid bifurcation without significant calcifications. Low resistance waveform of the left ICA. LEFT VERTEBRAL ARTERY:  Antegrade flow with low resistance waveform. IMPRESSION: Color duplex indicates minimal heterogeneous plaque, with no hemodynamically significant stenosis by duplex criteria in the extracranial cerebrovascular circulation. Signed, Dulcy Fanny. Dellia Nims, RPVI [facility] Electronically Signed   By: Corrie Mckusick D.O.   On: 06/18/2019 11:11    EKG:    IMPRESSION AND PLAN:   Scott [name]  is a 78 y.o. male with a known history of hyperlipidemia acid reflux,arthritis comes to the emergency room after he  started noticing weakness on his left lower extremity trying to drag around to walk.  1. Acute Scott [name], Scott [name]  2. Hyperlipidemia -on Lipitor  3. Acid reflux -continue PPI  4. Physical therapy, occupational and speech therapy to see patient  Discussed with patient agrees with plan  All the records are reviewed and case discussed with ED provider.   CODE STATUS: full  TOTAL TIME TAKING CARE OF THIS PATIENT: *50 minutes.    Fritzi Mandes M.D on 06/18/2019 at 11:58 AM  Between 7am to 6pm - Pager - [phone]  After 6pm go to [email or web address] - password [email or web address] [facility]  Office  [phone]  CC: Primary care physician; Owens Loffler, MD

## 2019-06-18 NOTE — ED Notes (Signed)
Patient transported to MRI 

## 2019-06-18 NOTE — ED Notes (Signed)
Pt to CT

## 2019-06-19 DIAGNOSIS — I639 Cerebral infarction, unspecified: Secondary | ICD-10-CM | POA: Diagnosis not present

## 2019-06-19 DIAGNOSIS — R001 Bradycardia, unspecified: Secondary | ICD-10-CM

## 2019-06-19 LAB — CBC
HCT: 45.6 % (ref 39.0–52.0)
Hemoglobin: 15.1 g/dL (ref 13.0–17.0)
MCH: 29.5 pg (ref 26.0–34.0)
MCHC: 33.1 g/dL (ref 30.0–36.0)
MCV: 89.2 fL (ref 80.0–100.0)
Platelets: 203 10*3/uL (ref 150–400)
RBC: 5.11 MIL/uL (ref 4.22–5.81)
RDW: 14.6 % (ref 11.5–15.5)
WBC: 11.4 10*3/uL — ABNORMAL HIGH (ref 4.0–10.5)
nRBC: 0 % (ref 0.0–0.2)

## 2019-06-19 LAB — URINALYSIS, ROUTINE W REFLEX MICROSCOPIC
Bilirubin Urine: NEGATIVE
Glucose, UA: NEGATIVE mg/dL
Hgb urine dipstick: NEGATIVE
Ketones, ur: NEGATIVE mg/dL
Leukocytes,Ua: NEGATIVE
Nitrite: NEGATIVE
Protein, ur: NEGATIVE mg/dL
Specific Gravity, Urine: 1.023 (ref 1.005–1.030)
pH: 6 (ref 5.0–8.0)

## 2019-06-19 MED ORDER — ACETAMINOPHEN 325 MG PO TABS: 650.0000 mg | ORAL_TABLET | Freq: Four times a day (QID) | ORAL | Status: AC | PRN

## 2019-06-19 MED ORDER — CLOPIDOGREL BISULFATE 75 MG PO TABS: 75.0000 mg | ORAL_TABLET | Freq: Every day | ORAL | 1 refills | Status: DC

## 2019-06-19 MED ORDER — ATORVASTATIN CALCIUM 40 MG PO TABS: 40.0000 mg | ORAL_TABLET | Freq: Every day | ORAL | 0 refills | Status: DC

## 2019-06-19 MED ORDER — GUAIFENESIN-DM 100-10 MG/5ML PO SYRP: 5.0000 mL | ORAL_SOLUTION | Freq: Four times a day (QID) | ORAL | 0 refills | Status: DC | PRN

## 2019-06-19 MED ORDER — ASPIRIN 81 MG PO TABS: 81.0000 mg | ORAL_TABLET | Freq: Every day | ORAL | 0 refills | Status: AC

## 2019-06-19 NOTE — Progress Notes (Signed)
Scott [name]  A and O x 4. VSS. Pt tolerating diet well. No complaints of pain or nausea. IV removed intact, prescriptions given. Pt voiced understanding of discharge instructions with no further questions. Pt discharged via wheelchair NT.    Allergies as of 06/19/2019   No Known Allergies     Medication List    TAKE these medications   acetaminophen 325 MG tablet Commonly known as: TYLENOL Take 2 tablets (650 mg total) by mouth every 6 (six) hours as needed for mild pain (or temp > 37.5 C (99.5 F)).   aspirin 81 MG tablet Take 1 tablet (81 mg total) by mouth daily. Take aspirin 81 mg once daily by mouth for 3 weeks(21 days) and then stop What changed: additional instructions   atorvastatin 40 MG tablet Commonly known as: LIPITOR Take 1 tablet (40 mg total) by mouth daily at 6 PM.   clopidogrel 75 MG tablet Commonly known as: Plavix Take 1 tablet (75 mg total) by mouth daily.   cyanocobalamin 2000 MCG tablet Take 2,000 mcg by mouth daily.   guaiFENesin-dextromethorphan 100-10 MG/5ML syrup Commonly known as: ROBITUSSIN DM Take 5 mLs by mouth every 6 (six) hours as needed for cough.   Mobic 15 MG tablet Generic drug: meloxicam Take 15 mg by mouth daily as needed.   omeprazole 40 MG capsule Commonly known as: PRILOSEC Take 1 capsule (40 mg total) by mouth daily.   Red Yeast Rice 600 MG Caps Take 2,400 capsules by mouth daily.   vitamin E 400 UNIT capsule Generic drug: vitamin E Take 400 Units by mouth daily.       Vitals:   06/19/19 0425 06/19/19 0636  BP: 112/72 128/81  Pulse: 61 66  Resp: 16 16  Temp:  98.1 F (36.7 C)  SpO2: 95% 95%    Scott [name]

## 2019-06-19 NOTE — Evaluation (Signed)
Occupational Therapy Evaluation Patient Details Name: Scott [name] MRN: [id number] DOB: [birth year] Today's Date: 06/19/2019    History of Present Illness 78 y.o. male with a history of HLD presenting with left sided weakness.  MRI of the brain reviewed and shows an acute right BG infarct   Clinical Impression   Pt seen for OT evaluation this date. Prior to hospital admission, pt was active and independent. Pt and spouse report caring for their adopted granddaughter. Pt denies deficits at this time, wife agrees. Upon assessment, pt demonstrates mild L shoulder strength deficits but pt reports hx of old shoulder injury and denies worsening deficits. Pt demo's very mild FMC deficits and when asked, pt reports "it just doesn't feel like I have all my strength" in L hand. Pt/spouse instructed in [facility] ex for hand. Pt denies functional limitations resulting from [facility]. Pt would benefit from skilled OT to address noted impairments and functional limitations (see below for any additional details) in order to maximize safety and independence while minimizing falls risk and caregiver burden. Do not anticipate need for skilled OT services following hospitalization.     Follow Up Recommendations  No OT follow up    Equipment Recommendations  None recommended by OT    Recommendations for Other Services       Precautions / Restrictions Precautions Precautions: Fall Restrictions Weight Bearing Restrictions: No      Mobility Bed Mobility Overal bed mobility: Independent                Transfers Overall transfer level: Needs assistance Equipment used: None Transfers: Sit to/from Stand Sit to Stand: Supervision              Balance Overall balance assessment: Mild deficits observed, not formally tested                                         ADL either performed or assessed with clinical judgement   ADL Overall ADL's : Modified independent                                        General ADL Comments: supervision for functional ADL transfers, pt denies difficulties     Vision Patient Visual Report: No change from baseline Vision Assessment?: No apparent visual deficits     Perception     Praxis      Pertinent Vitals/Pain Pain Assessment: No/denies pain     Hand Dominance Right   Extremity/Trunk Assessment Upper Extremity Assessment Upper Extremity Assessment: LUE deficits/detail LUE Deficits / Details: hx shoulder injury, shoulder flexion grossly 4/5, otherwise WFL, denies sensory deficits; very mild FMC deficits appreciated but functionally not limiting LUE Sensation: WNL LUE Coordination: decreased fine motor   Lower Extremity Assessment Lower Extremity Assessment: Defer to PT evaluation;Overall WFL for tasks assessed(grossly at least 4+/5 bilaterally, pt denies sensory deficits)   Cervical / Trunk Assessment Cervical / Trunk Assessment: Normal   Communication Communication Communication: No difficulties   Cognition Arousal/Alertness: Awake/alert Behavior During Therapy: WFL for tasks assessed/performed Overall Cognitive Status: Within Functional Limits for tasks assessed                                 General Comments: wife present, confirms  pt presents with baseline cognition   General Comments       Exercises Other Exercises Other Exercises: Pt instructed briefly in [facility] ex for LUE   Shoulder Instructions      Home Living Family/patient expects to be discharged to:: Private residence Living Arrangements: Spouse/significant other;Other relatives Available Help at Discharge: Family;Available 24 hours/day Type of Home: House Home Access: Stairs to enter [employer] of Steps: 6-7 Entrance Stairs-Rails: Can reach both;Left;Right Home Layout: One level               Home Equipment: None          Prior Functioning/Environment Level of Independence: Independent                  OT Problem List: Decreased coordination      OT Treatment/Interventions: Self-care/ADL training;Therapeutic exercise;Therapeutic activities;Neuromuscular education;DME and/or AE instruction;Patient/family education    OT Goals(Current goals can be found in the care plan section) Acute Rehab OT Goals Patient Stated Goal: go home OT Goal Formulation: With patient/family Time For Goal Achievement: 07/03/19 Potential to Achieve Goals: Good ADL Goals Pt/caregiver will Perform Home Exercise Program: Left upper extremity;Independently;With written HEP provided(LUE [place])  OT Frequency: Min 1X/week   Barriers to D/C:            Co-evaluation              AM-PAC OT "6 Clicks" Daily Activity     Outcome Measure Help from another person eating meals?: None Help from another person taking care of personal grooming?: None Help from another person toileting, which includes using toliet, bedpan, or urinal?: None Help from another person bathing (including washing, rinsing, drying)?: None Help from another person to put on and taking off regular upper body clothing?: None Help from another person to put on and taking off regular lower body clothing?: None 6 Click Score: 24   End of Session    Activity Tolerance: Patient tolerated treatment well Patient left: in bed;with call bell/phone within reach;with family/visitor present;Other (comment)(seated EOB, MD in room)  OT Visit Diagnosis: Other abnormalities of gait and mobility (R26.89)                Time: 1000-1008 OT Time Calculation (min): 8 min Charges:  OT General Charges $OT Visit: 1 Visit OT Evaluation $OT Eval Low Complexity: 1 Low  Jeni Salles, MPH, MS, OTR/L ascom [phone] 06/19/19, 11:53 AM

## 2019-06-19 NOTE — Discharge Summary (Signed)
[facility] [place] NAME: Scott [name]    MR#:  [id number]  DATE OF BIRTH:  Apr 21, 1941  DATE OF ADMISSION:  06/18/2019 ADMITTING PHYSICIAN: Fritzi Mandes, MD  DATE OF DISCHARGE: 06/19/19  PRIMARY CARE PHYSICIAN: Owens Loffler, MD    ADMISSION DIAGNOSIS:  CVA (cerebral vascular accident) ([place]) [I63.9] Left leg weakness [R29.898] Cerebrovascular accident (CVA), unspecified mechanism ([place]) [I63.9]  DISCHARGE DIAGNOSIS:  Acute CVA  SECONDARY DIAGNOSIS:   Past Medical History:  Diagnosis Date  . Allergic rhinitis due to pollen   . Arthritis   . GERD (gastroesophageal reflux disease)   . Hyperlipidemia   . Prostate cancer ([place])    cancer  . Right bundle branch block    RBBB    HOSPITAL COURSE:  HPI Scott [name]  is a 78 y.o. male with a known history of hyperlipidemia acid reflux,arthritis comes to the emergency room after he started noticing weakness on his left lower extremity trying to drag around to walk. Denies any fall. Denies any upper extremity weakness, dysphagia, visual disturbance.  ER CT head was negative. Patient received aspirin. MRI was done which shows acute right basal ganglia infarct. Patient is being admitted for evaluation of acute stroke.  Patient swallowed well. His placed on regular diet. No family in the room.  1. Acute CVA- right basal ganglia infarct MRI of the brain with right basal ganglia infarct- Echocardiogram with ejection fraction 60 to 65%  LDL at 120 goal less than 70-discharge with high intensity statin -neurology consultation with Dr. Doy Mince.  Recommending aspirin 81 mg and Plavix 75 mg for 3 weeks for prophylaxis followed by Plavix once daily -ultrasound carotid Doppler no significant stenosis -PT is recommending outpatient physical therapy, no Occupational Therapy needs identified no speech therapy needs identified okay to discharge patient from neurology standpoint and  outpatient follow-up in neurology in 1 month -Outpatient physical therapy  2. Hyperlipidemia -on Lipitor-high intensity  3. Acid reflux -continue PPI  4.   Leukocytosis-probably stress related/reactive Trending down and patient is afebrile.  Clinically asymptomatic.  Just continue monitoring Covid test is negative  Discharge patient home  DISCHARGE CONDITIONS:   stable  CONSULTS OBTAINED:  Treatment Team:  Alexis Goodell, MD   PROCEDURES  None   DRUG ALLERGIES:  No Known Allergies  DISCHARGE MEDICATIONS:   Allergies as of 06/19/2019   No Known Allergies     Medication List    TAKE these medications   acetaminophen 325 MG tablet Commonly known as: TYLENOL Take 2 tablets (650 mg total) by mouth every 6 (six) hours as needed for mild pain (or temp > 37.5 C (99.5 F)).   aspirin 81 MG tablet Take 1 tablet (81 mg total) by mouth daily. Take aspirin 81 mg once daily by mouth for 3 weeks(21 days) and then stop What changed: additional instructions   atorvastatin 40 MG tablet Commonly known as: LIPITOR Take 1 tablet (40 mg total) by mouth daily at 6 PM.   clopidogrel 75 MG tablet Commonly known as: Plavix Take 1 tablet (75 mg total) by mouth daily.   cyanocobalamin 2000 MCG tablet Take 2,000 mcg by mouth daily.   guaiFENesin-dextromethorphan 100-10 MG/5ML syrup Commonly known as: ROBITUSSIN DM Take 5 mLs by mouth every 6 (six) hours as needed for cough.   Mobic 15 MG tablet Generic drug: meloxicam Take 15 mg by mouth daily as needed.   omeprazole 40 MG capsule Commonly known as: PRILOSEC Take 1 capsule (  40 mg total) by mouth daily.   Red Yeast Rice 600 MG Caps Take 2,400 capsules by mouth daily.   vitamin E 400 UNIT capsule Generic drug: vitamin E Take 400 Units by mouth daily.        DISCHARGE INSTRUCTIONS:   Follow-up with primary care physician in 3 days Follow-up with neurology Dr. Manuella Ghazi in 1 month Outpatient PT  DIET:  Cardiac  diet  DISCHARGE CONDITION:  Fair  ACTIVITY:  Activity as tolerated  OXYGEN:  Home Oxygen: No.   Oxygen Delivery: room air  DISCHARGE LOCATION:  home   If you experience worsening of your admission symptoms, develop shortness of breath, life threatening emergency, suicidal or homicidal thoughts you must seek medical attention immediately by calling 911 or calling your MD immediately  if symptoms less severe.  You Must read complete instructions/literature along with all the possible adverse reactions/side effects for all the Medicines you take and that have been prescribed to you. Take any new Medicines after you have completely understood and accpet all the possible adverse reactions/side effects.   Please note  You were cared for by a hospitalist during your hospital stay. If you have any questions about your discharge medications or the care you received while you were in the hospital after you are discharged, you can call the unit and asked to speak with the hospitalist on call if the hospitalist that took care of you is not available. Once you are discharged, your primary care physician will handle any further medical issues. Please note that NO REFILLS for any discharge medications will be authorized once you are discharged, as it is imperative that you return to your primary care physician (or establish a relationship with a primary care physician if you do not have one) for your aftercare needs so that they can reassess your need for medications and monitor your lab values.     Today  Chief Complaint  Patient presents with  . Weakness   Patient is feeling much better.  The left leg numbness and weakness are significantly improving.  Okay to discharge patient from neurology standpoint.  Patient is tolerating diet okay.  Plan is to continue aspirin 81 mg and Plavix 75 mg once daily for 3 weeks followed by Plavix 75 mg only to be continued  ROS:  CONSTITUTIONAL: Denies fevers,  chills. Denies any fatigue, weakness.  EYES: Denies blurry vision, double vision, eye pain. EARS, NOSE, THROAT: Denies tinnitus, ear pain, hearing loss. RESPIRATORY: Denies cough, wheeze, shortness of breath.  CARDIOVASCULAR: Denies chest pain, palpitations, edema.  GASTROINTESTINAL: Denies nausea, vomiting, diarrhea, abdominal pain. Denies bright red blood per rectum. GENITOURINARY: Denies dysuria, hematuria. ENDOCRINE: Denies nocturia or thyroid problems. HEMATOLOGIC AND LYMPHATIC: Denies easy bruising or bleeding. SKIN: Denies rash or lesion. MUSCULOSKELETAL: Denies pain in neck, back, shoulder, knees, hips or arthritic symptoms.  NEUROLOGIC: Denies paralysis, paresthesias.  PSYCHIATRIC: Denies anxiety or depressive symptoms.   VITAL SIGNS:  Blood pressure 128/81, pulse 66, temperature 98.1 F (36.7 C), temperature source Oral, resp. rate 16, height 5\' 8"  (1.727 m), weight 81.8 kg, SpO2 95 %.  I/O:    Intake/Output Summary (Last 24 hours) at 06/19/2019 1109 Last data filed at 06/19/2019 0900 Gross per 24 hour  Intake 1043.75 ml  Output -  Net 1043.75 ml    PHYSICAL EXAMINATION:  GENERAL:  77 y.o.-year-old patient lying in the bed with no acute distress.  EYES: Pupils equal, round, reactive to light and accommodation. No scleral icterus. Extraocular  muscles intact.  HEENT: Head atraumatic, normocephalic. Oropharynx and nasopharynx clear.  NECK:  Supple, no jugular venous distention. No thyroid enlargement, no tenderness.  LUNGS: Normal breath sounds bilaterally, no wheezing, rales,rhonchi or crepitation. No use of accessory muscles of respiration.  CARDIOVASCULAR: S1, S2 normal. No murmurs, rubs, or gallops.  ABDOMEN: Soft, non-tender, non-distended. Bowel sounds present.  EXTREMITIES: No pedal edema, cyanosis, or clubbing.  NEUROLOGIC: Cranial nerves II through XII are intact. Muscle strength 5/5 in all extremities. Sensation intact. Gait not checked.  PSYCHIATRIC: The  patient is alert and oriented x 3.  SKIN: No obvious rash, lesion, or ulcer.   DATA REVIEW:   CBC Recent Labs  Lab 06/19/19 0934  WBC 11.4*  HGB 15.1  HCT 45.6  PLT 203    Chemistries  Recent Labs  Lab 06/18/19 0642  NA 140  K 4.1  CL 109  CO2 21*  GLUCOSE 140*  BUN 24*  CREATININE 0.80  CALCIUM 9.6  AST 20  ALT 19  ALKPHOS 67  BILITOT 0.6    Cardiac Enzymes No results for input(s): TROPONINI in the last 168 hours.  Microbiology Results  Results for orders placed or performed during the hospital encounter of 06/18/19  SARS Coronavirus 2 by RT PCR (hospital order, performed in [facility] hospital lab) Nasopharyngeal Nasopharyngeal Swab     Status: None   Collection Time: 06/18/19  6:43 AM   Specimen: Nasopharyngeal Swab  Result Value Ref Range Status   SARS Coronavirus 2 NEGATIVE NEGATIVE Final    Comment: (NOTE) If result is NEGATIVE SARS-CoV-2 target nucleic acids are NOT DETECTED. The SARS-CoV-2 RNA is generally detectable in upper and lower  respiratory specimens during the acute phase of infection. The lowest  concentration of SARS-CoV-2 viral copies this assay can detect is 250  copies / mL. A negative result does not preclude SARS-CoV-2 infection  and should not be used as the sole basis for treatment or other  patient management decisions.  A negative result may occur with  improper specimen collection / handling, submission of specimen other  than nasopharyngeal swab, presence of viral mutation(s) within the  areas targeted by this assay, and inadequate number of viral copies  (<250 copies / mL). A negative result must be combined with clinical  observations, patient history, and epidemiological information. If result is POSITIVE SARS-CoV-2 target nucleic acids are DETECTED. The SARS-CoV-2 RNA is generally detectable in upper and lower  respiratory specimens dur ing the acute phase of infection.  Positive  results are indicative of active  infection with SARS-CoV-2.  Clinical  correlation with patient history and other diagnostic information is  necessary to determine patient infection status.  Positive results do  not rule out bacterial infection or co-infection with other viruses. If result is PRESUMPTIVE POSTIVE SARS-CoV-2 nucleic acids MAY BE PRESENT.   A presumptive positive result was obtained on the submitted specimen  and confirmed on repeat testing.  While 2019 novel coronavirus  (SARS-CoV-2) nucleic acids may be present in the submitted sample  additional confirmatory testing may be necessary for epidemiological  and / or clinical management purposes  to differentiate between  SARS-CoV-2 and other Sarbecovirus currently known to infect humans.  If clinically indicated additional testing with an alternate test  methodology [phone]) is advised. The SARS-CoV-2 RNA is generally  detectable in upper and lower respiratory sp ecimens during the acute  phase of infection. The expected result is Negative. Fact Sheet for Patients:  StrictlyIdeas.no Fact Sheet for  Healthcare Providers: BankingDealers.co.za This test is not yet approved or cleared by the [place] and has been authorized for detection and/or diagnosis of SARS-CoV-2 by FDA under an Emergency Use Authorization (EUA).  This EUA will remain in effect (meaning this test can be used) for the duration of the COVID-19 declaration under Section 564(b)(1) of the Act, 21 U.S.C. section 360bbb-3(b)(1), unless the authorization is terminated or revoked sooner. Performed at [facility], [address] [place], [place] [address]     RADIOLOGY:  Ct Head Wo Contrast  Result Date: 06/18/2019 CLINICAL DATA:  Weakness with left foot dragging EXAM: CT HEAD WITHOUT CONTRAST TECHNIQUE: Contiguous axial images were obtained from the base of the skull through the vertex without intravenous contrast.  COMPARISON:  None. FINDINGS: Brain: No evidence of acute infarction, hemorrhage, hydrocephalus, extra-axial collection or mass lesion/mass effect. Vascular: No hyperdense vessel or unexpected calcification. Skull: Normal. Negative for fracture or focal lesion. Sinuses/Orbits: No acute finding. IMPRESSION: Negative head CT. Electronically Signed   By: Monte Fantasia M.D.   On: 06/18/2019 07:15   Mr Brain Wo Contrast  Result Date: 06/18/2019 CLINICAL DATA:  Left lower extremity weakness. EXAM: MRI HEAD WITHOUT CONTRAST TECHNIQUE: Multiplanar, multiecho pulse sequences of the brain and surrounding structures were obtained without intravenous contrast. COMPARISON:  Head CT 06/18/2019 FINDINGS: Brain: There is an acute right lateral lenticulostriate territory infarct involving the posterior lentiform nucleus, corona radiata, and caudate body. T2 hyperintensities elsewhere in the cerebral white matter bilaterally are nonspecific but compatible with mild chronic small vessel ischemic disease. No intracranial hemorrhage, mass, midline shift, or extra-axial fluid collection is identified. The ventricles and sulci are within normal limits for age. Vascular: Major intracranial vascular flow voids are preserved. Skull and upper cervical spine: Unremarkable bone marrow signal. Sinuses/Orbits: Bilateral cataract extraction. Minimal bilateral ethmoid air cell mucosal thickening. Clear mastoid air cells. Other: None. IMPRESSION: 1. Acute right basal ganglia region infarct. 2. Mild chronic small vessel ischemic disease. Electronically Signed   By: Logan Bores M.D.   On: 06/18/2019 10:46   US Carotid Bilateral (at [facility] Only)  Result Date: 06/18/2019 CLINICAL DATA:  78 year old male with a history of TIA EXAM: BILATERAL CAROTID DUPLEX ULTRASOUND TECHNIQUE: Pearline Cables scale imaging, color Doppler and duplex ultrasound were performed of bilateral carotid and vertebral arteries in the neck. COMPARISON:  None. FINDINGS:  Criteria: Quantification of carotid stenosis is based on velocity parameters that correlate the residual internal carotid diameter with NASCET-based stenosis levels, using the diameter of the distal internal carotid lumen as the denominator for stenosis measurement. The following velocity measurements were obtained: RIGHT ICA:  Systolic 88 cm/sec, Diastolic 11 cm/sec CCA:  123456 cm/sec SYSTOLIC ICA/CCA RATIO:  0.8 ECA:  84 cm/sec LEFT ICA:  Systolic 73 cm/sec, Diastolic 13 cm/sec CCA:  123456 cm/sec SYSTOLIC ICA/CCA RATIO:  0.7 ECA:  106 cm/sec Right Brachial SBP: Not acquired Left Brachial SBP: Not acquired RIGHT CAROTID ARTERY: No significant calcified disease of the right common carotid artery. Intermediate waveform maintained. Heterogeneous plaque without significant calcifications at the right carotid bifurcation. Low resistance waveform of the right ICA. No significant tortuosity. RIGHT VERTEBRAL ARTERY: Antegrade flow with low resistance waveform. LEFT CAROTID ARTERY: No significant calcified disease of the left common carotid artery. Intermediate waveform maintained. Heterogeneous plaque at the left carotid bifurcation without significant calcifications. Low resistance waveform of the left ICA. LEFT VERTEBRAL ARTERY:  Antegrade flow with low resistance waveform. IMPRESSION: Color duplex indicates minimal heterogeneous plaque, with  no hemodynamically significant stenosis by duplex criteria in the extracranial cerebrovascular circulation. Signed, Dulcy Fanny. Dellia Nims, RPVI [facility] Electronically Signed   By: Corrie Mckusick D.O.   On: 06/18/2019 11:11    EKG:   Orders placed or performed during the hospital encounter of 06/18/19  . EKG 12-Lead  . EKG 12-Lead  . EKG 12-Lead  . EKG 12-Lead  . EKG 12-Lead  . EKG 12-Lead  . EKG 12-Lead  . EKG 12-Lead  . EKG 12-Lead  . EKG 12-Lead      Management plans discussed with the patient, wife at  bedside and they are in agreement.  CODE STATUS:     Code Status Orders  (From admission, onward)         Start     Ordered   06/18/19 0834  Full code  Continuous     06/18/19 0833        Code Status History    Date Active Date Inactive Code Status Order ID Comments User Context   05/30/2018 0814 05/30/2018 1835 Full Code [id number]  Arta Silence, MD Inpatient   Advance Care Planning Activity    Advance Directive Documentation     Most Recent Value  Type of Advance Directive  Living will  Pre-existing out of facility DNR order (yellow form or pink MOST form)  -  "MOST" Form in Place?  -      TOTAL TIME TAKING CARE OF THIS PATIENT: 45  minutes.   Note: This dictation was prepared with Dragon dictation along with smaller phrase technology. Any transcriptional errors that result from this process are unintentional.   @MEC @  on 06/19/2019 at 11:09 AM  Between 7am to 6pm - Pager - [phone]  After 6pm go to [email or web address] - password [email or web address] [place] [facility]  Office  [phone]  CC: Primary care physician; Owens Loffler, MD

## 2019-06-19 NOTE — Progress Notes (Signed)
RN notified MD pt's HR has been in the high 40's all morning. Per MD RN to get 12 lead EKG and continue to monitor pt.

## 2019-06-19 NOTE — Discharge Instructions (Signed)
Follow-up with primary care physician in 3 days Follow-up with neurology Dr. Manuella Ghazi in 1 month Outpatient PT

## 2019-06-19 NOTE — Evaluation (Signed)
Physical Therapy Evaluation Patient Details Name: Scott [name] MRN: [id number] DOB: Jul 25, 1941 Today's Date: 06/19/2019   History of Present Illness  78 y.o. male with a history of HLD presenting with left sided weakness.  MRI of the brain reviewed and shows an acute right BG infarct  Clinical Impression  Pt is eager to get up and do some mobility. He reports he is not too far from his baseline but is more limited and most notedly slower the ambulation currently. He showed good overall safety and confidence with mobility, gait, steps but will benefit from outpt PT to work back toward baseline.     Follow Up Recommendations Outpatient PT    Equipment Recommendations  None recommended by PT    Recommendations for Other Services       Precautions / Restrictions Precautions Precautions: Fall Restrictions Weight Bearing Restrictions: No      Mobility  Bed Mobility Overal bed mobility: Independent             General bed mobility comments: Pt easily gets up to EOB  Transfers Overall transfer level: Modified independent Equipment used: None Transfers: Sit to/from Stand Sit to Stand: Modified independent (Device/Increase time)         General transfer comment: Pt with good confidence and balance getting to standing  Ambulation/Gait Ambulation/Gait assistance: Modified independent (Device/Increase time) Gait Distance (Feet): 250 Feet Assistive device: None       General Gait Details: Pt with no LOBs or safety issues but did have limp (again reports this is baseline from old injuries).  He does report, however, that he is considerably slower than his baseline.  Stairs Stairs: Yes Stairs assistance: Modified independent (Device/Increase time) Stair Management: One rail Right;Alternating pattern;Step to pattern Number of Stairs: 12 General stair comments: Pt was able to do some reciprocating steps but lacked confidence and PT suggested step-to strategy.  Pt  tried this and agreed that it was better/safer at this point.    Wheelchair Mobility    Modified Rankin (Stroke Patients Only)       Balance Overall balance assessment: Mild deficits observed, not formally tested(no stagger stepping or overt LOBs t/o all functional tasks)                                           Pertinent Vitals/Pain Pain Assessment: No/denies pain    Home Living Family/patient expects to be discharged to:: Private residence Living Arrangements: Spouse/significant other;Other relatives Available Help at Discharge: Family;Available 24 hours/day Type of Home: House Home Access: Stairs to enter Entrance Stairs-Rails: Can reach both;Left;Right Entrance Stairs-Number of Steps: 6-7 Home Layout: One level Home Equipment: None      Prior Function Level of Independence: Independent         Comments: Pt reports that he works out at [employer] regularly, drives, has no issues     Hand Dominance   Dominant Hand: Right    Extremity/Trunk Assessment   Upper Extremity Assessment Upper Extremity Assessment: Overall WFL for tasks assessed;Defer to OT evaluation(chronic L RTC issue, minimally decreased L quality of motion) LUE Deficits / Details: hx shoulder injury, shoulder flexion grossly 4/5, otherwise WFL, denies sensory deficits; very mild FMC deficits appreciated but functionally not limiting LUE Sensation: WNL LUE Coordination: decreased fine motor    Lower Extremity Assessment Lower Extremity Assessment: Overall WFL for tasks assessed(pt reports minimal issues are  from old injuries, baseline)    Cervical / Trunk Assessment Cervical / Trunk Assessment: Normal  Communication   Communication: No difficulties  Cognition Arousal/Alertness: Awake/alert Behavior During Therapy: WFL for tasks assessed/performed Overall Cognitive Status: Within Functional Limits for tasks assessed                                 General  Comments: wife present, confirms pt presents with baseline cognition      General Comments      Exercises Other Exercises Other Exercises: Pt instructed briefly in [facility] ex for LUE   Assessment/Plan    PT Assessment Patient needs continued PT services  PT Problem List Decreased strength;Decreased coordination;Decreased mobility;Decreased activity tolerance       PT Treatment Interventions Gait training;Stair training;Functional mobility training;Therapeutic activities;Therapeutic exercise;Balance training;Neuromuscular re-education;Patient/family education    PT Goals (Current goals can be found in the Care Plan section)  Acute Rehab PT Goals Patient Stated Goal: go home PT Goal Formulation: With patient Time For Goal Achievement: 07/03/19 Potential to Achieve Goals: Fair    Frequency Min 2X/week   Barriers to discharge        Co-evaluation               AM-PAC PT "6 Clicks" Mobility  Outcome Measure Help needed turning from your back to your side while in a flat bed without using bedrails?: None Help needed moving from lying on your back to sitting on the side of a flat bed without using bedrails?: None Help needed moving to and from a bed to a chair (including a wheelchair)?: None Help needed standing up from a chair using your arms (e.g., wheelchair or bedside chair)?: None Help needed to walk in hospital room?: None Help needed climbing 3-5 steps with a railing? : A Little 6 Click Score: 23    End of Session Equipment Utilized During Treatment: Gait belt Activity Tolerance: Patient tolerated treatment well Patient left: in bed;with call bell/phone within reach(pt refusing alarm) Nurse Communication: Mobility status PT Visit Diagnosis: Muscle weakness (generalized) (M62.81);Other abnormalities of gait and mobility (R26.89)    Time: [id number] PT Time Calculation (min) (ACUTE ONLY): 15 min   Charges:   PT Evaluation $PT Eval Low Complexity: 1 Low           Kreg Shropshire, DPT 06/19/2019, 1:12 PM

## 2019-06-19 NOTE — Progress Notes (Signed)
SLP Cancellation Note  Patient Details Name: Scott [name] MRN: [id number] DOB: 13-Jul-1941   Cancelled treatment:       Reason Eval/Treat Not Completed: SLP screened, no needs identified, will sign off(chart reviewed; consulted NSG then MDs and met w/ pt). Pt denied any difficulty swallowing and is currently on a regular diet; tolerates swallowing pills w/ water per NSG. Pt conversed at conversational level w/out deficits noted; pt and Wife denied any speech-language deficits.  No further skilled ST services indicated as pt appears at his baseline. Pt agreed. NSG to reconsult if any change in status.      Orinda Kenner, MS, CCC-SLP Maliik Karner 06/19/2019, 10:25 AM

## 2019-06-19 NOTE — Progress Notes (Signed)
Subjective: No new neurological complaints.  Wants to go home.    Objective: Current vital signs: BP 128/81 (BP Location: Left Arm)   Pulse 66   Temp 98.1 F (36.7 C) (Oral)   Resp 16   Ht 5\' 8"  (1.727 m)   Wt 81.8 kg   SpO2 95%   BMI 27.42 kg/m  Vital signs in last 24 hours: Temp:  [97.5 F (36.4 C)-98.1 F (36.7 C)] 98.1 F (36.7 C) (10/29 0636) Pulse Rate:  [61-82] 66 (10/29 0636) Resp:  [16-19] 16 (10/29 0636) BP: (112-148)/(71-91) 128/81 (10/29 0636) SpO2:  [95 %-99 %] 95 % (10/29 0636) Weight:  [81.8 kg] 81.8 kg (10/28 1820)  Intake/Output from previous day: 10/28 0701 - 10/29 0700 In: 683.8 [I.V.:683.8] Out: -  Intake/Output this shift: Total I/O In: 360 [P.O.:360] Out: -  Nutritional status:  Diet Order            Diet regular Room service appropriate? Yes; Fluid consistency: Thin  Diet effective now              Neurologic Exam: Mental Status: Alert, oriented, thought content appropriate.  Speech fluent without evidence of aphasia.  Able to follow 3 step commands without difficulty. Cranial Nerves: II: Discs flat bilaterally; Visual fields grossly normal, pupils equal, round, reactive to light and accommodation III,IV, VI: ptosis not present, extra-ocular motions intact bilaterally V,VII: smile symmetric, facial light touch sensation normal bilaterally VIII: hearing normal bilaterally IX,X: gag reflex present XI: bilateral shoulder shrug XII: midline tongue extension Motor: 5/5 on the right and 4/5 on the left Sensory: Pinprick and light touch intact throughout, bilaterally  Lab Results: Basic Metabolic Panel: Recent Labs  Lab 06/18/19 0642  NA 140  K 4.1  CL 109  CO2 21*  GLUCOSE 140*  BUN 24*  CREATININE 0.80  CALCIUM 9.6    Liver Function Tests: Recent Labs  Lab 06/18/19 0642  AST 20  ALT 19  ALKPHOS 67  BILITOT 0.6  PROT 7.0  ALBUMIN 4.2   No results for input(s): LIPASE, AMYLASE in the last 168 hours. No results for  input(s): AMMONIA in the last 168 hours.  CBC: Recent Labs  Lab 06/18/19 0642 06/19/19 0934  WBC 12.2* 11.4*  NEUTROABS 11.0*  --   HGB 14.9 15.1  HCT 44.4 45.6  MCV 87.9 89.2  PLT 195 203    Cardiac Enzymes: No results for input(s): CKTOTAL, CKMB, CKMBINDEX, TROPONINI in the last 168 hours.  Lipid Panel: Recent Labs  Lab 06/18/19 0642  CHOL 169  TRIG 58  HDL 37*  CHOLHDL 4.6  VLDL 12  LDLCALC 120*    CBG: No results for input(s): GLUCAP in the last 168 hours.  Microbiology: Results for orders placed or performed during the hospital encounter of 06/18/19  SARS Coronavirus 2 by RT PCR (hospital order, performed in [facility] hospital lab) Nasopharyngeal Nasopharyngeal Swab     Status: None   Collection Time: 06/18/19  6:43 AM   Specimen: Nasopharyngeal Swab  Result Value Ref Range Status   SARS Coronavirus 2 NEGATIVE NEGATIVE Final    Comment: (NOTE) If result is NEGATIVE SARS-CoV-2 target nucleic acids are NOT DETECTED. The SARS-CoV-2 RNA is generally detectable in upper and lower  respiratory specimens during the acute phase of infection. The lowest  concentration of SARS-CoV-2 viral copies this assay can detect is 250  copies / mL. A negative result does not preclude SARS-CoV-2 infection  and should not be used as the  sole basis for treatment or other  patient management decisions.  A negative result may occur with  improper specimen collection / handling, submission of specimen other  than nasopharyngeal swab, presence of viral mutation(s) within the  areas targeted by this assay, and inadequate number of viral copies  (<250 copies / mL). A negative result must be combined with clinical  observations, patient history, and epidemiological information. If result is POSITIVE SARS-CoV-2 target nucleic acids are DETECTED. The SARS-CoV-2 RNA is generally detectable in upper and lower  respiratory specimens dur ing the acute phase of infection.  Positive   results are indicative of active infection with SARS-CoV-2.  Clinical  correlation with patient history and other diagnostic information is  necessary to determine patient infection status.  Positive results do  not rule out bacterial infection or co-infection with other viruses. If result is PRESUMPTIVE POSTIVE SARS-CoV-2 nucleic acids MAY BE PRESENT.   A presumptive positive result was obtained on the submitted specimen  and confirmed on repeat testing.  While 2019 novel coronavirus  (SARS-CoV-2) nucleic acids may be present in the submitted sample  additional confirmatory testing may be necessary for epidemiological  and / or clinical management purposes  to differentiate between  SARS-CoV-2 and other Sarbecovirus currently known to infect humans.  If clinically indicated additional testing with an alternate test  methodology [phone]) is advised. The SARS-CoV-2 RNA is generally  detectable in upper and lower respiratory sp ecimens during the acute  phase of infection. The expected result is Negative. Fact Sheet for Patients:  StrictlyIdeas.no Fact Sheet for Healthcare Providers: BankingDealers.co.za This test is not yet approved or cleared by the [place] FDA and has been authorized for detection and/or diagnosis of SARS-CoV-2 by FDA under an Emergency Use Authorization (EUA).  This EUA will remain in effect (meaning this test can be used) for the duration of the COVID-19 declaration under Section 564(b)(1) of the Act, 21 U.S.C. section 360bbb-3(b)(1), unless the authorization is terminated or revoked sooner. Performed at [facility], [address] [place], [place] [address]     Coagulation Studies: Recent Labs    06/18/19 1840  LABPROT 14.1  INR 1.1    Imaging: Ct Head Wo Contrast  Result Date: 06/18/2019 CLINICAL DATA:  Weakness with left foot dragging EXAM: CT HEAD WITHOUT CONTRAST TECHNIQUE:  Contiguous axial images were obtained from the base of the skull through the vertex without intravenous contrast. COMPARISON:  None. FINDINGS: Brain: No evidence of acute infarction, hemorrhage, hydrocephalus, extra-axial collection or mass lesion/mass effect. Vascular: No hyperdense vessel or unexpected calcification. Skull: Normal. Negative for fracture or focal lesion. Sinuses/Orbits: No acute finding. IMPRESSION: Negative head CT. Electronically Signed   By: Monte Fantasia M.D.   On: 06/18/2019 07:15   Mr Brain Wo Contrast  Result Date: 06/18/2019 CLINICAL DATA:  Left lower extremity weakness. EXAM: MRI HEAD WITHOUT CONTRAST TECHNIQUE: Multiplanar, multiecho pulse sequences of the brain and surrounding structures were obtained without intravenous contrast. COMPARISON:  Head CT 06/18/2019 FINDINGS: Brain: There is an acute right lateral lenticulostriate territory infarct involving the posterior lentiform nucleus, corona radiata, and caudate body. T2 hyperintensities elsewhere in the cerebral white matter bilaterally are nonspecific but compatible with mild chronic small vessel ischemic disease. No intracranial hemorrhage, mass, midline shift, or extra-axial fluid collection is identified. The ventricles and sulci are within normal limits for age. Vascular: Major intracranial vascular flow voids are preserved. Skull and upper cervical spine: Unremarkable bone marrow signal. Sinuses/Orbits: Bilateral cataract extraction. Minimal  bilateral ethmoid air cell mucosal thickening. Clear mastoid air cells. Other: None. IMPRESSION: 1. Acute right basal ganglia region infarct. 2. Mild chronic small vessel ischemic disease. Electronically Signed   By: Logan Bores M.D.   On: 06/18/2019 10:46   US Carotid Bilateral (at [facility] Only)  Result Date: 06/18/2019 CLINICAL DATA:  78 year old male with a history of TIA EXAM: BILATERAL CAROTID DUPLEX ULTRASOUND TECHNIQUE: Pearline Cables scale imaging, color Doppler and duplex  ultrasound were performed of bilateral carotid and vertebral arteries in the neck. COMPARISON:  None. FINDINGS: Criteria: Quantification of carotid stenosis is based on velocity parameters that correlate the residual internal carotid diameter with NASCET-based stenosis levels, using the diameter of the distal internal carotid lumen as the denominator for stenosis measurement. The following velocity measurements were obtained: RIGHT ICA:  Systolic 88 cm/sec, Diastolic 11 cm/sec CCA:  123456 cm/sec SYSTOLIC ICA/CCA RATIO:  0.8 ECA:  84 cm/sec LEFT ICA:  Systolic 73 cm/sec, Diastolic 13 cm/sec CCA:  123456 cm/sec SYSTOLIC ICA/CCA RATIO:  0.7 ECA:  106 cm/sec Right Brachial SBP: Not acquired Left Brachial SBP: Not acquired RIGHT CAROTID ARTERY: No significant calcified disease of the right common carotid artery. Intermediate waveform maintained. Heterogeneous plaque without significant calcifications at the right carotid bifurcation. Low resistance waveform of the right ICA. No significant tortuosity. RIGHT VERTEBRAL ARTERY: Antegrade flow with low resistance waveform. LEFT CAROTID ARTERY: No significant calcified disease of the left common carotid artery. Intermediate waveform maintained. Heterogeneous plaque at the left carotid bifurcation without significant calcifications. Low resistance waveform of the left ICA. LEFT VERTEBRAL ARTERY:  Antegrade flow with low resistance waveform. IMPRESSION: Color duplex indicates minimal heterogeneous plaque, with no hemodynamically significant stenosis by duplex criteria in the extracranial cerebrovascular circulation. Signed, Dulcy Fanny. Dellia Nims, RPVI [facility] Radiology Electronically Signed   By: Corrie Mckusick D.O.   On: 06/18/2019 11:11    Medications:  I have reviewed the patient's current medications. Scheduled: . aspirin  300 mg Rectal Daily   Or  . aspirin  325 mg Oral Daily  . atorvastatin  40 mg Oral q1800  .  enoxaparin (LOVENOX) injection  40 mg Subcutaneous Q24H    Assessment/Plan: 78 y.o. male with a history of HLD presenting with left sided weakness.  MRI of the brain reviewed and shows an acute right BG infarct.  Etiology likely small vessel disease.  Carotid dopplers show no evidence of hemodynamically significant stenosis.  Echocardiogram with no cardiac source of emboli noted and EF of 60-65%.  A1c 6.0, LDL 120.  Patient on ASA prior to admission.  Is not agreeable at this time to medication changes.    Recommendations: 1. Statin for lipid management with target LDL<70. 2. Prophylactic therapy-Dual antiplatelet therapy with ASA 81mg  and Plavix 75mg  for three weeks with change to Plavix 75mg  daily alone as monotherapy after that time. 6. Follow up with neurology on an outpatient basis   LOS: 1 day   Alexis Goodell, MD Neurology [phone] 06/19/2019  11:10 AM

## 2019-06-20 ENCOUNTER — Telehealth: Payer: Self-pay

## 2019-06-20 NOTE — Telephone Encounter (Signed)
Transition Care Management Follow-up Telephone Call  Please review patient concern section. thank you   Date discharged? 06/19/2019   How have you been since you were released from the hospital? Spoke with patient. He has no numbness. Has a headache-comes and goes-he took Excedrin for this yesterday and it helped. Patient states he was told he had a stroke. Patient states his speech seems to be ok. He is walking ok-a little slower than usual. He is able to use his arms ok.  Do you understand why you were in the hospital? yes  Do you understand the discharge instructions? yes   Where were you discharged to? Home with his wife   Items Reviewed:  Medications reviewed: yes  Allergies reviewed: yes  Dietary changes reviewed: yes  Referrals reviewed: yes   Functional Questionnaire:   Activities of Daily Living (ADLs):   He states they are independent in the following: ambulation, dressing, hygiene, grooming, toileting, bathing, fixing food and medication. States they require assistance with the following: none   Any transportation issues/concerns?: no   Any patient concerns? Is it ok to take Excedrin for his headache?    Confirmed importance and date/time of follow-up visits scheduled yes  Provider Appointment booked with Dr Lorelei Pont on 06/23/2019  Confirmed with patient if condition begins to worsen call PCP or go to the ER.  Patient was given the office number and encouraged to call back with question or concerns.  :yes

## 2019-06-23 ENCOUNTER — Encounter: Payer: Self-pay | Admitting: Family Medicine

## 2019-06-23 ENCOUNTER — Ambulatory Visit: Payer: Medicare Other | Admitting: Family Medicine

## 2019-06-23 ENCOUNTER — Ambulatory Visit (INDEPENDENT_AMBULATORY_CARE_PROVIDER_SITE_OTHER): Payer: Medicare Other | Admitting: Family Medicine

## 2019-06-23 ENCOUNTER — Other Ambulatory Visit: Payer: Self-pay

## 2019-06-23 VITALS — BP 140/80 | HR 79 | Temp 98.4°F | Ht 68.25 in | Wt 175.5 lb

## 2019-06-23 DIAGNOSIS — Z91199 Patient's noncompliance with other medical treatment and regimen due to unspecified reason: Secondary | ICD-10-CM

## 2019-06-23 DIAGNOSIS — I69393 Ataxia following cerebral infarction: Secondary | ICD-10-CM | POA: Diagnosis not present

## 2019-06-23 DIAGNOSIS — E785 Hyperlipidemia, unspecified: Secondary | ICD-10-CM

## 2019-06-23 DIAGNOSIS — I6381 Other cerebral infarction due to occlusion or stenosis of small artery: Secondary | ICD-10-CM | POA: Insufficient documentation

## 2019-06-23 DIAGNOSIS — I639 Cerebral infarction, unspecified: Secondary | ICD-10-CM

## 2019-06-23 DIAGNOSIS — Z9119 Patient's noncompliance with other medical treatment and regimen: Secondary | ICD-10-CM

## 2019-06-23 HISTORY — DX: Other cerebral infarction due to occlusion or stenosis of small artery: I63.81

## 2019-06-23 NOTE — Progress Notes (Signed)
Scott Saez T. Scott Hindley, MD Primary Care and [place] at [facility] [place], [address] Phone: [phone]   FAX: [phone]  [name] - 78 y.o. male   MRN [id number]   Date of Birth: [birth year]  Visit Date: 06/23/2019   PCP: Owens Loffler, MD   Referred by: Owens Loffler, MD  Chief Complaint  Patient presents with   Hospitalization Follow-up    CVA   Subjective:   Scott [name] is a 78 y.o. 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  His wife is here for collaborative history.  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  He is still having some difficulty with his balance particular with walking.  At this point he is in denial regarding his current condition and does not really want to take any of his medication.  Echo normal Statin ASA 81 mg and plavix for 3 weeks, then Plavix alone  Outpatient PT F/u Dr. Manuella Ghazi in 1 month  Night leg was dragging. 60 push-ups after.   Does not want to take his mediation.  On 1 foot Heel to toe Tandem     Past Medical History, Surgical History, Social History, Family History, Problem List, Medications, and Allergies have been reviewed and updated if relevant.  Patient Active Problem List   Diagnosis Date Noted   Left leg numbness 06/18/2019   Unstable angina (HCC)    Prostate cancer, in remission    Right bundle branch block    AV  BLOCK, 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   Hyperlipidemia LDL goal <100 04/28/2010   ALLERGIC RHINITIS 04/28/2010   GERD 04/28/2010   Erectile dysfunction following radical prostatectomy 04/28/2010    Past Medical History:  Diagnosis Date   Allergic rhinitis due to pollen    Arthritis    GERD (gastroesophageal reflux disease)    Hyperlipidemia    Prostate cancer ([place])    cancer   Right bundle branch block    RBBB    Past Surgical History:  Procedure Laterality Date   APPENDECTOMY     HERNIA REPAIR  85,03   rt and lt ing hernia   KNEE ARTHROSCOPY  2012   left   LEFT HEART CATH AND CORONARY ANGIOGRAPHY N/A 05/30/2018   Procedure: LEFT HEART CATH AND CORONARY ANGIOGRAPHY;  Surgeon: Wellington Hampshire, MD;  Location: [place] [facility];  Service: Cardiovascular;  Laterality: N/A;   prostatectomy  1998   ROTATOR CUFF REPAIR     right   SHOULDER ARTHROSCOPY Right 01/29/2013   Procedure: RIGHT ARTHROSCOPY SHOULDER WITH DEBRIDEMENT, POSSIBLE ROTATOR CUFF REPAIR;  Surgeon: Kerin Salen, MD;  Location: [place];  Service: Orthopedics;  Laterality: Right;   TONSILLECTOMY     TOTAL HIP ARTHROPLASTY  2007   right     Social History   Socioeconomic History   Marital status: Married    Spouse name: Not on file  Number of children: Not on file   Years of education: Not on file   Highest education level: Not on file  Occupational History   Occupation: Herbalist: RETIRED  Social Designer, fashion/clothing strain: Not on file   Food insecurity    Worry: Not on file    Inability: Not on file   Transportation needs    Medical: Not on file    Non-medical: Not on file  Tobacco Use   Smoking status: Never Smoker   Smokeless tobacco: Never Used  Substance and Sexual Activity   Alcohol use: No   Drug use: No   Sexual activity: Not on file  Lifestyle   Physical activity    Days per week: Not on file    Minutes per session:  Not on file   Stress: Not on file  Relationships   Social connections    Talks on phone: Not on file    Gets together: Not on file    Attends religious service: Not on file    Active member of club or organization: Not on file    Attends meetings of clubs or organizations: Not on file    Relationship status: Not on file   Intimate partner violence    Fear of current or ex partner: Not on file    Emotionally abused: Not on file    Physically abused: Not on file    Forced sexual activity: Not on file  Other Topics Concern   Not on file  Social History Narrative   Former competitive bodybuilder, Scott [name], all natural   [employer], [employer], multiple years    Family History  Problem Relation Age of Onset   Cancer Father        prostate   Cancer Brother        prostate   Cancer Brother        prostate    No Known Allergies  Medication list reviewed and updated in full in [place].   GEN: No acute illnesses, no fevers, chills. GI: No n/v/d, eating normally Pulm: No SOB Interactive and getting along well at home. Otherwise, the pertinent positives and negatives are listed above and in the HPI, otherwise a full review of systems has been reviewed and is negative unless noted positive.   Objective:   BP 140/80    Pulse 79    Temp 98.4 F (36.9 C) (Temporal)    Ht 5' 8.25" (1.734 m)    Wt 175 lb 8 oz (79.6 kg)    SpO2 97%    BMI 26.49 kg/m   GEN: WDWN, NAD, Non-toxic, A & O x 3 HEENT: Atraumatic, Normocephalic. Neck supple. No masses, No LAD. Ears and Nose: No external deformity. CV: RRR, No M/G/R. No JVD. No thrill. No extra heart sounds. PULM: CTA B, no wheezes, crackles, rhonchi. No retractions. No resp. distress. No accessory muscle use. PSYCH: Normally interactive. Conversant. Not depressed or anxious appearing.  Calm demeanor.   Neuro: CN 2-12 grossly intact. PERRLA. EOMI. Sensation intact throughout. Str 5/5 all  extremities. DTR 2+. No clonus. A and o x 4. Romberg neg. Finger nose neg. significant difficulty with standing on 1 foot as well as tandem, this is with eyes open and worse with eyes closed.  Heel-to-toe walking, significant difficulty and loses his balance very easily.    Laboratory and Imaging Data: Ct Head Wo [name]  Result Date: 06/18/2019 CLINICAL DATA:  Weakness with left foot dragging EXAM: CT HEAD WITHOUT [name] TECHNIQUE: Contiguous axial images were obtained from the base of the skull through the vertex without intravenous [name]. COMPARISON:  None. FINDINGS: Brain: No evidence of acute infarction, hemorrhage, hydrocephalus, extra-axial collection or mass lesion/mass effect. Vascular: No hyperdense vessel or unexpected calcification. Skull: Normal. Negative for fracture or focal lesion. Sinuses/Orbits: No acute finding. IMPRESSION: Negative head CT. Electronically Signed   By: Monte Fantasia M.D.   On: 06/18/2019 07:15   Scott [name]  Result Date: 06/18/2019 CLINICAL DATA:  Left lower extremity weakness. EXAM: MRI HEAD WITHOUT [name] TECHNIQUE: Multiplanar, multiecho pulse sequences of the brain and surrounding structures were obtained without intravenous [name]. COMPARISON:  Head CT 06/18/2019 FINDINGS: Brain: There is an acute right lateral lenticulostriate territory infarct involving the posterior lentiform nucleus, corona radiata, and caudate body. T2 hyperintensities elsewhere in the cerebral white matter bilaterally are nonspecific but compatible with mild chronic small vessel ischemic disease. No intracranial hemorrhage, mass, midline shift, or extra-axial fluid collection is identified. The ventricles and sulci are within normal limits for age. Vascular: Major intracranial vascular flow voids are preserved. Skull and upper cervical spine: Unremarkable bone marrow signal. Sinuses/Orbits: Bilateral cataract extraction. Minimal bilateral ethmoid air cell mucosal  thickening. Clear mastoid air cells. Other: None. IMPRESSION: 1. Acute right basal ganglia region infarct. 2. Mild chronic small vessel ischemic disease. Electronically Signed   By: Logan Bores M.D.   On: 06/18/2019 10:46   Scott [name] Bilateral (at [facility] Only)  Result Date: 06/18/2019 CLINICAL DATA:  78 year old male with a history of TIA EXAM: BILATERAL [name] DUPLEX ULTRASOUND TECHNIQUE: Pearline Cables scale imaging, color Doppler and duplex ultrasound were performed of bilateral [name] and vertebral arteries in the neck. COMPARISON:  None. FINDINGS: Criteria: Quantification of [name] stenosis is based on velocity parameters that correlate the residual internal [name] diameter with NASCET-based stenosis levels, using the diameter of the distal internal [name] lumen as the denominator for stenosis measurement. The following velocity measurements were obtained: RIGHT ICA:  Systolic 88 cm/sec, Diastolic 11 cm/sec CCA:  123456 cm/sec SYSTOLIC ICA/CCA RATIO:  0.8 ECA:  84 cm/sec LEFT ICA:  Systolic 73 cm/sec, Diastolic 13 cm/sec CCA:  123456 cm/sec SYSTOLIC ICA/CCA RATIO:  0.7 ECA:  106 cm/sec Right Brachial SBP: Not acquired Left Brachial SBP: Not acquired RIGHT [name] ARTERY: No significant calcified disease of the right common [name] artery. Intermediate waveform maintained. Heterogeneous plaque without significant calcifications at the right [name] bifurcation. Low resistance waveform of the right ICA. No significant tortuosity. RIGHT VERTEBRAL ARTERY: Antegrade flow with low resistance waveform. LEFT [name] ARTERY: No significant calcified disease of the left common [name] artery. Intermediate waveform maintained. Heterogeneous plaque at the left [name] bifurcation without significant calcifications. Low resistance waveform of the left ICA. LEFT VERTEBRAL ARTERY:  Antegrade flow with low resistance waveform. IMPRESSION: Color duplex indicates minimal heterogeneous plaque, with no hemodynamically significant  stenosis by duplex criteria in the extracranial cerebrovascular circulation. Signed, Dulcy Fanny. Dellia Nims, RPVI [name] Electronically Signed   By: Corrie Mckusick D.O.   On: 06/18/2019 11:11    Recent Results (from the past 2160 hour(s))  Ethanol     Status: None   Collection Time: 06/18/19  6:42 AM  Result Value Ref Range   Alcohol, Ethyl (B) <10 <10 mg/dL    Comment: (NOTE) Lowest detectable limit for serum alcohol is 10 mg/dL. For medical purposes only. Performed at [facility],  [place], [place] [address]   Comprehensive metabolic panel     Status: Abnormal   Collection Time: 06/18/19  6:42 AM  Result Value Ref Range   Sodium 140 135 - 145 mmol/L   Potassium 4.1 3.5 - 5.1 mmol/L   Chloride 109 98 - 111 mmol/L   CO2 21 (L) 22 - 32 mmol/L   Glucose, Bld 140 (H) 70 - 99 mg/dL   BUN 24 (H) 8 - 23 mg/dL   Creatinine, Ser 0.80 0.61 - 1.24 mg/dL   Calcium 9.6 8.9 - 10.3 mg/dL   Total Protein 7.0 6.5 - 8.1 g/dL   Albumin 4.2 3.5 - 5.0 g/dL   AST 20 15 - 41 U/L   ALT 19 0 - 44 U/L   Alkaline Phosphatase 67 38 - 126 U/L   Total Bilirubin 0.6 0.3 - 1.2 mg/dL   GFR calc non Af Amer >60 >60 mL/min   GFR calc Af Amer >60 >60 mL/min   Anion gap 10 5 - 15    Comment: Performed at [facility], [address] [place], [place] [address]  CBC WITH DIFFERENTIAL     Status: Abnormal   Collection Time: 06/18/19  6:42 AM  Result Value Ref Range   WBC 12.2 (H) 4.0 - 10.5 K/uL   RBC 5.05 4.22 - 5.81 MIL/uL   Hemoglobin 14.9 13.0 - 17.0 g/dL   HCT 44.4 39.0 - 52.0 %   MCV 87.9 80.0 - 100.0 fL   MCH 29.5 26.0 - 34.0 pg   MCHC 33.6 30.0 - 36.0 g/dL   RDW 14.0 11.5 - 15.5 %   Platelets 195 150 - 400 K/uL   nRBC 0.0 0.0 - 0.2 %   Neutrophils Relative % 90 %   Neutro Abs 11.0 (H) 1.7 - 7.7 K/uL   Lymphocytes Relative 6 %   Lymphs Abs 0.7 0.7 - 4.0 K/uL   Monocytes Relative 4 %   Monocytes  Absolute 0.5 0.1 - 1.0 K/uL   Eosinophils Relative 0 %   Eosinophils Absolute 0.0 0.0 - 0.5 K/uL   Basophils Relative 0 %   Basophils Absolute 0.0 0.0 - 0.1 K/uL   Immature Granulocytes 0 %   Abs Immature Granulocytes 0.05 0.00 - 0.07 K/uL    Comment: Performed at [facility], [address] [place], [place] [address]  Hemoglobin A1c     Status: Abnormal   Collection Time: 06/18/19  6:42 AM  Result Value Ref Range   Hgb A1c MFr Bld 6.0 (H) 4.8 - 5.6 %    Comment: (NOTE) Pre diabetes:          5.7%-6.4% Diabetes:              >6.4% Glycemic control for   <7.0% adults with diabetes    Mean Plasma Glucose 125.5 mg/dL    Comment: Performed at [facility], [place] [address], [place], [place] [address]  Lipid panel     Status: Abnormal   Collection Time: 06/18/19  6:42 AM  Result Value Ref Range   Cholesterol 169 0 - 200 mg/dL   Triglycerides 58 <150 mg/dL   HDL 37 (L) >40 mg/dL   Total CHOL/HDL Ratio 4.6 RATIO   VLDL 12 0 - 40 mg/dL   LDL Cholesterol 120 (H) 0 - 99 mg/dL    Comment:        Total Cholesterol/HDL:CHD Risk Coronary Heart Disease Risk Table  Men   Women  1/2 Average Risk   3.4   3.3  Average Risk       5.0   4.4  2 X Average Risk   9.6   7.1  3 X Average Risk  23.4   11.0        Use the calculated Patient Ratio above and the CHD Risk Table to determine the patient's CHD Risk.        ATP III CLASSIFICATION (LDL):  <100     mg/dL   Optimal  100-129  mg/dL   Near or Above                    Optimal  130-159  mg/dL   Borderline  160-189  mg/dL   High  >190     mg/dL   Very High Performed at [facility], [place], [place] [address]   SARS Coronavirus 2 by RT PCR (hospital order, performed in [facility] hospital lab) Nasopharyngeal Nasopharyngeal Swab     Status: None   Collection Time: 06/18/19  6:43 AM   Specimen: Nasopharyngeal Swab  Result Value Ref Range   SARS Coronavirus 2 NEGATIVE NEGATIVE      Comment: (NOTE) If result is NEGATIVE SARS-CoV-2 target nucleic acids are NOT DETECTED. The SARS-CoV-2 RNA is generally detectable in upper and lower  respiratory specimens during the acute phase of infection. The lowest  concentration of SARS-CoV-2 viral copies this assay can detect is 250  copies / mL. A negative result does not preclude SARS-CoV-2 infection  and should not be used as the sole basis for treatment or other  patient management decisions.  A negative result may occur with  improper specimen collection / handling, submission of specimen other  than nasopharyngeal swab, presence of viral mutation(s) within the  areas targeted by this assay, and inadequate number of viral copies  (<250 copies / mL). A negative result must be combined with clinical  observations, patient history, and epidemiological information. If result is POSITIVE SARS-CoV-2 target nucleic acids are DETECTED. The SARS-CoV-2 RNA is generally detectable in upper and lower  respiratory specimens dur ing the acute phase of infection.  Positive  results are indicative of active infection with SARS-CoV-2.  Clinical  correlation with patient history and other diagnostic information is  necessary to determine patient infection status.  Positive results do  not rule out bacterial infection or co-infection with other viruses. If result is PRESUMPTIVE POSTIVE SARS-CoV-2 nucleic acids MAY BE PRESENT.   A presumptive positive result was obtained on the submitted specimen  and confirmed on repeat testing.  While 2019 novel coronavirus  (SARS-CoV-2) nucleic acids may be present in the submitted sample  additional confirmatory testing may be necessary for epidemiological  and / or clinical management purposes  to differentiate between  SARS-CoV-2 and other Sarbecovirus currently known to infect humans.  If clinically indicated additional testing with an alternate test  methodology [phone]) is advised. The  SARS-CoV-2 RNA is generally  detectable in upper and lower respiratory sp ecimens during the acute  phase of infection. The expected result is Negative. Fact Sheet for Patients:  StrictlyIdeas.no Fact Sheet for Healthcare Providers: BankingDealers.co.za This test is not yet approved or cleared by the [place] FDA and has been authorized for detection and/or diagnosis of SARS-CoV-2 by FDA under an Emergency Use Authorization (EUA).  This EUA will remain in effect (meaning this test can be used) for the duration  of the COVID-19 declaration under Section 564(b)(1) of the Act, 21 U.S.C. section 360bbb-3(b)(1), unless the authorization is terminated or revoked sooner. Performed at [facility], [address] [place], [place] [address]   ECHOCARDIOGRAM COMPLETE     Status: None   Collection Time: 06/18/19  2:04 PM  Result Value Ref Range   Weight 2,800 oz   Height 68.5 in   BP 125/84 mmHg  Protime-INR     Status: None   Collection Time: 06/18/19  6:40 PM  Result Value Ref Range   Prothrombin Time 14.1 11.4 - 15.2 seconds   INR 1.1 0.8 - 1.2    Comment: (NOTE) INR goal varies based on device and disease states. Performed at [facility], [address] [place], [place] [address]   APTT     Status: None   Collection Time: 06/18/19  6:40 PM  Result Value Ref Range   aPTT 29 24 - 36 seconds    Comment: Performed at [name], [address] [place], [place] [address]  Urine Drug Screen, Qualitative     Status: Abnormal   Collection Time: 06/18/19  9:13 PM  Result Value Ref Range   Tricyclic, Ur Screen NONE DETECTED NONE DETECTED   Amphetamines, Ur Screen NONE DETECTED NONE DETECTED   MDMA (Ecstasy)Ur Screen NONE DETECTED NONE DETECTED   Cocaine Metabolite,Ur [place] NONE DETECTED NONE DETECTED   Opiate, Ur Screen NONE DETECTED NONE DETECTED   Phencyclidine (PCP) Ur S NONE DETECTED NONE DETECTED    Cannabinoid 50 Ng, Ur  NONE DETECTED NONE DETECTED   Barbiturates, Ur Screen NONE DETECTED NONE DETECTED   Benzodiazepine, Ur Scrn POSITIVE (A) NONE DETECTED   Methadone Scn, Ur NONE DETECTED NONE DETECTED    Comment: (NOTE) Tricyclics + metabolites, urine    Cutoff 1000 ng/mL Amphetamines + metabolites, urine  Cutoff 1000 ng/mL MDMA (Ecstasy), urine              Cutoff 500 ng/mL Cocaine Metabolite, urine          Cutoff 300 ng/mL Opiate + metabolites, urine        Cutoff 300 ng/mL Phencyclidine (PCP), urine         Cutoff 25 ng/mL Cannabinoid, urine                 Cutoff 50 ng/mL Barbiturates + metabolites, urine  Cutoff 200 ng/mL Benzodiazepine, urine              Cutoff 200 ng/mL Methadone, urine                   Cutoff 300 ng/mL The urine drug screen provides only a preliminary, unconfirmed analytical test result and should not be used for non-medical purposes. Clinical consideration and professional judgment should be applied to any positive drug screen result due to possible interfering substances. A more specific alternate chemical method must be used in order to obtain a confirmed analytical result. Gas chromatography / mass spectrometry (GC/MS) is the preferred confirmat ory method. Performed at [facility], [address] [place], [place] [address]   Urinalysis, Routine w reflex microscopic     Status: Abnormal   Collection Time: 06/18/19  9:13 PM  Result Value Ref Range   Color, Urine YELLOW (A) YELLOW   APPearance CLEAR (A) CLEAR   Specific Gravity, Urine 1.023 1.005 - 1.030   pH 6.0 5.0 - 8.0   Glucose, UA NEGATIVE NEGATIVE mg/dL   Hgb urine  dipstick NEGATIVE NEGATIVE   Bilirubin Urine NEGATIVE NEGATIVE   Ketones, ur NEGATIVE NEGATIVE mg/dL   Protein, ur NEGATIVE NEGATIVE mg/dL   Nitrite NEGATIVE NEGATIVE   Leukocytes,Ua NEGATIVE NEGATIVE    Comment: Performed at [facility], [address] [place], [place] [address]  CBC     Status:  Abnormal   Collection Time: 06/19/19  9:34 AM  Result Value Ref Range   WBC 11.4 (H) 4.0 - 10.5 K/uL   RBC 5.11 4.22 - 5.81 MIL/uL   Hemoglobin 15.1 13.0 - 17.0 g/dL   HCT 45.6 39.0 - 52.0 %   MCV 89.2 80.0 - 100.0 fL   MCH 29.5 26.0 - 34.0 pg   MCHC 33.1 30.0 - 36.0 g/dL   RDW 14.6 11.5 - 15.5 %   Platelets 203 150 - 400 K/uL   nRBC 0.0 0.0 - 0.2 %    Comment: Performed at [facility], [address], [place], [place] [address]    Assessment and Plan:     ICD-10-CM   1. 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)  I63.9   2. 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 Hyperlipidemia LDL goal <100  E78.5   4. Medically noncompliant  Z91.19    The patient is in denial that he actually been had a stroke.  I reviewed all of his MRI records with him and his wife.  He is reluctant to take any of his prescribed medications.  He thinks that his cholesterol medicine is making him feel bad.  He is reluctant to take his Plavix, worried about bruising.  I talked about all of this with him and importance of his medication protocol in the setting of CVA, and I did this about 10 times with the patient and his wife.  I think that at this point this is good to be his primary obstacle in future care.  Patient Instructions  No excedrin migraine  YOU CAN TAKE EXCEDRIN TENSION HEADACHE   For 3 weeks take your baby aspirin 81 mg and plavix. Then only take your plavix  Do take your cholesterol medication.   Follow-up with Dr. Manuella Ghazi the Neurologist    Follow-up: Neurology  No orders of the defined types were placed in this encounter.  No orders of the defined types were placed in this encounter.   Signed,  Maud Deed. Scott Wormley, MD   Outpatient Encounter Medications as of 06/23/2019  Medication Sig   acetaminophen (TYLENOL) 325 MG tablet Take 2 tablets (650 mg total) by mouth every 6 (six) hours as needed for mild pain (or temp > 37.5 C  (99.5 F)).   aspirin 81 MG tablet Take 1 tablet (81 mg total) by mouth daily. Take aspirin 81 mg once daily by mouth for 3 weeks(21 days) and then stop   atorvastatin (LIPITOR) 40 MG tablet Take 1 tablet (40 mg total) by mouth daily at 6 PM.   clopidogrel (PLAVIX) 75 MG tablet Take 1 tablet (75 mg total) by mouth daily.   cyanocobalamin 2000 MCG tablet Take 2,000 mcg by mouth daily.   omeprazole (PRILOSEC) 40 MG capsule Take 1 capsule (40 mg total) by mouth daily.   Red Yeast Rice 600 MG CAPS Take 2,400 capsules by mouth daily.    vitamin E (VITAMIN E) 400 UNIT capsule Take 400 Units by mouth daily.   [DISCONTINUED] guaiFENesin-dextromethorphan (ROBITUSSIN DM) 100-10 MG/5ML syrup Take 5 mLs by mouth every 6 (six) hours  as needed for cough.   [DISCONTINUED] MOBIC 15 MG tablet Take 15 mg by mouth daily as needed.   No facility-administered encounter medications on file as of 06/23/2019.

## 2019-06-23 NOTE — Patient Instructions (Addendum)
No excedrin migraine  YOU CAN TAKE EXCEDRIN TENSION HEADACHE   For 3 weeks take your baby aspirin 81 mg and plavix. Then only take your plavix  Do take your cholesterol medication.   Follow-up with Dr. Manuella Ghazi the Neurologist

## 2019-07-10 ENCOUNTER — Other Ambulatory Visit: Payer: Self-pay | Admitting: Family Medicine

## 2019-07-10 DIAGNOSIS — K219 Gastro-esophageal reflux disease without esophagitis: Secondary | ICD-10-CM

## 2019-07-22 ENCOUNTER — Other Ambulatory Visit: Payer: Self-pay | Admitting: Family Medicine

## 2019-07-30 ENCOUNTER — Ambulatory Visit (INDEPENDENT_AMBULATORY_CARE_PROVIDER_SITE_OTHER): Payer: Medicare Other | Admitting: Family Medicine

## 2019-07-30 ENCOUNTER — Encounter: Payer: Self-pay | Admitting: Family Medicine

## 2019-07-30 ENCOUNTER — Other Ambulatory Visit: Payer: Self-pay

## 2019-07-30 VITALS — BP 134/78 | HR 71 | Temp 98.7°F | Ht 68.25 in | Wt 175.8 lb

## 2019-07-30 DIAGNOSIS — H60591 Other noninfective acute otitis externa, right ear: Secondary | ICD-10-CM | POA: Diagnosis not present

## 2019-07-30 DIAGNOSIS — I639 Cerebral infarction, unspecified: Secondary | ICD-10-CM

## 2019-07-30 DIAGNOSIS — H6983 Other specified disorders of Eustachian tube, bilateral: Secondary | ICD-10-CM

## 2019-07-30 DIAGNOSIS — H938X1 Other specified disorders of right ear: Secondary | ICD-10-CM | POA: Diagnosis not present

## 2019-07-30 MED ORDER — OFLOXACIN 0.3 % OT SOLN: 10.0000 [drp] | Freq: Every day | OTIC | 0 refills | Status: DC

## 2019-07-30 MED ORDER — NEOMYCIN-POLYMYXIN-HC 3.5-10000-1 OT SOLN: 3.0000 [drp] | Freq: Four times a day (QID) | OTIC | 0 refills | Status: DC

## 2019-07-30 NOTE — Patient Instructions (Signed)
Eustachian Tube Dysfunction: There is a tube that connects between the sinuses and behind the ear called the "eustachian tube." Sometimes when you have allergies, a cold, or nasal congestion for any reason this tube can get blocked and pressure cannot equalize in your ears. (Like if you swim in deep water) This can also trap fluid behind the ear and give you a full, pressure-like sensation that is uncomfortable, but it is not an ear infection.  Recommendations: Afrin Nasal Spray: 2 sprays twice a day for a maximum of 3-4 days (longer than this and your nose gets addicted, and you have rebound swelling that makes it worse.) Anti-Histamine: Allegra, Zyrtec, or Claritin. All over the counter now and once a day.  Nasal steroid. Nasacort is over the counter now. About 10 prescription ones exist.  If you develop fever > 100.4, then things can change fluid behind the ear does increase your risk of developing an ear infection.

## 2019-07-30 NOTE — Progress Notes (Signed)
Scott [name] T. Scott Butkiewicz, MD Primary Care and [facility] at [facility] [place], [address] Phone: [phone]  FAX: [phone]  Scott [name] - 78 y.o. male  MRN [id number]  Date of Birth: [birth year]  Visit Date: 07/30/2019  PCP: Scott Loffler, MD  Referred by: Scott Loffler, MD  Chief Complaint  Patient presents with  . Ear Popping    Right    This visit occurred during the SARS-CoV-2 public health emergency.  Safety protocols were in place, including screening questions prior to the visit, additional usage of staff PPE, and extensive cleaning of exam room while observing appropriate contact time as indicated for disinfecting solutions.   Subjective:   Scott [name] is a 78 y.o. very pleasant male patient who presents with the following:  He presents with R sided ear popping.  He does have some significant itching in his right ear canal.  He does have some discomfort in the ear canal having quite a bit of popping in that ear.  He does have some pressure but not frank pain.  He otherwise is not feeling sick at all with no fever, respiratory symptoms or cough, nausea vomiting, diarrhea or any kind neurological complaints.  He did recently have a stroke and unfortunately stopped all of his posterior of medication including Plavix and he has been able to continue his Lipitor.  Never took post-stroke medicine. Stopped plavix.      Past Medical History, Surgical History, Social History, Family History, Problem List, Medications, and Allergies have been reviewed and updated if relevant.  Patient Active Problem List   Diagnosis Date Noted  . 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 ([place]) 06/23/2019    Priority: High  . Left leg numbness 06/18/2019  . Unstable angina ([place])   . Prostate cancer, in remission   . Right bundle branch block   . AV BLOCK, 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  . Hyperlipidemia LDL goal  <100 04/28/2010  . ALLERGIC RHINITIS 04/28/2010  . GERD 04/28/2010  . Erectile dysfunction following radical prostatectomy 04/28/2010    Past Medical History:  Diagnosis Date  . Allergic rhinitis due to pollen   . Arthritis   . 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 ([place]) 06/23/2019  . GERD (gastroesophageal reflux disease)   . Hyperlipidemia   . Prostate cancer ([place])    cancer  . Right bundle branch block    RBBB    Past Surgical History:  Procedure Laterality Date  . APPENDECTOMY    . HERNIA REPAIR  85,03   rt and lt ing hernia  . KNEE ARTHROSCOPY  2012   left  . LEFT HEART CATH AND CORONARY ANGIOGRAPHY N/A 05/30/2018   Procedure: LEFT HEART CATH AND CORONARY ANGIOGRAPHY;  Surgeon: Scott Hampshire, MD;  Location: [facility];  Service: Cardiovascular;  Laterality: N/A;  . prostatectomy  1998  . ROTATOR CUFF REPAIR     right  . SHOULDER ARTHROSCOPY Right 01/29/2013   Procedure: RIGHT ARTHROSCOPY SHOULDER WITH DEBRIDEMENT, POSSIBLE ROTATOR CUFF REPAIR;  Surgeon: Scott Salen, MD;  Location: [facility];  Service: Orthopedics;  Laterality: Right;  . TONSILLECTOMY    . TOTAL HIP ARTHROPLASTY  2007   right     Social History   Socioeconomic History  . Marital status: Married    Spouse name: Not on file  . Number of children: Not on file  . Years of education: Not on  file  . Highest education level: Not on file  Occupational History  . Occupation: Herbalist: RETIRED  Social Needs  . Financial resource strain: Not on file  . Food insecurity    Worry: Not on file    Inability: Not on file  . Transportation needs    Medical: Not on file    Non-medical: Not on file  Tobacco Use  . Smoking status: Never Smoker  . Smokeless tobacco: Never Used  Substance and Sexual Activity  . Alcohol use: No  . Drug use: No  . Sexual activity: Not on file  Lifestyle  . Physical activity    Days per week: Not on file     Minutes per session: Not on file  . Stress: Not on file  Relationships  . Social Herbalist on phone: Not on file    Gets together: Not on file    Attends religious service: Not on file    Active member of club or organization: Not on file    Attends meetings of clubs or organizations: Not on file    Relationship status: Not on file  . Intimate partner violence    Fear of current or ex partner: Not on file    Emotionally abused: Not on file    Physically abused: Not on file    Forced sexual activity: Not on file  Other Topics Concern  . Not on file  Social History Narrative   Former competitive bodybuilder, Geophysicist/field seismologist, all natural   [employer], [employer] [place], [employer], multiple years    Family History  Problem Relation Age of Onset  . Cancer Father        prostate  . Cancer Brother        prostate  . Cancer Brother        prostate    No Known Allergies  Medication list reviewed and updated in full in [place].   GEN: No acute illnesses, no fevers, chills. GI: No n/v/d, eating normally Pulm: No SOB Interactive and getting along well at home.  Otherwise, ROS is as per the HPI.  Objective:   BP 134/78   Pulse 71   Temp 98.7 F (37.1 C) (Temporal)   Ht 5' 8.25" (1.734 m)   Wt 175 lb 12 oz (79.7 kg)   SpO2 97%   BMI 26.53 kg/m   GEN: WDWN, NAD, Non-toxic, A & O x 3 HEENT: Atraumatic, Normocephalic. Neck supple. No masses, No LAD. Ears and Nose: No external deformity.  He does have quite a bit external inflammation in the canal on the right.  There is minimal wax..  There is some significant fluid behind the eardrum in both ears. CV: RRR, No M/G/R. No JVD. No thrill. No extra heart sounds. PULM: CTA B, no wheezes, crackles, rhonchi. No retractions. No resp. distress. No accessory muscle use. EXTR: No c/c/e NEURO Normal gait.  PSYCH: Normally interactive. Conversant. Not depressed or anxious appearing.  Calm demeanor.   Laboratory  and Imaging Data:  Assessment and Plan:     ICD-10-CM   1. Acute irritant otitis externa, right  H60.591   2. Ear popping, right  H93.8X1   3. ETD (Eustachian tube dysfunction), bilateral  H69.83    Significant irritated canal on the right consistent with otitis externa.  Due to cost I am going to change the patient to Floxin otic since he has to pay this completely out-of-pocket.  Also reviewed  with him eustachian tube disorder management.  Patient Instructions  Eustachian Tube Dysfunction: There is a tube that connects between the sinuses and behind the ear called the "eustachian tube." Sometimes when you have allergies, a cold, or nasal congestion for any reason this tube can get blocked and pressure cannot equalize in your ears. (Like if you swim in deep water) This can also trap fluid behind the ear and give you a full, pressure-like sensation that is uncomfortable, but it is not an ear infection.  Recommendations: Afrin Nasal Spray: 2 sprays twice a day for a maximum of 3-4 days (longer than this and your nose gets addicted, and you have rebound swelling that makes it worse.) Anti-Histamine: Allegra, Zyrtec, or Claritin. All over the counter now and once a day.  Nasal steroid. Nasacort is over the counter now. About 10 prescription ones exist.  If you develop fever > 100.4, then things can change fluid behind the ear does increase your risk of developing an ear infection.    Follow-up: No follow-ups on file.  Meds ordered this encounter  Medications  . DISCONTD: neomycin-polymyxin-hydrocortisone (CORTISPORIN) OTIC solution    Sig: Place 3 drops into the right ear 4 (four) times daily for 7 days.    Dispense:  4.2 mL    Refill:  0  . ofloxacin (FLOXIN) 0.3 % OTIC solution    Sig: Place 10 drops into the right ear daily.    Dispense:  10 mL    Refill:  0    IGNORE the last cortisporin otic script)   No orders of the defined types were placed in this encounter.   Signed,   Maud Deed. Berkeley Veldman, MD   Outpatient Encounter Medications as of 07/30/2019  Medication Sig  . acetaminophen (TYLENOL) 325 MG tablet Take 2 tablets (650 mg total) by mouth every 6 (six) hours as needed for mild pain (or temp > 37.5 C (99.5 F)).  Marland Kitchen aspirin 81 MG tablet Take 1 tablet (81 mg total) by mouth daily. Take aspirin 81 mg once daily by mouth for 3 weeks(21 days) and then stop  . atorvastatin (LIPITOR) 40 MG tablet TAKE 1 TABLET BY MOUTH DAILY AT 6PM  . cyanocobalamin 2000 MCG tablet Take 2,000 mcg by mouth daily.  Marland Kitchen omeprazole (PRILOSEC) 40 MG capsule Take 1 capsule (40 mg total) by mouth daily.  . Red Yeast Rice Extract (RED YEAST RICE PO) Take 900 mg by mouth every morning.  . vitamin E (VITAMIN E) 400 UNIT capsule Take 400 Units by mouth daily.  Marland Kitchen ofloxacin (FLOXIN) 0.3 % OTIC solution Place 10 drops into the right ear daily.  . [DISCONTINUED] clopidogrel (PLAVIX) 75 MG tablet Take 1 tablet (75 mg total) by mouth daily.  . [DISCONTINUED] neomycin-polymyxin-hydrocortisone (CORTISPORIN) OTIC solution Place 3 drops into the right ear 4 (four) times daily for 7 days.  . [DISCONTINUED] Red Yeast Rice 600 MG CAPS Take 2,400 capsules by mouth daily.    No facility-administered encounter medications on file as of 07/30/2019.

## 2019-08-27 DIAGNOSIS — Z20828 Contact with and (suspected) exposure to other viral communicable diseases: Secondary | ICD-10-CM | POA: Diagnosis not present

## 2019-10-13 ENCOUNTER — Other Ambulatory Visit: Payer: Self-pay | Admitting: Family Medicine

## 2019-10-13 DIAGNOSIS — K219 Gastro-esophageal reflux disease without esophagitis: Secondary | ICD-10-CM

## 2019-10-30 DIAGNOSIS — M19011 Primary osteoarthritis, right shoulder: Secondary | ICD-10-CM | POA: Diagnosis not present

## 2019-10-30 DIAGNOSIS — Z967 Presence of other bone and tendon implants: Secondary | ICD-10-CM | POA: Diagnosis not present

## 2019-10-30 DIAGNOSIS — S46002S Unspecified injury of muscle(s) and tendon(s) of the rotator cuff of left shoulder, sequela: Secondary | ICD-10-CM | POA: Diagnosis not present

## 2019-10-30 DIAGNOSIS — M19012 Primary osteoarthritis, left shoulder: Secondary | ICD-10-CM | POA: Diagnosis not present

## 2019-10-30 DIAGNOSIS — S46001S Unspecified injury of muscle(s) and tendon(s) of the rotator cuff of right shoulder, sequela: Secondary | ICD-10-CM | POA: Diagnosis not present

## 2019-10-30 DIAGNOSIS — M19111 Post-traumatic osteoarthritis, right shoulder: Secondary | ICD-10-CM | POA: Diagnosis not present

## 2019-10-30 DIAGNOSIS — M19112 Post-traumatic osteoarthritis, left shoulder: Secondary | ICD-10-CM | POA: Diagnosis not present

## 2019-12-11 DIAGNOSIS — L821 Other seborrheic keratosis: Secondary | ICD-10-CM | POA: Diagnosis not present

## 2019-12-11 DIAGNOSIS — Z85828 Personal history of other malignant neoplasm of skin: Secondary | ICD-10-CM | POA: Diagnosis not present

## 2019-12-11 DIAGNOSIS — D1801 Hemangioma of skin and subcutaneous tissue: Secondary | ICD-10-CM | POA: Diagnosis not present

## 2019-12-11 DIAGNOSIS — D225 Melanocytic nevi of trunk: Secondary | ICD-10-CM | POA: Diagnosis not present

## 2019-12-11 DIAGNOSIS — L812 Freckles: Secondary | ICD-10-CM | POA: Diagnosis not present

## 2019-12-20 HISTORY — PX: ROBOTIC ASSISTED LAPAROSCOPIC CHOLECYSTECTOMY: SHX6521

## 2020-01-02 ENCOUNTER — Inpatient Hospital Stay
Admission: EM | Admit: 2020-01-02 | Discharge: 2020-01-06 | DRG: 418 | Disposition: A | Discharge status: Home / Self Care | Payer: Medicare Other | Attending: Internal Medicine | Admitting: Internal Medicine

## 2020-01-02 ENCOUNTER — Encounter: Payer: Self-pay | Admitting: Emergency Medicine

## 2020-01-02 ENCOUNTER — Other Ambulatory Visit: Payer: Self-pay

## 2020-01-02 DIAGNOSIS — Z8042 Family history of malignant neoplasm of prostate: Secondary | ICD-10-CM

## 2020-01-02 DIAGNOSIS — K8012 Calculus of gallbladder with acute and chronic cholecystitis without obstruction: Secondary | ICD-10-CM | POA: Diagnosis present

## 2020-01-02 DIAGNOSIS — K859 Acute pancreatitis without necrosis or infection, unspecified: Secondary | ICD-10-CM | POA: Diagnosis not present

## 2020-01-02 DIAGNOSIS — Z96641 Presence of right artificial hip joint: Secondary | ICD-10-CM | POA: Diagnosis present

## 2020-01-02 DIAGNOSIS — Z20822 Contact with and (suspected) exposure to covid-19: Secondary | ICD-10-CM | POA: Diagnosis present

## 2020-01-02 DIAGNOSIS — Z7982 Long term (current) use of aspirin: Secondary | ICD-10-CM

## 2020-01-02 DIAGNOSIS — K851 Biliary acute pancreatitis without necrosis or infection: Principal | ICD-10-CM | POA: Diagnosis present

## 2020-01-02 DIAGNOSIS — R1011 Right upper quadrant pain: Secondary | ICD-10-CM | POA: Diagnosis not present

## 2020-01-02 DIAGNOSIS — K219 Gastro-esophageal reflux disease without esophagitis: Secondary | ICD-10-CM | POA: Diagnosis present

## 2020-01-02 DIAGNOSIS — E785 Hyperlipidemia, unspecified: Secondary | ICD-10-CM | POA: Diagnosis not present

## 2020-01-02 DIAGNOSIS — Z8546 Personal history of malignant neoplasm of prostate: Secondary | ICD-10-CM

## 2020-01-02 DIAGNOSIS — I4510 Unspecified right bundle-branch block: Secondary | ICD-10-CM | POA: Diagnosis not present

## 2020-01-02 DIAGNOSIS — Z8673 Personal history of transient ischemic attack (TIA), and cerebral infarction without residual deficits: Secondary | ICD-10-CM

## 2020-01-02 DIAGNOSIS — M1990 Unspecified osteoarthritis, unspecified site: Secondary | ICD-10-CM | POA: Diagnosis present

## 2020-01-02 DIAGNOSIS — R7989 Other specified abnormal findings of blood chemistry: Secondary | ICD-10-CM

## 2020-01-02 DIAGNOSIS — K660 Peritoneal adhesions (postprocedural) (postinfection): Secondary | ICD-10-CM | POA: Diagnosis present

## 2020-01-02 DIAGNOSIS — N281 Cyst of kidney, acquired: Secondary | ICD-10-CM | POA: Diagnosis not present

## 2020-01-02 DIAGNOSIS — R531 Weakness: Secondary | ICD-10-CM | POA: Diagnosis not present

## 2020-01-02 LAB — BASIC METABOLIC PANEL
Anion gap: 8 (ref 5–15)
BUN: 24 mg/dL — ABNORMAL HIGH (ref 8–23)
CO2: 26 mmol/L (ref 22–32)
Calcium: 9.2 mg/dL (ref 8.9–10.3)
Chloride: 105 mmol/L (ref 98–111)
Creatinine, Ser: 0.83 mg/dL (ref 0.61–1.24)
GFR calc Af Amer: >60 mL/min (ref >60)
GFR calc non Af Amer: >60 mL/min (ref >60)
Glucose, Bld: 142 mg/dL — ABNORMAL HIGH (ref 70–99)
Potassium: 4.3 mmol/L (ref 3.5–5.1)
Sodium: 139 mmol/L (ref 135–145)

## 2020-01-02 LAB — CBC
HCT: 45.9 % (ref 39.0–52.0)
Hemoglobin: 15.4 g/dL (ref 13.0–17.0)
MCH: 30.6 pg (ref 26.0–34.0)
MCHC: 33.6 g/dL (ref 30.0–36.0)
MCV: 91.1 fL (ref 80.0–100.0)
Platelets: 206 10*3/uL (ref 150–400)
RBC: 5.04 MIL/uL (ref 4.22–5.81)
RDW: 13.6 % (ref 11.5–15.5)
WBC: 9.8 10*3/uL (ref 4.0–10.5)
nRBC: 0 % (ref 0.0–0.2)

## 2020-01-02 LAB — TROPONIN I (HIGH SENSITIVITY): Troponin I (High Sensitivity): 4 ng/L (ref <18)

## 2020-01-02 LAB — LIPASE, BLOOD: Lipase: 1178 U/L — ABNORMAL HIGH (ref 11–51)

## 2020-01-02 MED ORDER — ACETAMINOPHEN 325 MG PO TABS
650.0000 mg | ORAL_TABLET | Freq: Once | ORAL | Status: AC
  Administered 2020-01-02: 650 mg via ORAL
  Filled 2020-01-02: qty 2

## 2020-01-02 MED ORDER — ONDANSETRON 4 MG PO TBDP
4.0000 mg | ORAL_TABLET | Freq: Once | ORAL | Status: AC
  Administered 2020-01-02: 4 mg via ORAL
  Filled 2020-01-02: qty 1

## 2020-01-02 NOTE — ED Triage Notes (Signed)
Pt reprots about 3 hrs ago developed abdominal pain epigastric area that radiates to back "aching feeling" reports pain started while sitting on the recliner. Pt denies any N/V/D pt talks in complete sentence no distress noted

## 2020-01-03 ENCOUNTER — Other Ambulatory Visit: Payer: Self-pay

## 2020-01-03 ENCOUNTER — Observation Stay: Payer: Medicare Other

## 2020-01-03 ENCOUNTER — Emergency Department: Payer: Medicare Other

## 2020-01-03 ENCOUNTER — Encounter: Payer: Self-pay | Admitting: Radiology

## 2020-01-03 DIAGNOSIS — R7989 Other specified abnormal findings of blood chemistry: Secondary | ICD-10-CM | POA: Diagnosis not present

## 2020-01-03 DIAGNOSIS — K859 Acute pancreatitis without necrosis or infection, unspecified: Secondary | ICD-10-CM

## 2020-01-03 DIAGNOSIS — K851 Biliary acute pancreatitis without necrosis or infection: Secondary | ICD-10-CM | POA: Diagnosis not present

## 2020-01-03 DIAGNOSIS — N281 Cyst of kidney, acquired: Secondary | ICD-10-CM | POA: Diagnosis not present

## 2020-01-03 DIAGNOSIS — Z8673 Personal history of transient ischemic attack (TIA), and cerebral infarction without residual deficits: Secondary | ICD-10-CM

## 2020-01-03 DIAGNOSIS — Z8546 Personal history of malignant neoplasm of prostate: Secondary | ICD-10-CM

## 2020-01-03 LAB — HEPATIC FUNCTION PANEL
ALT: 193 U/L — ABNORMAL HIGH (ref 0–44)
AST: 175 U/L — ABNORMAL HIGH (ref 15–41)
Albumin: 3.5 g/dL (ref 3.5–5.0)
Alkaline Phosphatase: 88 U/L (ref 38–126)
Bilirubin, Direct: 0.2 mg/dL (ref 0.0–0.2)
Indirect Bilirubin: 1.1 mg/dL — ABNORMAL HIGH (ref 0.3–0.9)
Total Bilirubin: 1.3 mg/dL — ABNORMAL HIGH (ref 0.3–1.2)
Total Protein: 5.9 g/dL — ABNORMAL LOW (ref 6.5–8.1)

## 2020-01-03 LAB — SARS CORONAVIRUS 2 BY RT PCR (HOSPITAL ORDER, PERFORMED IN ~~LOC~~ HOSPITAL LAB): SARS Coronavirus 2: NEGATIVE

## 2020-01-03 LAB — CREATININE, SERUM
Creatinine, Ser: 0.85 mg/dL (ref 0.61–1.24)
GFR calc Af Amer: >60 mL/min (ref >60)
GFR calc non Af Amer: >60 mL/min (ref >60)

## 2020-01-03 LAB — TROPONIN I (HIGH SENSITIVITY): Troponin I (High Sensitivity): 7 ng/L (ref <18)

## 2020-01-03 LAB — CBC
HCT: 39.9 % (ref 39.0–52.0)
Hemoglobin: 13.5 g/dL (ref 13.0–17.0)
MCH: 30.5 pg (ref 26.0–34.0)
MCHC: 33.8 g/dL (ref 30.0–36.0)
MCV: 90.3 fL (ref 80.0–100.0)
Platelets: 174 10*3/uL (ref 150–400)
RBC: 4.42 MIL/uL (ref 4.22–5.81)
RDW: 13.4 % (ref 11.5–15.5)
WBC: 14.1 10*3/uL — ABNORMAL HIGH (ref 4.0–10.5)
nRBC: 0 % (ref 0.0–0.2)

## 2020-01-03 LAB — TRIGLYCERIDES: Triglycerides: 32 mg/dL (ref <150)

## 2020-01-03 MED ORDER — ASPIRIN 81 MG PO CHEW
81.0000 mg | CHEWABLE_TABLET | Freq: Every day | ORAL | Status: DC
  Administered 2020-01-03 – 2020-01-06 (×3): 81 mg via ORAL
  Filled 2020-01-03 (×3): qty 1

## 2020-01-03 MED ORDER — ONDANSETRON HCL 4 MG/2ML IJ SOLN: 4.0000 mg | Freq: Four times a day (QID) | INTRAMUSCULAR | Status: DC | PRN

## 2020-01-03 MED ORDER — ONDANSETRON HCL 4 MG/2ML IJ SOLN
4.0000 mg | Freq: Once | INTRAMUSCULAR | Status: AC
  Administered 2020-01-03: 4 mg via INTRAVENOUS
  Filled 2020-01-03: qty 2

## 2020-01-03 MED ORDER — ACETAMINOPHEN 650 MG RE SUPP: 650.0000 mg | Freq: Four times a day (QID) | RECTAL | Status: DC | PRN

## 2020-01-03 MED ORDER — LACTATED RINGERS IV BOLUS
1000.0000 mL | Freq: Once | INTRAVENOUS | Status: AC
  Administered 2020-01-03: 1000 mL via INTRAVENOUS

## 2020-01-03 MED ORDER — ONDANSETRON HCL 4 MG PO TABS
4.0000 mg | ORAL_TABLET | Freq: Four times a day (QID) | ORAL | Status: DC | PRN
  Administered 2020-01-03 (×2): 4 mg via ORAL
  Filled 2020-01-03 (×2): qty 1

## 2020-01-03 MED ORDER — ACETAMINOPHEN 325 MG PO TABS
650.0000 mg | ORAL_TABLET | Freq: Four times a day (QID) | ORAL | Status: DC | PRN
  Administered 2020-01-04: 650 mg via ORAL
  Filled 2020-01-03 (×2): qty 2

## 2020-01-03 MED ORDER — ENOXAPARIN SODIUM 40 MG/0.4ML ~~LOC~~ SOLN
40.0000 mg | SUBCUTANEOUS | Status: DC
  Administered 2020-01-03 – 2020-01-06 (×3): 40 mg via SUBCUTANEOUS
  Filled 2020-01-03 (×3): qty 0.4

## 2020-01-03 MED ORDER — MORPHINE SULFATE (PF) 2 MG/ML IV SOLN
2.0000 mg | INTRAVENOUS | Status: DC | PRN
  Administered 2020-01-03 – 2020-01-06 (×3): 2 mg via INTRAVENOUS
  Filled 2020-01-03 (×4): qty 1

## 2020-01-03 MED ORDER — IOHEXOL 300 MG/ML  SOLN
100.0000 mL | Freq: Once | INTRAMUSCULAR | Status: AC | PRN
  Administered 2020-01-03: 100 mL via INTRAVENOUS

## 2020-01-03 MED ORDER — SODIUM CHLORIDE 0.9 % IV SOLN: INTRAVENOUS | Status: DC

## 2020-01-03 MED ORDER — DIPHENHYDRAMINE HCL 25 MG PO CAPS
25.0000 mg | ORAL_CAPSULE | Freq: Four times a day (QID) | ORAL | Status: DC | PRN
  Administered 2020-01-03 – 2020-01-04 (×2): 25 mg via ORAL
  Filled 2020-01-03 (×2): qty 1

## 2020-01-03 MED ORDER — MORPHINE SULFATE (PF) 4 MG/ML IV SOLN
6.0000 mg | Freq: Once | INTRAVENOUS | Status: AC
  Administered 2020-01-03: 6 mg via INTRAVENOUS
  Filled 2020-01-03: qty 2

## 2020-01-03 MED ORDER — ATORVASTATIN CALCIUM 20 MG PO TABS
40.0000 mg | ORAL_TABLET | Freq: Every day | ORAL | Status: DC
  Filled 2020-01-03 (×2): qty 2

## 2020-01-03 MED ORDER — LACTATED RINGERS IV SOLN: INTRAVENOUS | Status: DC

## 2020-01-03 MED ORDER — HYDROCODONE-ACETAMINOPHEN 5-325 MG PO TABS
1.0000 | ORAL_TABLET | ORAL | Status: DC | PRN
  Administered 2020-01-03 – 2020-01-05 (×6): 2 via ORAL
  Administered 2020-01-05: 1 via ORAL
  Administered 2020-01-05 – 2020-01-06 (×3): 2 via ORAL
  Filled 2020-01-03: qty 1
  Filled 2020-01-03 (×9): qty 2

## 2020-01-03 NOTE — Care Plan (Signed)
Mr. Barski is a 79 year old male with medical history significant for prostate cancer, history of CVA who presented to ED on 01/02/2020 with sudden onset epigastric abdominal pain with associated nausea and no vomiting after eating lunch. Patient was found to have elevated lipase at 1178, CT abdomen showed no acute abnormalities including no findings involving the pancreas. Patient admitted with working diagnosis of acute pancreatitis  Acute abdominal pain, presumed acute gallstone pancreatitis given elevated lipase with elevated LFTsthough CT abdomen shows normal pancreas morphology. Upper quadrant ultrasoundSuggest possible early cholecystitis -Surgery consulted, recommending cholecystectomy before discharge once pancreatitis is resolved -Continue IV fluids -Continue pain management -Liquid diet, advance as tolerated  Elevated LFTs. Bilirubin slightly elevated at 1.3. Right upper quadrant ultrasound shows no biliary duct dilatation but possible early signs of cholecystitis -Surgery consulted as mentioned above -Serial CMP  History of prostate cancer no acute concerns  History of CVA without neurologic deficits -Continue aspirin, atorvastatin  Remainder per H&P earlier this morning  Physical exam Patient lying in bed comfortably Regular rate and rhythm, no peripheral edema Abdomen soft, no overt tenderness, no rebound tenderness, no guarding, diminished bowel sounds

## 2020-01-03 NOTE — ED Notes (Signed)
Patient has given consent for RN to call son, Herbie Baltimore @ [phone] and provide update on patient status.

## 2020-01-03 NOTE — ED Provider Notes (Signed)
[facility] Emergency Department Provider Note  ____________________________________________   First MD Initiated Contact with Patient 01/02/20 2353     (approximate)  I have reviewed the triage vital signs and the nursing notes.   HISTORY  Chief Complaint Abdominal Pain (epigastric ) and Back Pain    HPI Scott [name] is a 79 y.o. male with past medical history as below here with severe abdominal pain.  The patient states he was in his usual state of health until this afternoon.  He went out after eating lunch and developed acute onset of severe epigastric abdominal pain.  The pain is aching, gnawing, severe, and associated with nausea and a sensation that he needed to vomit.  He had associated radiation of the pain to his back.  Since then, the pain has very gradually improved, though it remains somewhat moderate to severe.  No history of pancreatitis.  Denies any alcohol use.  No recent medication changes.  He states he is otherwise healthy.  He did recently change his diet to eat a healthier diet, and he has been staying away from fatty foods.  No other complaints.        Past Medical History:  Diagnosis Date  . Allergic rhinitis due to pollen   . Arthritis   . 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 ([place]) 06/23/2019  . GERD (gastroesophageal reflux disease)   . Hyperlipidemia   . Prostate cancer ([place])    cancer  . Right bundle branch block    RBBB    Patient Active Problem List   Diagnosis Date Noted  . 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 ([place]) 06/23/2019  . Left leg numbness 06/18/2019  . Unstable angina ([place])   . Prostate cancer, in remission   . Right bundle branch block   . AV BLOCK, 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  . Hyperlipidemia LDL goal <100 04/28/2010  . ALLERGIC RHINITIS 04/28/2010  . GERD 04/28/2010  . Erectile dysfunction following radical prostatectomy 04/28/2010    Past Surgical History:    Procedure Laterality Date  . APPENDECTOMY    . HERNIA REPAIR  85,03   rt and lt ing hernia  . KNEE ARTHROSCOPY  2012   left  . LEFT HEART CATH AND CORONARY ANGIOGRAPHY N/A 05/30/2018   Procedure: LEFT HEART CATH AND CORONARY ANGIOGRAPHY;  Surgeon: Wellington Hampshire, MD;  Location: [facility];  Service: Cardiovascular;  Laterality: N/A;  . prostatectomy  1998  . ROTATOR CUFF REPAIR     right  . SHOULDER ARTHROSCOPY Right 01/29/2013   Procedure: RIGHT ARTHROSCOPY SHOULDER WITH DEBRIDEMENT, POSSIBLE ROTATOR CUFF REPAIR;  Surgeon: Kerin Salen, MD;  Location: [facility];  Service: Orthopedics;  Laterality: Right;  . TONSILLECTOMY    . TOTAL HIP ARTHROPLASTY  2007   right     Prior to Admission medications   Medication Sig Start Date End Date Taking? Authorizing Provider  acetaminophen (TYLENOL) 325 MG tablet Take 2 tablets (650 mg total) by mouth every 6 (six) hours as needed for mild pain (or temp > 37.5 C (99.5 F)). 06/19/19   Nicholes Mango, MD  aspirin 81 MG tablet Take 1 tablet (81 mg total) by mouth daily. Take aspirin 81 mg once daily by mouth for 3 weeks(21 days) and then stop 06/19/19   Gouru, Illene Silver, MD  atorvastatin (LIPITOR) 40 MG tablet TAKE 1 TABLET BY MOUTH DAILY AT 6PM 07/22/19   Owens Loffler, MD  cyanocobalamin 2000 MCG  tablet Take 2,000 mcg by mouth daily.    [provider]  ofloxacin (FLOXIN) 0.3 % OTIC solution Place 10 drops into the right ear daily. 07/30/19   Copland, Frederico Hamman, MD  omeprazole (PRILOSEC) 40 MG capsule TAKE 1 CAPSULE EVERY DAY 10/13/19   Copland, Frederico Hamman, MD  Red Yeast Rice Extract (RED YEAST RICE PO) Take 900 mg by mouth every morning.    [provider]  vitamin E (VITAMIN E) 400 UNIT capsule Take 400 Units by mouth daily.    [provider]    Allergies Patient has no known allergies.  Family History  Problem Relation Age of Onset  . Cancer Father        prostate  . Cancer Brother         prostate  . Cancer Brother        prostate    Social History Social History   Tobacco Use  . Smoking status: Never Smoker  . Smokeless tobacco: Never Used  Substance Use Topics  . Alcohol use: No  . Drug use: No    Review of Systems  Review of Systems  Constitutional: Positive for fatigue. Negative for chills and fever.  HENT: Negative for sore throat.   Respiratory: Negative for shortness of breath.   Cardiovascular: Negative for chest pain.  Gastrointestinal: Positive for abdominal pain, nausea and vomiting.  Genitourinary: Negative for flank pain.  Musculoskeletal: Negative for neck pain.  Skin: Negative for rash and wound.  Allergic/Immunologic: Negative for immunocompromised state.  Neurological: Positive for weakness. Negative for numbness.  Hematological: Does not bruise/bleed easily.  All other systems reviewed and are negative.    ____________________________________________  PHYSICAL EXAM:      VITAL SIGNS: ED Triage Vitals  Enc Vitals Group     BP 01/02/20 2002 (!) 151/83     Pulse Rate 01/02/20 2002 98     Resp 01/02/20 2002 20     Temp 01/02/20 2002 97.6 F (36.4 C)     Temp Source 01/02/20 2002 Oral     SpO2 01/02/20 2002 98 %     Weight 01/02/20 2005 165 lb (74.8 kg)     Height 01/02/20 2005 5\' 8"  (1.727 m)     Head Circumference --      Peak Flow --      Pain Score 01/02/20 2004 7     Pain Loc --      Pain Edu? --      Excl. in [place]? --      Physical Exam Vitals and nursing note reviewed.  Constitutional:      General: He is not in acute distress.    Appearance: He is well-developed.  HENT:     Head: Normocephalic and atraumatic.     Comments: Mildly dry mucous membranes Eyes:     Conjunctiva/sclera: Conjunctivae normal.  Cardiovascular:     Rate and Rhythm: Normal rate and regular rhythm.     Heart sounds: Normal heart sounds. No murmur. No friction rub.  Pulmonary:     Effort: Pulmonary effort is normal. No respiratory distress.       Breath sounds: Normal breath sounds. No wheezing or rales.  Abdominal:     General: There is no distension.     Palpations: Abdomen is soft.     Tenderness: There is abdominal tenderness in the epigastric area. There is no guarding or rebound.  Musculoskeletal:     Cervical back: Neck supple.  Skin:    General:  Skin is warm.     Capillary Refill: Capillary refill takes less than 2 seconds.  Neurological:     Mental Status: He is alert and oriented to person, place, and time.     Motor: No abnormal muscle tone.       ____________________________________________   LABS (all labs ordered are listed, but only abnormal results are displayed)  Labs Reviewed  BASIC METABOLIC PANEL - Abnormal; Notable for the following components:      Result Value   Glucose, Bld 142 (*)    BUN 24 (*)    All other components within normal limits  LIPASE, BLOOD - Abnormal; Notable for the following components:   Lipase 1,178 (*)    All other components within normal limits  CBC  CBG MONITORING, ED  TROPONIN I (HIGH SENSITIVITY)  TROPONIN I (HIGH SENSITIVITY)    ____________________________________________  EKG: Normal sinus rhythm, ventricular rate 99.  QRS 142, QTc 42.  Left anterior fascicular block noted.  Right bundle branch block noted.  No acute ST elevations or depressions. ________________________________________  RADIOLOGY All imaging, including plain films, CT scans, and ultrasounds, independently reviewed by me, and interpretations confirmed via formal radiology reads.  ED MD interpretation:   CT abdomen/pelvis:: No acute abnormality or complication  Official radiology report(s): CT ABDOMEN PELVIS W CONTRAST  Result Date: 01/03/2020 CLINICAL DATA:  Abdominal distension epigastric pain EXAM: CT ABDOMEN AND PELVIS WITH CONTRAST TECHNIQUE: Multidetector CT imaging of the abdomen and pelvis was performed using the standard protocol following bolus administration of intravenous  contrast. CONTRAST:  154mL OMNIPAQUE IOHEXOL 300 MG/ML  SOLN COMPARISON:  CT 10/17/2016, MRI 01/08/2012 FINDINGS: Lower chest: Lung bases demonstrate no acute consolidation or effusion. Hepatobiliary: No calcified gallstone. No focal hepatic abnormality or biliary dilatation Pancreas: Unremarkable. No pancreatic ductal dilatation or surrounding inflammatory changes. Spleen: Normal in size without focal abnormality. Adrenals/Urinary Tract: Adrenal glands are normal. Bilateral kidney cysts. No hydronephrosis. The bladder is normal Stomach/Bowel: Stomach is within normal limits. No evidence of bowel wall thickening, distention, or inflammatory changes. Some fluid in the right colon. Status post appendectomy. Vascular/Lymphatic: Moderate aortic atherosclerosis without aneurysm. No suspicious adenopathy Reproductive: Status post prostatectomy. Other: Negative for free air or free fluid. Musculoskeletal: Scoliosis and degenerative changes of the spine. Previous right hip replacement. Lucent changes about the right femoral component at the trochanteric region. IMPRESSION: 1. No CT evidence for acute intra-abdominal or pelvic abnormality. 2. Bilateral renal cysts 3. Prior right hip replacement. Lucent changes about the right femoral component at the trochanter suggesting potential particle disease. Electronically Signed   By: Donavan Foil M.D.   On: 01/03/2020 01:37    ____________________________________________  PROCEDURES   Procedure(s) performed (including Critical Care):  Procedures  ____________________________________________  INITIAL IMPRESSION / MDM / [place] / ED COURSE  As part of my medical decision making, I reviewed the following data within the [place] notes reviewed and incorporated, Old chart reviewed, Notes from prior ED visits, and [place] Controlled Substance Database       *Scott [name] was evaluated in Emergency Department on 01/03/2020 for  the symptoms described in the history of present illness. He was evaluated in the context of the global COVID-19 pandemic, which necessitated consideration that the patient might be at risk for infection with the SARS-CoV-2 virus that causes COVID-19. Institutional protocols and algorithms that pertain to the evaluation of patients at risk for COVID-19 are in a state of rapid change based on  information released by regulatory bodies including the CDC and federal and state organizations. These policies and algorithms were followed during the patient's care in the ED.  Some ED evaluations and interventions may be delayed as a result of limited staffing during the pandemic.*     Medical Decision Making:  79 yo M here with severe epigastric abd pain. Labs are c/w acute pancreatitis with lipase 1178. No h/o gallstones. Abd is soft. CT scan w/o complication (no stranding noted around pancreas interestingly). Suspect idiopathic acute pancreatitis as pt denies alcohol, does not take meds, no h/o gallstones. Admit for fluids, pain management.  ____________________________________________  FINAL CLINICAL IMPRESSION(S) / ED DIAGNOSES  Final diagnoses:  Acute pancreatitis without infection or necrosis, unspecified pancreatitis type     MEDICATIONS GIVEN DURING THIS VISIT:  Medications  lactated ringers infusion (has no administration in time range)  acetaminophen (TYLENOL) tablet 650 mg (650 mg Oral Given 01/02/20 2027)  ondansetron (ZOFRAN-ODT) disintegrating tablet 4 mg (4 mg Oral Given 01/02/20 2028)  lactated ringers bolus 1,000 mL (1,000 mLs Intravenous New Bag/Given 01/03/20 0048)  ondansetron (ZOFRAN) injection 4 mg (4 mg Intravenous Given 01/03/20 0048)  morphine 4 MG/ML injection 6 mg (6 mg Intravenous Given 01/03/20 0048)  iohexol (OMNIPAQUE) 300 MG/ML solution 100 mL (100 mLs Intravenous Contrast Given 01/03/20 0100)     ED Discharge Orders    None       Note:  This document was prepared  using Dragon voice recognition software and may include unintentional dictation errors.   Duffy Bruce, MD 01/03/20 [id number]

## 2020-01-03 NOTE — H&P (Signed)
History and Physical    Scott [name] [id number] DOB: 10-Feb-1941 DOA: 01/02/2020  PCP: Owens Loffler, MD   Patient coming from: Home  I have personally briefly reviewed patient's old medical records in [place]  Chief Complaint: Abdominal pain  HPI: Scott [name] is a 79 y.o. male with medical history significant for prostate cancer history of CVA, who presents to the emergency room for evaluation of severe acute onset epigastric abdominal pain that started after having lunch, associated with nausea but without vomiting.  The pain radiated to his back and has waxed and waned of moderate to severe since onset.  Denies change in bowel habits, denies blood in the stool.  Denies fever or chills.  Denies alcohol use.  States that he recently switched a healthier diet losing 12 pounds in the past 3 months. ED Course: Work-up significant for lipase of 1178. LFTs and  triglycerides pending.  CT abdomen showed no acute intra-abdominal or pelvic abnormality.  Hospitalist consulted for admission  Review of Systems: As per HPI otherwise 10 point review of systems negative.    Past Medical History:  Diagnosis Date  . Allergic rhinitis due to pollen   . Arthritis   . 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 ([place]) 06/23/2019  . GERD (gastroesophageal reflux disease)   . Hyperlipidemia   . Prostate cancer ([place])    cancer  . Right bundle branch block    RBBB    Past Surgical History:  Procedure Laterality Date  . APPENDECTOMY    . HERNIA REPAIR  85,03   rt and lt ing hernia  . KNEE ARTHROSCOPY  2012   left  . LEFT HEART CATH AND CORONARY ANGIOGRAPHY N/A 05/30/2018   Procedure: LEFT HEART CATH AND CORONARY ANGIOGRAPHY;  Surgeon: Wellington Hampshire, MD;  Location: [facility];  Service: Cardiovascular;  Laterality: N/A;  . prostatectomy  1998  . ROTATOR CUFF REPAIR     right  . SHOULDER ARTHROSCOPY Right 01/29/2013   Procedure: RIGHT ARTHROSCOPY  SHOULDER WITH DEBRIDEMENT, POSSIBLE ROTATOR CUFF REPAIR;  Surgeon: Kerin Salen, MD;  Location: [facility];  Service: Orthopedics;  Laterality: Right;  . TONSILLECTOMY    . TOTAL HIP ARTHROPLASTY  2007   right      reports that he has never smoked. He has never used smokeless tobacco. He reports that he does not drink alcohol or use drugs.  No Known Allergies  Family History  Problem Relation Age of Onset  . Cancer Father        prostate  . Cancer Brother        prostate  . Cancer Brother        prostate     Prior to Admission medications   Medication Sig Start Date End Date Taking? Authorizing Provider  acetaminophen (TYLENOL) 325 MG tablet Take 2 tablets (650 mg total) by mouth every 6 (six) hours as needed for mild pain (or temp > 37.5 C (99.5 F)). 06/19/19   Nicholes Mango, MD  aspirin 81 MG tablet Take 1 tablet (81 mg total) by mouth daily. Take aspirin 81 mg once daily by mouth for 3 weeks(21 days) and then stop 06/19/19   Gouru, Illene Silver, MD  atorvastatin (LIPITOR) 40 MG tablet TAKE 1 TABLET BY MOUTH DAILY AT 6PM 07/22/19   Copland, Frederico Hamman, MD  cyanocobalamin 2000 MCG tablet Take 2,000 mcg by mouth daily.    [provider]  ofloxacin (FLOXIN) 0.3 % OTIC  solution Place 10 drops into the right ear daily. 07/30/19   Copland, Frederico Hamman, MD  omeprazole (PRILOSEC) 40 MG capsule TAKE 1 CAPSULE EVERY DAY 10/13/19   Copland, Frederico Hamman, MD  Red Yeast Rice Extract (RED YEAST RICE PO) Take 900 mg by mouth every morning.    [provider]  vitamin E (VITAMIN E) 400 UNIT capsule Take 400 Units by mouth daily.    [provider]    Physical Exam: Vitals:   01/02/20 2002 01/02/20 2005 01/03/20 0030 01/03/20 0130  BP: (!) 151/83  113/73   Pulse: 98  95 91  Resp: 20  14 12   Temp: 97.6 F (36.4 C)     TempSrc: Oral     SpO2: 98%  96% 91%  Weight:  74.8 kg    Height:  5\' 8"  (1.727 m)       Vitals:   01/02/20 2002 01/02/20 2005 01/03/20 0030  01/03/20 0130  BP: (!) 151/83  113/73   Pulse: 98  95 91  Resp: 20  14 12   Temp: 97.6 F (36.4 C)     TempSrc: Oral     SpO2: 98%  96% 91%  Weight:  74.8 kg    Height:  5\' 8"  (1.727 m)      Constitutional: Alert and awake, oriented x3, not in any acute distress. Eyes: PERLA, EOMI, irises appear normal, anicteric sclera,  ENMT: external ears and nose appear normal, normal hearing             Lips appears normal, oropharynx mucosa, tongue, posterior pharynx appear normal  Neck: neck appears normal, no masses, normal ROM, no thyromegaly, no JVD  CVS: S1-S2 clear, no murmur rubs or gallops,  , no carotid bruits, pedal pulses palpable, No LE edema Respiratory:  clear to auscultation bilaterally, no wheezing, rales or rhonchi. Respiratory effort normal. No accessory muscle use.  Abdomen: Epigastric tenderness, nondistended, normal bowel sounds, no hepatosplenomegaly, no hernias Musculoskeletal: : no cyanosis, clubbing , no contractures or atrophy Neuro: Cranial nerves II-XII intact, sensation, reflexes normal, strength Psych: judgement and insight appear normal, stable mood and affect,  Skin: no rashes or lesions or ulcers, no induration or nodules   Labs on Admission: I have personally reviewed following labs and imaging studies  CBC: Recent Labs  Lab 01/02/20 2018  WBC 9.8  HGB 15.4  HCT 45.9  MCV 91.1  PLT 99991111   Basic Metabolic Panel: Recent Labs  Lab 01/02/20 2018  NA 139  K 4.3  CL 105  CO2 26  GLUCOSE 142*  BUN 24*  CREATININE 0.83  CALCIUM 9.2   GFR: Estimated Creatinine Clearance: 73.3 mL/min (by C-G formula based on SCr of 0.83 mg/dL). Liver Function Tests: No results for input(s): AST, ALT, ALKPHOS, BILITOT, PROT, ALBUMIN in the last 168 hours. Recent Labs  Lab 01/02/20 2018  LIPASE 1,178*   No results for input(s): AMMONIA in the last 168 hours. Coagulation Profile: No results for input(s): INR, PROTIME in the last 168 hours. Cardiac Enzymes: No  results for input(s): CKTOTAL, CKMB, CKMBINDEX, TROPONINI in the last 168 hours. BNP (last 3 results) No results for input(s): PROBNP in the last 8760 hours. HbA1C: No results for input(s): HGBA1C in the last 72 hours. CBG: No results for input(s): GLUCAP in the last 168 hours. Lipid Profile: No results for input(s): CHOL, HDL, LDLCALC, TRIG, CHOLHDL, LDLDIRECT in the last 72 hours. Thyroid Function Tests: No results for input(s): TSH, T4TOTAL, FREET4, T3FREE, THYROIDAB in the  last 72 hours. Anemia Panel: No results for input(s): VITAMINB12, FOLATE, FERRITIN, TIBC, IRON, RETICCTPCT in the last 72 hours. Urine analysis:    Component Value Date/Time   COLORURINE YELLOW (A) 06/18/2019 2113   APPEARANCEUR CLEAR (A) 06/18/2019 2113   LABSPEC 1.023 06/18/2019 2113   PHURINE 6.0 06/18/2019 2113   GLUCOSEU NEGATIVE 06/18/2019 2113   HGBUR NEGATIVE 06/18/2019 2113   BILIRUBINUR NEGATIVE 06/18/2019 2113   BILIRUBINUR Negative 08/07/2018 [place] 06/18/2019 2113   PROTEINUR NEGATIVE 06/18/2019 2113   UROBILINOGEN 0.2 08/07/2018 1526   NITRITE NEGATIVE 06/18/2019 2113   LEUKOCYTESUR NEGATIVE 06/18/2019 2113    Radiological Exams on Admission: CT ABDOMEN PELVIS W CONTRAST  Result Date: 01/03/2020 CLINICAL DATA:  Abdominal distension epigastric pain EXAM: CT ABDOMEN AND PELVIS WITH CONTRAST TECHNIQUE: Multidetector CT imaging of the abdomen and pelvis was performed using the standard protocol following bolus administration of intravenous contrast. CONTRAST:  167mL OMNIPAQUE IOHEXOL 300 MG/ML  SOLN COMPARISON:  CT 10/17/2016, MRI 01/08/2012 FINDINGS: Lower chest: Lung bases demonstrate no acute consolidation or effusion. Hepatobiliary: No calcified gallstone. No focal hepatic abnormality or biliary dilatation Pancreas: Unremarkable. No pancreatic ductal dilatation or surrounding inflammatory changes. Spleen: Normal in size without focal abnormality. Adrenals/Urinary Tract: Adrenal  glands are normal. Bilateral kidney cysts. No hydronephrosis. The bladder is normal Stomach/Bowel: Stomach is within normal limits. No evidence of bowel wall thickening, distention, or inflammatory changes. Some fluid in the right colon. Status post appendectomy. Vascular/Lymphatic: Moderate aortic atherosclerosis without aneurysm. No suspicious adenopathy Reproductive: Status post prostatectomy. Other: Negative for free air or free fluid. Musculoskeletal: Scoliosis and degenerative changes of the spine. Previous right hip replacement. Lucent changes about the right femoral component at the trochanteric region. IMPRESSION: 1. No CT evidence for acute intra-abdominal or pelvic abnormality. 2. Bilateral renal cysts 3. Prior right hip replacement. Lucent changes about the right femoral component at the trochanter suggesting potential particle disease. Electronically Signed   By: Donavan Foil M.D.   On: 01/03/2020 01:37    EKG: Independently reviewed.   Assessment/Plan Active Problems:   Acute pancreatitis -Patient presented with sudden onset severe epigastric pain following a meal, associated with nausea with lipase of 1000.  CT abdomen and pelvis showing no acute abnormality -Etiology uncertain.  Patient denies alcohol use -Follow-up hepatic function panel, follow-up triglycerides -Clear liquid diet -IV hydration, IV pain meds    History of prostate cancer -No acute concerns  History of CVA (cerebrovascular accident) without residual deficits -Continue aspirin and atorvastatin    DVT prophylaxis: Lovenox  Code Status: full code  Family Communication:  none  Disposition Plan: Back to previous home environment Consults called: none  Status:obs    Athena Masse MD [facility]     01/03/2020, 2:25 AM

## 2020-01-03 NOTE — ED Notes (Signed)
Assisted pt to the restroom. Pt ambulated w/o difficulty

## 2020-01-03 NOTE — ED Notes (Signed)
Nurse Secretary has requested Transport for patient.

## 2020-01-03 NOTE — ED Notes (Signed)
Patient to Ct scan

## 2020-01-03 NOTE — ED Notes (Signed)
Ultrasound at patient bedside. 

## 2020-01-03 NOTE — Consult Note (Signed)
SURGICAL CONSULTATION NOTE   HISTORY OF PRESENT ILLNESS (HPI):  79 y.o. male presented to [facility] ED for evaluation of abdominal pain. Patient reports having abdominal pain since 2 days ago.  The pain started on the epigastric area, radiates to the right upper quadrant and to his back.  He denies any alleviating or aggravating factor.  Patient endorses nausea but no vomiting.  At the ED he had labs that shows leukocytosis up to 14,000, bilirubin up to 1.3 and lipase elevated to 1178.  CT scan of the abdomen does not show any fluid or free air.  I personally evaluate the images.  Abdominal ultrasound shows large.  I also evaluated the images.  Surgery is consulted by Dr. Lonny Prude in this context for evaluation and management of calcium pancreatitis.  PAST MEDICAL HISTORY (PMH):  Past Medical History:  Diagnosis Date  . Allergic rhinitis due to pollen   . Arthritis   . 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 GERD (gastroesophageal reflux disease)   . Hyperlipidemia   . Prostate cancer ([place])    cancer  . Right bundle branch block    RBBB     PAST SURGICAL HISTORY [facility]):  Past Surgical History:  Procedure Laterality Date  . APPENDECTOMY    . HERNIA REPAIR  85,03   rt and lt ing hernia  . KNEE ARTHROSCOPY  2012   left  . LEFT HEART CATH AND CORONARY ANGIOGRAPHY N/A 05/30/2018   Procedure: LEFT HEART CATH AND CORONARY ANGIOGRAPHY;  Surgeon: Wellington Hampshire, MD;  Location: [facility];  Service: Cardiovascular;  Laterality: N/A;  . prostatectomy  1998  . ROTATOR CUFF REPAIR     right  . SHOULDER ARTHROSCOPY Right 01/29/2013   Procedure: RIGHT ARTHROSCOPY SHOULDER WITH DEBRIDEMENT, POSSIBLE ROTATOR CUFF REPAIR;  Surgeon: Kerin Salen, MD;  Location: [facility];  Service: Orthopedics;  Laterality: Right;  . TONSILLECTOMY    . TOTAL HIP ARTHROPLASTY  2007   right      MEDICATIONS:  Prior to Admission medications   Medication  Sig Start Date End Date Taking? Authorizing Provider  acetaminophen (TYLENOL) 325 MG tablet Take 2 tablets (650 mg total) by mouth every 6 (six) hours as needed for mild pain (or temp > 37.5 C (99.5 F)). 06/19/19  Yes Gouru, Illene Silver, MD  aspirin 81 MG tablet Take 1 tablet (81 mg total) by mouth daily. Take aspirin 81 mg once daily by mouth for 3 weeks(21 days) and then stop 06/19/19  Yes Gouru, Aruna, MD  cyanocobalamin 2000 MCG tablet Take 2,000 mcg by mouth daily.   Yes [provider]  omeprazole (PRILOSEC) 40 MG capsule TAKE 1 CAPSULE EVERY DAY 10/13/19  Yes Copland, Frederico Hamman, MD  Red Yeast Rice Extract (RED YEAST RICE PO) Take 900 mg by mouth every morning.   Yes [provider]  vitamin E (VITAMIN E) 400 UNIT capsule Take 400 Units by mouth daily.   Yes [provider]     ALLERGIES:  No Known Allergies   SOCIAL HISTORY:  Social History   Socioeconomic History  . Marital status: Married    Spouse name: Not on file  . Number of children: Not on file  . Years of education: Not on file  . Highest education level: Not on file  Occupational History  . Occupation: Herbalist: RETIRED  Tobacco Use  . Smoking status: Never Smoker  . Smokeless tobacco: Never Used  Substance and Sexual Activity  . Alcohol use: No  . Drug use: No  . Sexual activity: Not on file  Other Topics Concern  . Not on file  Social History Narrative   Former competitive bodybuilder, Geophysicist/field seismologist, all natural   [employer], [place], [employer], multiple years   Social Determinants of Radio broadcast assistant Strain:   . Difficulty of Paying Living Expenses:   Food Insecurity:   . Worried About Charity fundraiser in the Last Year:   . Arboriculturist in the Last Year:   Transportation Needs:   . Film/video editor (Medical):   Marland Kitchen Lack of Transportation (Non-Medical):   Physical Activity:   . Days of Exercise per Week:   . Minutes of Exercise per  Session:   Stress:   . Feeling of Stress :   Social Connections:   . Frequency of Communication with Friends and Family:   . Frequency of Social Gatherings with Friends and Family:   . Attends Religious Services:   . Active Member of Clubs or Organizations:   . Attends Archivist Meetings:   Marland Kitchen Marital Status:   Intimate Partner Violence:   . Fear of Current or Ex-Partner:   . Emotionally Abused:   Marland Kitchen Physically Abused:   . Sexually Abused:       FAMILY HISTORY:  Family History  Problem Relation Age of Onset  . Cancer Father        prostate  . Cancer Brother        prostate  . Cancer Brother        prostate     REVIEW OF SYSTEMS:  Constitutional: denies weight loss, fever, chills, or sweats  Eyes: denies any other vision changes, history of eye injury  ENT: denies sore throat, hearing problems  Respiratory: denies shortness of breath, wheezing  Cardiovascular: denies chest pain, palpitations  Gastrointestinal: Positive abdominal pain, nausea.  Negative vomiting Genitourinary: denies burning with urination or urinary frequency Musculoskeletal: denies any other joint pains or cramps  Skin: denies any other rashes or skin discolorations  Neurological: denies any other headache, dizziness, weakness  Psychiatric: denies any other depression, anxiety   All other review of systems were negative   VITAL SIGNS:  Temp:  [97.6 F (36.4 C)-98 F (36.7 C)] 98 F (36.7 C) (05/15 1537) Pulse Rate:  [63-98] 88 (05/15 1537) Resp:  [11-20] 14 (05/15 1537) BP: (98-151)/(58-83) 115/66 (05/15 1537) SpO2:  [89 %-100 %] 96 % (05/15 1537) Weight:  [74.8 kg] 74.8 kg (05/14 2005)     Height: 5\' 8"  (172.7 cm) Weight: 74.8 kg BMI (Calculated): 25.09   INTAKE/OUTPUT:  This shift: Total I/O In: 3241.9 [I.V.:2241.9; IV Piggyback:1000] Out: -   Last 2 shifts: @IOLAST2SHIFTS @   PHYSICAL EXAM:  Constitutional:  -- Normal body habitus  -- Awake, alert, and oriented x3  Eyes:   -- Pupils equally round and reactive to light  -- No scleral icterus  Ear, nose, and throat:  -- No jugular venous distension  Pulmonary:  -- No crackles  -- Equal breath sounds bilaterally -- Breathing non-labored at rest Cardiovascular:  -- S1, S2 present  -- No pericardial rubs Gastrointestinal:  -- Abdomen soft, mild, non-distended, no guarding or rebound tenderness -- No abdominal masses appreciated, pulsatile or otherwise  Musculoskeletal and Integumentary:  -- Wounds: None appreciated -- Extremities: B/L UE and LE FROM, hands and feet warm, no edema  Neurologic:  -- Motor function:  intact and symmetric -- Sensation: intact and symmetric   Labs:  CBC Latest Ref Rng & Units 01/03/2020 01/02/2020 06/19/2019  WBC 4.0 - 10.5 K/uL 14.1(H) 9.8 11.4(H)  Hemoglobin 13.0 - 17.0 g/dL 13.5 15.4 15.1  Hematocrit 39.0 - 52.0 % 39.9 45.9 45.6  Platelets 150 - 400 K/uL 174 206 203   CMP Latest Ref Rng & Units 01/03/2020 01/02/2020 06/18/2019  Glucose 70 - 99 mg/dL - 142(H) 140(H)  BUN 8 - 23 mg/dL - 24(H) 24(H)  Creatinine 0.61 - 1.24 mg/dL 0.85 0.83 0.80  Sodium 135 - 145 mmol/L - 139 140  Potassium 3.5 - 5.1 mmol/L - 4.3 4.1  Chloride 98 - 111 mmol/L - 105 109  CO2 22 - 32 mmol/L - 26 21(L)  Calcium 8.9 - 10.3 mg/dL - 9.2 9.6  Total Protein 6.5 - 8.1 g/dL 5.9(L) - 7.0  Total Bilirubin 0.3 - 1.2 mg/dL 1.3(H) - 0.6  Alkaline Phos 38 - 126 U/L 88 - 67  AST 15 - 41 U/L 175(H) - 20  ALT 0 - 44 U/L 193(H) - 19    Imaging studies:  EXAM: ULTRASOUND ABDOMEN LIMITED RIGHT UPPER QUADRANT  COMPARISON:  None.  FINDINGS: Gallbladder:  Borderline gallbladder wall thickening, measuring 4 mm. Gallbladder is filled with sludge. No sonographic Murphy's sign elicited during the exam, per the sonographer.  Common bile duct:  Diameter: 5 mm  Liver:  No focal lesion identified. Within normal limits in parenchymal echogenicity. Portal vein is patent on color Doppler imaging  with normal direction of blood flow towards the liver.  Other: None.  IMPRESSION: 1. Gallbladder is filled with sludge. Borderline gallbladder wall thickening, suspicious for early/mild acute cholecystitis. 2. No bile duct dilatation. 3. Liver appears normal.   Electronically Signed   By: Franki Cabot M.D.   On: 01/03/2020 09:03  Assessment/Plan:  79 y.o. male with gallstone pancreatitis, complicated by pertinent comorbidities including CVA, GERD, right bundle branch block.  Patient with history, imaging and labs consistent with gallstone pancreatitis.  The patient with elevated lipase.  There is large on the ultrasound.  I agree with current admission to start with conservative management with IV fluids and pain management.  I discussed with the patient recommendation of having cholecystectomy before discharge.  The best timing for cholecystectomy is after pancreatitis has resolved but before he goes home.  I will continue to follow to discuss with patient about the best timing of having surgery.  At the moment we will discuss the patient about the surgical management, risk of the procedures and recovery and asked any question of the patient may have.  Arnold Long, MD

## 2020-01-04 DIAGNOSIS — Z8546 Personal history of malignant neoplasm of prostate: Secondary | ICD-10-CM | POA: Diagnosis not present

## 2020-01-04 DIAGNOSIS — Z8673 Personal history of transient ischemic attack (TIA), and cerebral infarction without residual deficits: Secondary | ICD-10-CM

## 2020-01-04 DIAGNOSIS — I4510 Unspecified right bundle-branch block: Secondary | ICD-10-CM | POA: Diagnosis present

## 2020-01-04 DIAGNOSIS — R1011 Right upper quadrant pain: Secondary | ICD-10-CM | POA: Diagnosis not present

## 2020-01-04 DIAGNOSIS — K851 Biliary acute pancreatitis without necrosis or infection: Secondary | ICD-10-CM | POA: Diagnosis present

## 2020-01-04 DIAGNOSIS — Z7982 Long term (current) use of aspirin: Secondary | ICD-10-CM | POA: Diagnosis not present

## 2020-01-04 DIAGNOSIS — K660 Peritoneal adhesions (postprocedural) (postinfection): Secondary | ICD-10-CM | POA: Diagnosis present

## 2020-01-04 DIAGNOSIS — Z8042 Family history of malignant neoplasm of prostate: Secondary | ICD-10-CM | POA: Diagnosis not present

## 2020-01-04 DIAGNOSIS — K219 Gastro-esophageal reflux disease without esophagitis: Secondary | ICD-10-CM | POA: Diagnosis present

## 2020-01-04 DIAGNOSIS — R7989 Other specified abnormal findings of blood chemistry: Secondary | ICD-10-CM

## 2020-01-04 DIAGNOSIS — K859 Acute pancreatitis without necrosis or infection, unspecified: Secondary | ICD-10-CM | POA: Diagnosis not present

## 2020-01-04 DIAGNOSIS — Z20822 Contact with and (suspected) exposure to covid-19: Secondary | ICD-10-CM | POA: Diagnosis present

## 2020-01-04 DIAGNOSIS — E785 Hyperlipidemia, unspecified: Secondary | ICD-10-CM | POA: Diagnosis present

## 2020-01-04 DIAGNOSIS — Z96641 Presence of right artificial hip joint: Secondary | ICD-10-CM | POA: Diagnosis present

## 2020-01-04 DIAGNOSIS — K858 Other acute pancreatitis without necrosis or infection: Secondary | ICD-10-CM | POA: Diagnosis not present

## 2020-01-04 DIAGNOSIS — K811 Chronic cholecystitis: Secondary | ICD-10-CM | POA: Diagnosis not present

## 2020-01-04 DIAGNOSIS — K8012 Calculus of gallbladder with acute and chronic cholecystitis without obstruction: Secondary | ICD-10-CM | POA: Diagnosis present

## 2020-01-04 DIAGNOSIS — M1990 Unspecified osteoarthritis, unspecified site: Secondary | ICD-10-CM | POA: Diagnosis present

## 2020-01-04 LAB — CBC
HCT: 36.8 % — ABNORMAL LOW (ref 39.0–52.0)
Hemoglobin: 12.4 g/dL — ABNORMAL LOW (ref 13.0–17.0)
MCH: 30.5 pg (ref 26.0–34.0)
MCHC: 33.7 g/dL (ref 30.0–36.0)
MCV: 90.4 fL (ref 80.0–100.0)
Platelets: 151 10*3/uL (ref 150–400)
RBC: 4.07 MIL/uL — ABNORMAL LOW (ref 4.22–5.81)
RDW: 14.1 % (ref 11.5–15.5)
WBC: 9.3 10*3/uL (ref 4.0–10.5)
nRBC: 0 % (ref 0.0–0.2)

## 2020-01-04 LAB — COMPREHENSIVE METABOLIC PANEL
ALT: 108 U/L — ABNORMAL HIGH (ref 0–44)
AST: 54 U/L — ABNORMAL HIGH (ref 15–41)
Albumin: 3.1 g/dL — ABNORMAL LOW (ref 3.5–5.0)
Alkaline Phosphatase: 68 U/L (ref 38–126)
Anion gap: 5 (ref 5–15)
BUN: 20 mg/dL (ref 8–23)
CO2: 25 mmol/L (ref 22–32)
Calcium: 8 mg/dL — ABNORMAL LOW (ref 8.9–10.3)
Chloride: 108 mmol/L (ref 98–111)
Creatinine, Ser: 0.84 mg/dL (ref 0.61–1.24)
GFR calc Af Amer: >60 mL/min (ref >60)
GFR calc non Af Amer: >60 mL/min (ref >60)
Glucose, Bld: 100 mg/dL — ABNORMAL HIGH (ref 70–99)
Potassium: 4.1 mmol/L (ref 3.5–5.1)
Sodium: 138 mmol/L (ref 135–145)
Total Bilirubin: 1.4 mg/dL — ABNORMAL HIGH (ref 0.3–1.2)
Total Protein: 5.3 g/dL — ABNORMAL LOW (ref 6.5–8.1)

## 2020-01-04 LAB — LIPASE, BLOOD: Lipase: 29 U/L (ref 11–51)

## 2020-01-04 MED ORDER — SENNOSIDES-DOCUSATE SODIUM 8.6-50 MG PO TABS
2.0000 | ORAL_TABLET | Freq: Two times a day (BID) | ORAL | Status: DC
  Administered 2020-01-04 – 2020-01-06 (×3): 2 via ORAL
  Filled 2020-01-04 (×4): qty 2

## 2020-01-04 MED ORDER — POLYETHYLENE GLYCOL 3350 17 G PO PACK
17.0000 g | PACK | Freq: Every day | ORAL | Status: DC
  Administered 2020-01-04 – 2020-01-06 (×2): 17 g via ORAL
  Filled 2020-01-04 (×2): qty 1

## 2020-01-04 NOTE — Progress Notes (Signed)
[facility]  PROGRESS NOTE  Scott [name] [id number] DOB: July 29, 1941 DOA: 01/02/2020 PCP: Owens Loffler, MD Admit date - 01/02/2020   Admitting Physician Desiree Hane, MD  Outpatient Primary MD for the patient is Owens Loffler, MD  LOS - 0 Brief Narrative   [name] is a 79 year old male with medical history significant for prostate cancer, history of CVA who presented to ED on 01/02/2020 with sudden onset epigastric abdominal pain with associated nausea and no vomiting after eating lunch. Patient was found to have elevated lipase at 1178, CT abdomen showed no acute abnormalities including no findings involving the pancreas. Patient admitted with working diagnosis of acute gallstone pancreatitis.  Right upper quadrant ultrasound obtained for further evaluation concerning for early development of cholecystitis.  Surgery was consulted for further assistance    Subjective  Today reports abdominal pain is essentially resolved.  Still sticking with liquid diet without any nausea or vomiting  A & P   Acute abdominal pain, presumed acute gallstone pancreatitis, improved given elevated lipase with elevated LFTs though CT abdomen shows normal pancreas morphology.  Lipase within normal limits, abdominal pain essentially resolved.  Upper quadrant ultrasound suggest possible early cholecystitis -Surgery consulted, recommending cholecystectomy now that pancreatitis is resolved, n.p.o. at midnight -Continue IV fluids -Continue pain management -Liquid diet currently   Elevated LFTs. Bilirubin slightly elevated at 1.3. Right upper quadrant ultrasound shows no biliary duct dilatation but possible early signs of cholecystitis -Surgery consulted as mentioned above -Serial CMP  History of prostate cancer no acute concerns  History of CVA without neurologic deficits -Continue aspirin, atorvastatin     Family Communication  : None  Code Status : Full  Disposition Plan   :  Patient is from home. Anticipated d/c date:  24 to 48 hours. Barriers to d/c or necessity for inpatient status:  Patient to undergo cholecystectomy on 5/17 Consults  : Surgery  Procedures  : None DVT Prophylaxis  :  Lovenox   Lab Results  Component Value Date   PLT 151 01/04/2020    Diet :  Diet Order            Diet NPO time specified  Diet effective midnight        Diet clear liquid Room service appropriate? Yes; Fluid consistency: Thin  Diet effective now               Inpatient Medications Scheduled Meds: . aspirin  81 mg Oral Daily  . atorvastatin  40 mg Oral Daily  . enoxaparin (LOVENOX) injection  40 mg Subcutaneous Q24H  . polyethylene glycol  17 g Oral Daily  . senna-docusate  2 tablet Oral BID   Continuous Infusions: . sodium chloride 100 mL/hr at 01/04/20 0945   PRN Meds:.acetaminophen **OR** acetaminophen, diphenhydrAMINE, HYDROcodone-acetaminophen, morphine injection, ondansetron **OR** ondansetron (ZOFRAN) IV  Antibiotics  :   Anti-infectives (From admission, onward)   None       Objective   Vitals:   01/03/20 2350 01/04/20 0416 01/04/20 0811 01/04/20 1219  BP: (!) 100/58 116/64 (!) 97/54 (!) 109/56  Pulse: 82 87 65 66  Resp: 15 17 14 12   Temp: 98.8 F (37.1 C) 98.8 F (37.1 C) 97.8 F (36.6 C)   TempSrc: Oral Oral Oral   SpO2: 90% 94% (!) 88% 96%  Weight:      Height:        SpO2: 96 %  Wt Readings from Last 3 Encounters:  01/02/20 74.8 kg  07/30/19 79.7  kg  06/23/19 79.6 kg     Intake/Output Summary (Last 24 hours) at 01/04/2020 1631 Last data filed at 01/04/2020 0945 Gross per 24 hour  Intake 3283.53 ml  Output 550 ml  Net 2733.53 ml    Physical Exam:     Awake Alert, Oriented X 3, Normal affect No new F.N deficits,  [place].AT, Normal respiratory effort on room air, CTAB RRR,No Gallops,Rubs or new Murmurs,  +ve B.Sounds, Abd Soft, No tenderness, No rebound, guarding or rigidity. No Cyanosis, No new Rash or bruise       I have personally reviewed the following:   Data Reviewed:  CBC Recent Labs  Lab 01/02/20 2018 01/03/20 0406 01/04/20 0554  WBC 9.8 14.1* 9.3  HGB 15.4 13.5 12.4*  HCT 45.9 39.9 36.8*  PLT 206 174 151  MCV 91.1 90.3 90.4  MCH 30.6 30.5 30.5  MCHC 33.6 33.8 33.7  RDW 13.6 13.4 14.1    Chemistries  Recent Labs  Lab 01/02/20 2018 01/03/20 0406 01/04/20 0554  NA 139  --  138  K 4.3  --  4.1  CL 105  --  108  CO2 26  --  25  GLUCOSE 142*  --  100*  BUN 24*  --  20  CREATININE 0.83 0.85 0.84  CALCIUM 9.2  --  8.0*  AST  --  175* 54*  ALT  --  193* 108*  ALKPHOS  --  88 68  BILITOT  --  1.3* 1.4*   ------------------------------------------------------------------------------------------------------------------ Recent Labs    01/03/20 0406  TRIG 32    Lab Results  Component Value Date   HGBA1C 6.0 (H) 06/18/2019   ------------------------------------------------------------------------------------------------------------------ No results for input(s): TSH, T4TOTAL, T3FREE, THYROIDAB in the last 72 hours.  Invalid input(s): FREET3 ------------------------------------------------------------------------------------------------------------------ No results for input(s): VITAMINB12, FOLATE, FERRITIN, TIBC, IRON, RETICCTPCT in the last 72 hours.  Coagulation profile No results for input(s): INR, PROTIME in the last 168 hours.  No results for input(s): DDIMER in the last 72 hours.  Cardiac Enzymes No results for input(s): CKMB, TROPONINI, MYOGLOBIN in the last 168 hours.  Invalid input(s): CK ------------------------------------------------------------------------------------------------------------------ No results found for: BNP  Micro Results Recent Results (from the past 240 hour(s))  SARS Coronavirus 2 by RT PCR (hospital order, performed in [facility] hospital lab) Nasopharyngeal Nasopharyngeal Swab     Status: None   Collection Time: 01/03/20   4:19 AM   Specimen: Nasopharyngeal Swab  Result Value Ref Range Status   SARS Coronavirus 2 NEGATIVE NEGATIVE Final    Comment: (NOTE) SARS-CoV-2 target nucleic acids are NOT DETECTED. The SARS-CoV-2 RNA is generally detectable in upper and lower respiratory specimens during the acute phase of infection. The lowest concentration of SARS-CoV-2 viral copies this assay can detect is 250 copies / mL. A negative result does not preclude SARS-CoV-2 infection and should not be used as the sole basis for treatment or other patient management decisions.  A negative result may occur with improper specimen collection / handling, submission of specimen other than nasopharyngeal swab, presence of viral mutation(s) within the areas targeted by this assay, and inadequate number of viral copies (<250 copies / mL). A negative result must be combined with clinical observations, patient history, and epidemiological information. Fact Sheet for Patients:   StrictlyIdeas.no Fact Sheet for Healthcare Providers: BankingDealers.co.za This test is not yet approved or cleared  by the [place] FDA and has been authorized for detection and/or diagnosis of SARS-CoV-2 by FDA under an Emergency Use  Authorization (EUA).  This EUA will remain in effect (meaning this test can be used) for the duration of the COVID-19 declaration under Section 564(b)(1) of the Act, 21 U.S.C. section 360bbb-3(b)(1), unless the authorization is terminated or revoked sooner. Performed at [facility], [address] [place], [place] [address]     Radiology Reports CT ABDOMEN PELVIS W CONTRAST  Result Date: 01/03/2020 CLINICAL DATA:  Abdominal distension epigastric pain EXAM: CT ABDOMEN AND PELVIS WITH CONTRAST TECHNIQUE: Multidetector CT imaging of the abdomen and pelvis was performed using the standard protocol following bolus administration of intravenous contrast.  CONTRAST:  19mL OMNIPAQUE IOHEXOL 300 MG/ML  SOLN COMPARISON:  CT 10/17/2016, MRI 01/08/2012 FINDINGS: Lower chest: Lung bases demonstrate no acute consolidation or effusion. Hepatobiliary: No calcified gallstone. No focal hepatic abnormality or biliary dilatation Pancreas: Unremarkable. No pancreatic ductal dilatation or surrounding inflammatory changes. Spleen: Normal in size without focal abnormality. Adrenals/Urinary Tract: Adrenal glands are normal. Bilateral kidney cysts. No hydronephrosis. The bladder is normal Stomach/Bowel: Stomach is within normal limits. No evidence of bowel wall thickening, distention, or inflammatory changes. Some fluid in the right colon. Status post appendectomy. Vascular/Lymphatic: Moderate aortic atherosclerosis without aneurysm. No suspicious adenopathy Reproductive: Status post prostatectomy. Other: Negative for free air or free fluid. Musculoskeletal: Scoliosis and degenerative changes of the spine. Previous right hip replacement. Lucent changes about the right femoral component at the trochanteric region. IMPRESSION: 1. No CT evidence for acute intra-abdominal or pelvic abnormality. 2. Bilateral renal cysts 3. Prior right hip replacement. Lucent changes about the right femoral component at the trochanter suggesting potential particle disease. Electronically Signed   By: Donavan Foil M.D.   On: 01/03/2020 01:37   US Abdomen Limited RUQ  Result Date: 01/03/2020 CLINICAL DATA:  Pancreatitis.  Elevated LFTs. EXAM: ULTRASOUND ABDOMEN LIMITED RIGHT UPPER QUADRANT COMPARISON:  None. FINDINGS: Gallbladder: Borderline gallbladder wall thickening, measuring 4 mm. Gallbladder is filled with sludge. No sonographic Murphy's sign elicited during the exam, per the sonographer. Common bile duct: Diameter: 5 mm Liver: No focal lesion identified. Within normal limits in parenchymal echogenicity. Portal vein is patent on color Doppler imaging with normal direction of blood flow towards the  liver. Other: None. IMPRESSION: 1. Gallbladder is filled with sludge. Borderline gallbladder wall thickening, suspicious for early/mild acute cholecystitis. 2. No bile duct dilatation. 3. Liver appears normal. Electronically Signed   By: Franki Cabot M.D.   On: 01/03/2020 09:03     Time Spent in minutes  30     Desiree Hane M.D on 01/04/2020 at 4:31 PM  To page go to [email or web address] - password [facility]

## 2020-01-04 NOTE — Progress Notes (Signed)
[place] Hospital Day(s): 0.   Post op day(s):  Marland Kitchen   Interval History: Patient seen and examined, no acute events or new complaints overnight. Patient reports feeling better today.  He reported the abdominal pain has resolved.  He still have headaches.  He denies nausea or vomiting.  He reports he is tolerating clear liquids.  There is no radiation.  There is no alleviating or aggravating factors.  Vital signs in last 24 hours: [min-max] current  Temp:  [97.8 F (36.6 C)-99.2 F (37.3 C)] 97.8 F (36.6 C) (05/16 0811) Pulse Rate:  [65-89] 66 (05/16 1219) Resp:  [12-17] 12 (05/16 1219) BP: (97-118)/(54-66) 109/56 (05/16 1219) SpO2:  [88 %-96 %] 96 % (05/16 1219)     Height: 5\' 8"  (172.7 cm) Weight: 74.8 kg BMI (Calculated): 25.09   Physical Exam:  Constitutional: alert, cooperative and no distress  Respiratory: breathing non-labored at rest  Cardiovascular: regular rate and sinus rhythm  Gastrointestinal: soft, non-tender, and non-distended  Labs:  CBC Latest Ref Rng & Units 01/04/2020 01/03/2020 01/02/2020  WBC 4.0 - 10.5 K/uL 9.3 14.1(H) 9.8  Hemoglobin 13.0 - 17.0 g/dL 12.4(L) 13.5 15.4  Hematocrit 39.0 - 52.0 % 36.8(L) 39.9 45.9  Platelets 150 - 400 K/uL 151 174 206   CMP Latest Ref Rng & Units 01/04/2020 01/03/2020 01/02/2020  Glucose 70 - 99 mg/dL 100(H) - 142(H)  BUN 8 - 23 mg/dL 20 - 24(H)  Creatinine 0.61 - 1.24 mg/dL 0.84 0.85 0.83  Sodium 135 - 145 mmol/L 138 - 139  Potassium 3.5 - 5.1 mmol/L 4.1 - 4.3  Chloride 98 - 111 mmol/L 108 - 105  CO2 22 - 32 mmol/L 25 - 26  Calcium 8.9 - 10.3 mg/dL 8.0(L) - 9.2  Total Protein 6.5 - 8.1 g/dL 5.3(L) 5.9(L) -  Total Bilirubin 0.3 - 1.2 mg/dL 1.4(H) 1.3(H) -  Alkaline Phos 38 - 126 U/L 68 88 -  AST 15 - 41 U/L 54(H) 175(H) -  ALT 0 - 44 U/L 108(H) 193(H) -    Imaging studies: No new pertinent imaging studies   Assessment/Plan:  79 y.o. male with gallstone pancreatitis, complicated by pertinent  comorbidities including CVA, GERD, right bundle branch block.  Patient with resolving myositis.  Lipase normalized.  Abdominal pain resolved.  Discussed with patient about recommendation of cholecystectomy.  Discussed with patient about the surgical procedure, the benefits and the risk.  Risks discussed including: Bleeding, infection, bile leak, injury to bile duct, and pain, injury to the intestines or adjacent organs.  He understood and agreed to proceed.  We will coordinate for surgery tomorrow.  Arnold Long, MD

## 2020-01-04 NOTE — H&P (View-Only) (Signed)
[place] Hospital Day(s): 0.   Post op day(s):  Marland Kitchen   Interval History: Patient seen and examined, no acute events or new complaints overnight. Patient reports feeling better today.  He reported the abdominal pain has resolved.  He still have headaches.  He denies nausea or vomiting.  He reports he is tolerating clear liquids.  There is no radiation.  There is no alleviating or aggravating factors.  Vital signs in last 24 hours: [min-max] current  Temp:  [97.8 F (36.6 C)-99.2 F (37.3 C)] 97.8 F (36.6 C) (05/16 0811) Pulse Rate:  [65-89] 66 (05/16 1219) Resp:  [12-17] 12 (05/16 1219) BP: (97-118)/(54-66) 109/56 (05/16 1219) SpO2:  [88 %-96 %] 96 % (05/16 1219)     Height: 5\' 8"  (172.7 cm) Weight: 74.8 kg BMI (Calculated): 25.09   Physical Exam:  Constitutional: alert, cooperative and no distress  Respiratory: breathing non-labored at rest  Cardiovascular: regular rate and sinus rhythm  Gastrointestinal: soft, non-tender, and non-distended  Labs:  CBC Latest Ref Rng & Units 01/04/2020 01/03/2020 01/02/2020  WBC 4.0 - 10.5 K/uL 9.3 14.1(H) 9.8  Hemoglobin 13.0 - 17.0 g/dL 12.4(L) 13.5 15.4  Hematocrit 39.0 - 52.0 % 36.8(L) 39.9 45.9  Platelets 150 - 400 K/uL 151 174 206   CMP Latest Ref Rng & Units 01/04/2020 01/03/2020 01/02/2020  Glucose 70 - 99 mg/dL 100(H) - 142(H)  BUN 8 - 23 mg/dL 20 - 24(H)  Creatinine 0.61 - 1.24 mg/dL 0.84 0.85 0.83  Sodium 135 - 145 mmol/L 138 - 139  Potassium 3.5 - 5.1 mmol/L 4.1 - 4.3  Chloride 98 - 111 mmol/L 108 - 105  CO2 22 - 32 mmol/L 25 - 26  Calcium 8.9 - 10.3 mg/dL 8.0(L) - 9.2  Total Protein 6.5 - 8.1 g/dL 5.3(L) 5.9(L) -  Total Bilirubin 0.3 - 1.2 mg/dL 1.4(H) 1.3(H) -  Alkaline Phos 38 - 126 U/L 68 88 -  AST 15 - 41 U/L 54(H) 175(H) -  ALT 0 - 44 U/L 108(H) 193(H) -    Imaging studies: No new pertinent imaging studies   Assessment/Plan:  79 y.o. male with gallstone pancreatitis, complicated by pertinent  comorbidities including CVA, GERD, right bundle branch block.  Patient with resolving myositis.  Lipase normalized.  Abdominal pain resolved.  Discussed with patient about recommendation of cholecystectomy.  Discussed with patient about the surgical procedure, the benefits and the risk.  Risks discussed including: Bleeding, infection, bile leak, injury to bile duct, and pain, injury to the intestines or adjacent organs.  He understood and agreed to proceed.  We will coordinate for surgery tomorrow.  Arnold Long, MD

## 2020-01-04 NOTE — Plan of Care (Signed)
Patient doing well today.  Complaint of headache this morning that was relieved with  Tylenol.  No nausea/vomiting.  He has been up moving around in the room and sat up in the chair for a couple hours.  Plan is for surgery tomorrow.  No significant changes.

## 2020-01-05 ENCOUNTER — Encounter
Admission: EM | Disposition: A | Discharge status: Home / Self Care | Payer: Self-pay | Source: Home / Self Care | Attending: Internal Medicine

## 2020-01-05 ENCOUNTER — Other Ambulatory Visit: Payer: Self-pay

## 2020-01-05 ENCOUNTER — Inpatient Hospital Stay: Payer: Medicare Other | Admitting: Anesthesiology

## 2020-01-05 DIAGNOSIS — K858 Other acute pancreatitis without necrosis or infection: Secondary | ICD-10-CM

## 2020-01-05 LAB — COMPREHENSIVE METABOLIC PANEL
ALT: 76 U/L — ABNORMAL HIGH (ref 0–44)
AST: 28 U/L (ref 15–41)
Albumin: 3.1 g/dL — ABNORMAL LOW (ref 3.5–5.0)
Alkaline Phosphatase: 65 U/L (ref 38–126)
Anion gap: 7 (ref 5–15)
BUN: 13 mg/dL (ref 8–23)
CO2: 23 mmol/L (ref 22–32)
Calcium: 7.9 mg/dL — ABNORMAL LOW (ref 8.9–10.3)
Chloride: 106 mmol/L (ref 98–111)
Creatinine, Ser: 0.55 mg/dL — ABNORMAL LOW (ref 0.61–1.24)
GFR calc Af Amer: >60 mL/min (ref >60)
GFR calc non Af Amer: >60 mL/min (ref >60)
Glucose, Bld: 97 mg/dL (ref 70–99)
Potassium: 3.9 mmol/L (ref 3.5–5.1)
Sodium: 136 mmol/L (ref 135–145)
Total Bilirubin: 1.1 mg/dL (ref 0.3–1.2)
Total Protein: 5.5 g/dL — ABNORMAL LOW (ref 6.5–8.1)

## 2020-01-05 LAB — MRSA PCR SCREENING: MRSA by PCR: NEGATIVE

## 2020-01-05 SURGERY — CHOLECYSTECTOMY, ROBOT-ASSISTED, LAPAROSCOPIC
Anesthesia: General

## 2020-01-05 MED ORDER — SUGAMMADEX SODIUM 200 MG/2ML IV SOLN
INTRAVENOUS | Status: DC | PRN
  Administered 2020-01-05: 200 mg via INTRAVENOUS

## 2020-01-05 MED ORDER — ONDANSETRON HCL 4 MG/2ML IJ SOLN
INTRAMUSCULAR | Status: AC
  Filled 2020-01-05: qty 2

## 2020-01-05 MED ORDER — ASPIRIN-ACETAMINOPHEN-CAFFEINE 250-250-65 MG PO TABS
1.0000 | ORAL_TABLET | Freq: Four times a day (QID) | ORAL | Status: DC | PRN
  Filled 2020-01-05: qty 1

## 2020-01-05 MED ORDER — PROMETHAZINE HCL 25 MG/ML IJ SOLN: 6.2500 mg | INTRAMUSCULAR | Status: DC | PRN

## 2020-01-05 MED ORDER — ROCURONIUM BROMIDE 10 MG/ML (PF) SYRINGE
PREFILLED_SYRINGE | INTRAVENOUS | Status: AC
  Filled 2020-01-05: qty 50

## 2020-01-05 MED ORDER — FENTANYL CITRATE (PF) 100 MCG/2ML IJ SOLN
INTRAMUSCULAR | Status: AC
  Administered 2020-01-05: 25 ug via INTRAVENOUS
  Filled 2020-01-05: qty 2

## 2020-01-05 MED ORDER — CEFAZOLIN SODIUM-DEXTROSE 2-4 GM/100ML-% IV SOLN
2.0000 g | Freq: Once | INTRAVENOUS | Status: AC
  Administered 2020-01-05: 2 g via INTRAVENOUS

## 2020-01-05 MED ORDER — PROPOFOL 10 MG/ML IV BOLUS
INTRAVENOUS | Status: AC
  Filled 2020-01-05: qty 20

## 2020-01-05 MED ORDER — SODIUM CHLORIDE 0.9 % IV SOLN: INTRAVENOUS | Status: DC

## 2020-01-05 MED ORDER — OXYCODONE HCL 5 MG/5ML PO SOLN: 5.0000 mg | Freq: Once | ORAL | Status: DC | PRN

## 2020-01-05 MED ORDER — SUCCINYLCHOLINE CHLORIDE 200 MG/10ML IV SOSY
PREFILLED_SYRINGE | INTRAVENOUS | Status: AC
  Filled 2020-01-05: qty 10

## 2020-01-05 MED ORDER — DEXAMETHASONE SODIUM PHOSPHATE 10 MG/ML IJ SOLN
INTRAMUSCULAR | Status: AC
  Filled 2020-01-05: qty 1

## 2020-01-05 MED ORDER — LACTATED RINGERS IV SOLN: INTRAVENOUS | Status: DC

## 2020-01-05 MED ORDER — MEPERIDINE HCL 50 MG/ML IJ SOLN: 6.2500 mg | INTRAMUSCULAR | Status: DC | PRN

## 2020-01-05 MED ORDER — FENTANYL CITRATE (PF) 100 MCG/2ML IJ SOLN
INTRAMUSCULAR | Status: DC | PRN
  Administered 2020-01-05 (×2): 50 ug via INTRAVENOUS

## 2020-01-05 MED ORDER — ACETAMINOPHEN 10 MG/ML IV SOLN
INTRAVENOUS | Status: DC | PRN
  Administered 2020-01-05: 1000 mg via INTRAVENOUS

## 2020-01-05 MED ORDER — LIDOCAINE HCL (PF) 2 % IJ SOLN
INTRAMUSCULAR | Status: AC
  Filled 2020-01-05: qty 5

## 2020-01-05 MED ORDER — LIDOCAINE HCL (CARDIAC) PF 100 MG/5ML IV SOSY
PREFILLED_SYRINGE | INTRAVENOUS | Status: DC | PRN
  Administered 2020-01-05: 100 mg via INTRAVENOUS

## 2020-01-05 MED ORDER — CEFAZOLIN SODIUM-DEXTROSE 2-4 GM/100ML-% IV SOLN
INTRAVENOUS | Status: AC
  Filled 2020-01-05: qty 100

## 2020-01-05 MED ORDER — PROPOFOL 10 MG/ML IV BOLUS
INTRAVENOUS | Status: DC | PRN
  Administered 2020-01-05: 140 mg via INTRAVENOUS

## 2020-01-05 MED ORDER — BUPIVACAINE-EPINEPHRINE 0.25% -1:200000 IJ SOLN
INTRAMUSCULAR | Status: DC | PRN
  Administered 2020-01-05: 30 mL

## 2020-01-05 MED ORDER — DEXAMETHASONE SODIUM PHOSPHATE 10 MG/ML IJ SOLN
INTRAMUSCULAR | Status: DC | PRN
  Administered 2020-01-05: 8 mg via INTRAVENOUS

## 2020-01-05 MED ORDER — INDOCYANINE GREEN 25 MG IV SOLR
1.2500 mg | Freq: Once | INTRAVENOUS | Status: AC
  Administered 2020-01-05: 1.25 mg via INTRAVENOUS
  Filled 2020-01-05: qty 10

## 2020-01-05 MED ORDER — LIDOCAINE HCL (PF) 2 % IJ SOLN
INTRAMUSCULAR | Status: AC
  Filled 2020-01-05: qty 10

## 2020-01-05 MED ORDER — ACETAMINOPHEN 10 MG/ML IV SOLN
INTRAVENOUS | Status: AC
  Filled 2020-01-05: qty 100

## 2020-01-05 MED ORDER — ONDANSETRON HCL 4 MG/2ML IJ SOLN
INTRAMUSCULAR | Status: DC | PRN
  Administered 2020-01-05: 4 mg via INTRAVENOUS

## 2020-01-05 MED ORDER — ROCURONIUM BROMIDE 100 MG/10ML IV SOLN
INTRAVENOUS | Status: DC | PRN
  Administered 2020-01-05: 50 mg via INTRAVENOUS
  Administered 2020-01-05: 20 mg via INTRAVENOUS

## 2020-01-05 MED ORDER — FENTANYL CITRATE (PF) 100 MCG/2ML IJ SOLN
INTRAMUSCULAR | Status: AC
  Filled 2020-01-05: qty 2

## 2020-01-05 MED ORDER — FENTANYL CITRATE (PF) 100 MCG/2ML IJ SOLN
25.0000 ug | INTRAMUSCULAR | Status: DC | PRN
  Administered 2020-01-05: 25 ug via INTRAVENOUS

## 2020-01-05 MED ORDER — BUPIVACAINE-EPINEPHRINE (PF) 0.25% -1:200000 IJ SOLN
INTRAMUSCULAR | Status: AC
  Filled 2020-01-05: qty 30

## 2020-01-05 MED ORDER — OXYCODONE HCL 5 MG PO TABS: 5.0000 mg | ORAL_TABLET | Freq: Once | ORAL | Status: DC | PRN

## 2020-01-05 SURGICAL SUPPLY — 54 items
BAG INFUSER PRESSURE 100CC (MISCELLANEOUS) IMPLANT
BLADE SURG SZ11 CARB STEEL (BLADE) ×3 IMPLANT
CANISTER SUCT 1200ML W/VALVE (MISCELLANEOUS) ×3 IMPLANT
CANNULA REDUC XI 12-8 STAPL (CANNULA) ×2
CANNULA REDUC XI 12-8MM STAPL (CANNULA) ×1
CANNULA REDUCER 12-8 DVNC XI (CANNULA) ×1 IMPLANT
CHLORAPREP W/TINT 26 (MISCELLANEOUS) ×3 IMPLANT
CLIP VESOLOCK MED LG 6/CT (CLIP) ×3 IMPLANT
COVER WAND RF STERILE (DRAPES) ×3 IMPLANT
DECANTER SPIKE VIAL GLASS SM (MISCELLANEOUS) ×6 IMPLANT
DEFOGGER SCOPE WARMER CLEARIFY (MISCELLANEOUS) ×3 IMPLANT
DERMABOND ADVANCED (GAUZE/BANDAGES/DRESSINGS) ×2
DERMABOND ADVANCED .7 DNX12 (GAUZE/BANDAGES/DRESSINGS) ×1 IMPLANT
DRAPE ARM DVNC X/XI (DISPOSABLE) ×4 IMPLANT
DRAPE COLUMN DVNC XI (DISPOSABLE) ×1 IMPLANT
DRAPE DA VINCI XI ARM (DISPOSABLE) ×12
DRAPE DA VINCI XI COLUMN (DISPOSABLE) ×3
ELECT REM PT RETURN 9FT ADLT (ELECTROSURGICAL) ×3
ELECTRODE REM PT RTRN 9FT ADLT (ELECTROSURGICAL) ×1 IMPLANT
GLOVE BIO SURGEON STRL SZ 6.5 (GLOVE) ×4 IMPLANT
GLOVE BIO SURGEONS STRL SZ 6.5 (GLOVE) ×2
GLOVE BIOGEL PI IND STRL 6.5 (GLOVE) ×2 IMPLANT
GLOVE BIOGEL PI INDICATOR 6.5 (GLOVE) ×4
GOWN STRL REUS W/ TWL LRG LVL3 (GOWN DISPOSABLE) ×3 IMPLANT
GOWN STRL REUS W/TWL LRG LVL3 (GOWN DISPOSABLE) ×9
GRASPER SUT TROCAR 14GX15 (MISCELLANEOUS) IMPLANT
IRRIGATOR SUCT 8 DISP DVNC XI (IRRIGATION / IRRIGATOR) IMPLANT
IRRIGATOR SUCTION 8MM XI DISP (IRRIGATION / IRRIGATOR)
IV NS 1000ML (IV SOLUTION)
IV NS 1000ML BAXH (IV SOLUTION) IMPLANT
KIT PINK PAD W/HEAD ARE REST (MISCELLANEOUS) ×3
KIT PINK PAD W/HEAD ARM REST (MISCELLANEOUS) ×1 IMPLANT
LABEL OR SOLS (LABEL) ×3 IMPLANT
NDL INSUFFLATION 14GA 120MM (NEEDLE) ×1 IMPLANT
NEEDLE HYPO 22GX1.5 SAFETY (NEEDLE) ×3 IMPLANT
NEEDLE INSUFFLATION 14GA 120MM (NEEDLE) ×3 IMPLANT
NS IRRIG 500ML POUR BTL (IV SOLUTION) ×3 IMPLANT
OBTURATOR OPTICAL STANDARD 8MM (TROCAR) ×3
OBTURATOR OPTICAL STND 8 DVNC (TROCAR) ×1
OBTURATOR OPTICALSTD 8 DVNC (TROCAR) ×1 IMPLANT
PACK LAP CHOLECYSTECTOMY (MISCELLANEOUS) ×3 IMPLANT
POUCH SPECIMEN RETRIEVAL 10MM (ENDOMECHANICALS) ×3 IMPLANT
SEAL CANN UNIV 5-8 DVNC XI (MISCELLANEOUS) ×3 IMPLANT
SEAL XI 5MM-8MM UNIVERSAL (MISCELLANEOUS) ×9
SET TUBE SMOKE EVAC HIGH FLOW (TUBING) ×3 IMPLANT
SOLUTION ELECTROLUBE (MISCELLANEOUS) ×3 IMPLANT
STAPLER CANNULA SEAL DVNC XI (STAPLE) ×1 IMPLANT
STAPLER CANNULA SEAL XI (STAPLE) ×3
SUT MNCRL 4-0 (SUTURE) ×3
SUT MNCRL 4-0 27XMFL (SUTURE) ×1
SUT VIC AB 3-0 SH 27 (SUTURE)
SUT VIC AB 3-0 SH 27X BRD (SUTURE) IMPLANT
SUT VICRYL 0 AB UR-6 (SUTURE) ×2 IMPLANT
SUTURE MNCRL 4-0 27XMF (SUTURE) ×1 IMPLANT

## 2020-01-05 NOTE — Anesthesia Postprocedure Evaluation (Signed)
Anesthesia Post Note  Patient: Scott [name]  Procedure(s) Performed: XI ROBOTIC ASSISTED LAPAROSCOPIC CHOLECYSTECTOMY (N/A )  Patient location during evaluation: PACU Anesthesia Type: General Level of consciousness: awake and alert and oriented Pain management: pain level controlled Vital Signs Assessment: post-procedure vital signs reviewed and stable Respiratory status: spontaneous breathing, nonlabored ventilation and respiratory function stable Cardiovascular status: blood pressure returned to baseline and stable Postop Assessment: no signs of nausea or vomiting Anesthetic complications: no     Last Vitals:  Vitals:   01/05/20 1254 01/05/20 1330  BP: 123/69 131/69  Pulse: 83 74  Resp: 17 18  Temp: 36.6 C 36.7 C  SpO2: 91% 93%    Last Pain:  Vitals:   01/05/20 1330  TempSrc: Oral  PainSc:                  Amy Penwarden

## 2020-01-05 NOTE — Transfer of Care (Signed)
Immediate Anesthesia Transfer of Care Note  Patient: Scott [name]  Procedure(s) Performed: XI ROBOTIC ASSISTED LAPAROSCOPIC CHOLECYSTECTOMY (N/A )  Patient Location: PACU  Anesthesia Type:General  Level of Consciousness: sedated  Airway & Oxygen Therapy: Patient Spontanous Breathing and Patient connected to face mask oxygen  Post-op Assessment: Report given to RN and Post -op Vital signs reviewed and stable  Post vital signs: Reviewed and stable  Last Vitals:  Vitals Value Taken Time  BP 132/54 01/05/20 1130  Temp 36.2 C 01/05/20 1130  Pulse 83 01/05/20 1130  Resp 12 01/05/20 1130  SpO2 99 % 01/05/20 1130  Vitals shown include unvalidated device data.  Last Pain:  Vitals:   01/05/20 1130  TempSrc:   PainSc: 0-No pain      Patients Stated Pain Goal: 0 ([id number])  Complications: No apparent anesthesia complications

## 2020-01-05 NOTE — Progress Notes (Signed)
[facility]  PROGRESS NOTE  [name] [id number] DOB: Jun 06, 1941 DOA: 01/02/2020 PCP: Owens Loffler, MD Admit date - 01/02/2020   Admitting Physician Desiree Hane, MD  Outpatient Primary MD for the patient is Owens Loffler, MD  LOS - 1 Brief Narrative   Mr. Scott [name] is a 79 year old male with medical history significant for prostate cancer, history of CVA who presented to ED on 01/02/2020 with sudden onset epigastric abdominal pain with associated nausea and no vomiting after eating lunch. Patient was found to have elevated lipase at 1178, CT abdomen showed no acute abnormalities including no findings involving the pancreas. Patient admitted with working diagnosis of acute gallstone pancreatitis.  Right upper quadrant ultrasound obtained for further evaluation concerning for early development of cholecystitis.  Surgery was consulted for further assistance    Subjective  Today reports only minimal abdominal pain after recent procedure.  No nausea or vomiting.  Tolerating liquid diet.  A & P   Acute abdominal pain, secondary to acute gallstone pancreatitis,.  Elevated lipase resolved, elevated LFTs resolved.  Status post laparoscopic cholecystectomy on 5/17. -Clear liquid diet -Monitor CMP, CBC in a.m.  -Continue IV fluids -Continue pain management -Surgery following   Elevated LFTs. Bilirubin slightly elevated at 1.3. Right upper quadrant ultrasound shows no biliary duct dilatation but possible early signs of cholecystitis -Surgery consulted as mentioned above -Serial CMP  History of prostate cancer no acute concerns  History of CVA without neurologic deficits -Continue aspirin, atorvastatin     Family Communication  : Family updated at bedside  Code Status : Full  Disposition Plan  :  Patient is from home. Anticipated d/c date:  24 to 48 hours. Barriers to d/c or necessity for inpatient status:  Monitor ability to tolerate diet without  pain/nausea/vomiting Consults  : Surgery  Procedures  : Laparoscopic cholecystectomy, 5/17 DVT Prophylaxis  :  Lovenox   Lab Results  Component Value Date   PLT 151 01/04/2020    Diet :  Diet Order            Diet full liquid Room service appropriate? Yes; Fluid consistency: Thin  Diet effective now               Inpatient Medications Scheduled Meds: . aspirin  81 mg Oral Daily  . atorvastatin  40 mg Oral Daily  . enoxaparin (LOVENOX) injection  40 mg Subcutaneous Q24H  . polyethylene glycol  17 g Oral Daily  . senna-docusate  2 tablet Oral BID   Continuous Infusions: . sodium chloride 100 mL/hr at 01/05/20 0449  . lactated ringers 800 mL/hr at 01/05/20 1130   PRN Meds:.acetaminophen **OR** acetaminophen, aspirin-acetaminophen-caffeine, diphenhydrAMINE, HYDROcodone-acetaminophen, morphine injection, ondansetron **OR** ondansetron (ZOFRAN) IV  Antibiotics  :   Anti-infectives (From admission, onward)   Start     Dose/Rate Route Frequency Ordered Stop   01/05/20 0915  ceFAZolin (ANCEF) IVPB 2g/100 mL premix     2 g 200 mL/hr over 30 Minutes Intravenous  Once 01/05/20 0909 01/05/20 1033   01/05/20 0910  ceFAZolin (ANCEF) 2-4 GM/100ML-% IVPB    Note to Pharmacy: Register, Karen   : cabinet override      01/05/20 0910 01/05/20 1014       Objective   Vitals:   01/05/20 1224 01/05/20 1254 01/05/20 1330 01/05/20 1430  BP: 125/65 123/69 131/69 (!) 141/71  Pulse: 82 83 74 82  Resp: 11 17 18 18   Temp: 98.6 F (37 C) 97.9 F (36.6 C) 98  F (36.7 C) 97.8 F (36.6 C)  TempSrc:  Oral Oral Oral  SpO2: 92% 91% 93% 96%  Weight:      Height:        SpO2: 96 % O2 Flow Rate (L/min): 6 L/min  Wt Readings from Last 3 Encounters:  01/05/20 73.5 kg  07/30/19 79.7 kg  06/23/19 79.6 kg     Intake/Output Summary (Last 24 hours) at 01/05/2020 1908 Last data filed at 01/05/2020 1900 Gross per 24 hour  Intake 440 ml  Output 755 ml  Net -315 ml    Physical  Exam:     Awake Alert, Oriented X 3, Normal affect No new F.N deficits,  Altus.AT, Normal respiratory effort on room air, CTAB RRR,No Gallops,Rubs or new Murmurs,  +ve B.Sounds, Abd Soft, No tenderness, No rebound, guarding or rigidity.  Laparoscopic incisions clean, dry, intact in lower abdomen No Cyanosis, No new Rash or bruise     I have personally reviewed the following:   Data Reviewed:  CBC Recent Labs  Lab 01/02/20 2018 01/03/20 0406 01/04/20 0554  WBC 9.8 14.1* 9.3  HGB 15.4 13.5 12.4*  HCT 45.9 39.9 36.8*  PLT 206 174 151  MCV 91.1 90.3 90.4  MCH 30.6 30.5 30.5  MCHC 33.6 33.8 33.7  RDW 13.6 13.4 14.1    Chemistries  Recent Labs  Lab 01/02/20 2018 01/03/20 0406 01/04/20 0554 01/05/20 0530  NA 139  --  138 136  K 4.3  --  4.1 3.9  CL 105  --  108 106  CO2 26  --  25 23  GLUCOSE 142*  --  100* 97  BUN 24*  --  20 13  CREATININE 0.83 0.85 0.84 0.55*  CALCIUM 9.2  --  8.0* 7.9*  AST  --  175* 54* 28  ALT  --  193* 108* 76*  ALKPHOS  --  88 68 65  BILITOT  --  1.3* 1.4* 1.1   ------------------------------------------------------------------------------------------------------------------ Recent Labs    01/03/20 0406  TRIG 32    Lab Results  Component Value Date   HGBA1C 6.0 (H) 06/18/2019   ------------------------------------------------------------------------------------------------------------------ No results for input(s): TSH, T4TOTAL, T3FREE, THYROIDAB in the last 72 hours.  Invalid input(s): FREET3 ------------------------------------------------------------------------------------------------------------------ No results for input(s): VITAMINB12, FOLATE, FERRITIN, TIBC, IRON, RETICCTPCT in the last 72 hours.  Coagulation profile No results for input(s): INR, PROTIME in the last 168 hours.  No results for input(s): DDIMER in the last 72 hours.  Cardiac Enzymes No results for input(s): CKMB, TROPONINI, MYOGLOBIN in the last 168  hours.  Invalid input(s): CK ------------------------------------------------------------------------------------------------------------------ No results found for: BNP  Micro Results Recent Results (from the past 240 hour(s))  SARS Coronavirus 2 by RT PCR (hospital order, performed in [facility] hospital lab) Nasopharyngeal Nasopharyngeal Swab     Status: None   Collection Time: 01/03/20  4:19 AM   Specimen: Nasopharyngeal Swab  Result Value Ref Range Status   SARS Coronavirus 2 NEGATIVE NEGATIVE Final    Comment: (NOTE) SARS-CoV-2 target nucleic acids are NOT DETECTED. The SARS-CoV-2 RNA is generally detectable in upper and lower respiratory specimens during the acute phase of infection. The lowest concentration of SARS-CoV-2 viral copies this assay can detect is 250 copies / mL. A negative result does not preclude SARS-CoV-2 infection and should not be used as the sole basis for treatment or other patient management decisions.  A negative result may occur with improper specimen collection / handling, submission of specimen other than nasopharyngeal  swab, presence of viral mutation(s) within the areas targeted by this assay, and inadequate number of viral copies (<250 copies / mL). A negative result must be combined with clinical observations, patient history, and epidemiological information. Fact Sheet for Patients:   StrictlyIdeas.no Fact Sheet for Healthcare Providers: BankingDealers.co.za This test is not yet approved or cleared  by the [place] FDA and has been authorized for detection and/or diagnosis of SARS-CoV-2 by FDA under an Emergency Use Authorization (EUA).  This EUA will remain in effect (meaning this test can be used) for the duration of the COVID-19 declaration under Section 564(b)(1) of the Act, 21 U.S.C. section 360bbb-3(b)(1), unless the authorization is terminated or revoked sooner. Performed at [facility], [address] [place], [place] [address]   MRSA PCR Screening     Status: None   Collection Time: 01/05/20  4:51 AM   Specimen: Nasal Mucosa; Nasopharyngeal  Result Value Ref Range Status   MRSA by PCR NEGATIVE NEGATIVE Final    Comment:        The GeneXpert MRSA Assay (FDA approved for NASAL specimens only), is one component of a comprehensive MRSA colonization surveillance program. It is not intended to diagnose MRSA infection nor to guide or monitor treatment for MRSA infections. Performed at [facility], [address] [place], [place] [address]     Radiology Reports CT ABDOMEN PELVIS W CONTRAST  Result Date: 01/03/2020 CLINICAL DATA:  Abdominal distension epigastric pain EXAM: CT ABDOMEN AND PELVIS WITH CONTRAST TECHNIQUE: Multidetector CT imaging of the abdomen and pelvis was performed using the standard protocol following bolus administration of intravenous contrast. CONTRAST:  116mL OMNIPAQUE IOHEXOL 300 MG/ML  SOLN COMPARISON:  CT 10/17/2016, MRI 01/08/2012 FINDINGS: Lower chest: Lung bases demonstrate no acute consolidation or effusion. Hepatobiliary: No calcified gallstone. No focal hepatic abnormality or biliary dilatation Pancreas: Unremarkable. No pancreatic ductal dilatation or surrounding inflammatory changes. Spleen: Normal in size without focal abnormality. Adrenals/Urinary Tract: Adrenal glands are normal. Bilateral kidney cysts. No hydronephrosis. The bladder is normal Stomach/Bowel: Stomach is within normal limits. No evidence of bowel wall thickening, distention, or inflammatory changes. Some fluid in the right colon. Status post appendectomy. Vascular/Lymphatic: Moderate aortic atherosclerosis without aneurysm. No suspicious adenopathy Reproductive: Status post prostatectomy. Other: Negative for free air or free fluid. Musculoskeletal: Scoliosis and degenerative changes of the spine. Previous right hip replacement. Lucent changes about the  right femoral component at the trochanteric region. IMPRESSION: 1. No CT evidence for acute intra-abdominal or pelvic abnormality. 2. Bilateral renal cysts 3. Prior right hip replacement. Lucent changes about the right femoral component at the trochanter suggesting potential particle disease. Electronically Signed   By: Donavan Foil M.D.   On: 01/03/2020 01:37   US Abdomen Limited RUQ  Result Date: 01/03/2020 CLINICAL DATA:  Pancreatitis.  Elevated LFTs. EXAM: ULTRASOUND ABDOMEN LIMITED RIGHT UPPER QUADRANT COMPARISON:  None. FINDINGS: Gallbladder: Borderline gallbladder wall thickening, measuring 4 mm. Gallbladder is filled with sludge. No sonographic Murphy's sign elicited during the exam, per the sonographer. Common bile duct: Diameter: 5 mm Liver: No focal lesion identified. Within normal limits in parenchymal echogenicity. Portal vein is patent on color Doppler imaging with normal direction of blood flow towards the liver. Other: None. IMPRESSION: 1. Gallbladder is filled with sludge. Borderline gallbladder wall thickening, suspicious for early/mild acute cholecystitis. 2. No bile duct dilatation. 3. Liver appears normal. Electronically Signed   By: Franki Cabot M.D.   On: 01/03/2020 09:03     Time  Spent in minutes  30     Desiree Hane M.D on 01/05/2020 at 7:08 PM  To page go to [email or web address] - password [facility]

## 2020-01-05 NOTE — Interval H&P Note (Signed)
History and Physical Interval Note:  01/05/2020 8:51 AM  Scott [name]  has presented today for surgery, with the diagnosis of gallstone pancreatitis.  The various methods of treatment have been discussed with the patient and family. After consideration of risks, benefits and other options for treatment, the patient has consented to  Procedure(s): XI ROBOTIC [place] (N/A) as a surgical intervention.  The patient's history has been reviewed, patient examined, no change in status, stable for surgery.  I have reviewed the patient's chart and labs.  Questions were answered to the patient's satisfaction.     Herbert Pun

## 2020-01-05 NOTE — Anesthesia Procedure Notes (Signed)
Procedure Name: Intubation Date/Time: 01/05/2020 9:59 AM Performed by: Hedda Slade, CRNA Pre-anesthesia Checklist: Patient identified, Patient being monitored, Timeout performed, Emergency Drugs available and Suction available Patient Re-evaluated:Patient Re-evaluated prior to induction Oxygen Delivery Method: Circle system utilized Preoxygenation: Pre-oxygenation with 100% oxygen Induction Type: IV induction Ventilation: Mask ventilation without difficulty Laryngoscope Size: McGraph and 4 Grade View: Grade I Tube type: Oral Tube size: 7.5 mm Number of attempts: 1 Airway Equipment and Method: Stylet and Video-laryngoscopy Placement Confirmation: ETT inserted through vocal cords under direct vision,  positive ETCO2 and breath sounds checked- equal and bilateral Secured at: 21 cm Tube secured with: Tape Dental Injury: Teeth and Oropharynx as per pre-operative assessment

## 2020-01-05 NOTE — Anesthesia Preprocedure Evaluation (Signed)
Anesthesia Evaluation  Patient identified by MRN, date of birth, ID band Patient awake    Reviewed: Allergy & Precautions, NPO status , Patient's Chart, lab work & pertinent test results  History of Anesthesia Complications Negative for: history of anesthetic complications  Airway Mallampati: II  TM Distance: >3 FB Neck ROM: Full    Dental  (+) Poor Dentition   Pulmonary neg pulmonary ROS, neg sleep apnea, neg COPD,    breath sounds clear to auscultation- rhonchi (-) wheezing      Cardiovascular (-) hypertension(-) CAD, (-) Past MI, (-) Cardiac Stents and (-) CABG  Rhythm:Regular Rate:Normal - Systolic murmurs and - Diastolic murmurs    Neuro/Psych neg Seizures CVA, No Residual Symptoms negative psych ROS   GI/Hepatic Neg liver ROS, GERD  ,  Endo/Other  negative endocrine ROSneg diabetes  Renal/GU negative Renal ROS     Musculoskeletal  (+) Arthritis ,   Abdominal (+) - obese,   Peds  Hematology negative hematology ROS (+)   Anesthesia Other Findings Past Medical History: No date: Allergic rhinitis due to pollen No date: Arthritis 06/23/2019: 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) No date: GERD (gastroesophageal reflux disease) No date: Hyperlipidemia No date: Prostate cancer ([place])     Comment:  cancer No date: Right bundle branch block     Comment:  RBBB   Reproductive/Obstetrics                             Anesthesia Physical Anesthesia Plan  ASA: II  Anesthesia Plan: General   Post-op Pain Management:    Induction: Intravenous  PONV Risk Score and Plan: 1 and Ondansetron and Dexamethasone  Airway Management Planned: Oral ETT  Additional Equipment:   Intra-op Plan:   Post-operative Plan: Extubation in OR  Informed Consent: I have reviewed the patients History and Physical, chart, labs and discussed the procedure including the risks, benefits and  alternatives for the proposed anesthesia with the patient or authorized representative who has indicated his/her understanding and acceptance.     Dental advisory given  Plan Discussed with: CRNA and Anesthesiologist  Anesthesia Plan Comments:         Anesthesia Quick Evaluation

## 2020-01-05 NOTE — Op Note (Signed)
Preoperative diagnosis: Gallstone pancreatitis  Postoperative diagnosis: Gallstone pancreatitis  Procedure: Robotic Assisted Laparoscopic Cholecystectomy.   Anesthesia: GETA   Surgeon: Dr. Windell Moment  Wound Classification: Clean Contaminated  Indications: Patient is a 79 y.o. male developed right upper quadrant pain and on workup was found to have cholelithiasis with a normal common duct and biliary pancreatitis. Robotic Assisted Laparoscopic cholecystectomy was elected.  Findings: Distended gallbladder with pericholecystic fluid Critical view of safety achieved Cystic duct and artery identified, ligated and divided Adequate hemostasis  Description of procedure: The patient was placed on the operating table in the supine position. General anesthesia was induced. A time-out was completed verifying correct patient, procedure, site, positioning, and implant(s) and/or special equipment prior to beginning this procedure. An orogastric tube was placed. The abdomen was prepped and draped in the usual sterile fashion.  An incision was made in a natural skin line below the umbilicus.  The fascia was elevated and the Veress needle inserted. Proper position was confirmed by aspiration and saline meniscus test.  The abdomen was insufflated with carbon dioxide to a pressure of 15 mmHg. The patient tolerated insufflation well. A 8-mm trocar was then inserted in optiview fashion.  The laparoscope was inserted and the abdomen inspected. No injuries from initial trocar placement were noted. Additional trocars were then inserted in the following locations: an 8-mm trocar in the left lateral abdomen, and another two 8-mm trocars to the right side of the abdomen 5 cm appart. The umbilical trocar was changed to a 12 mm trocar all under direct visualization. The abdomen was inspected and no abnormalities were found. The table was placed in the reverse Trendelenburg position with the right side up. The robotic  arms were docked and target anatomy identified. Instrument inserted under direct visualization.  Filmy adhesions between the gallbladder and omentum, duodenum and transverse colon were lysed with electrocautery. The dome of the gallbladder was grasped with a prograsp and retracted over the dome of the liver. The infundibulum was also grasped with an atraumatic grasper and retracted toward the right lower quadrant. This maneuver exposed Calot's triangle. The peritoneum overlying the gallbladder infundibulum was then incised and the cystic duct and cystic artery identified and circumferentially dissected. Critical view of safety reviewed before ligating any structure. Firefly images taken to visualize biliary ducts. The cystic duct and cystic artery were then doubly clipped and divided close to the gallbladder.  The gallbladder was then dissected from its peritoneal attachments by electrocautery. Hemostasis was checked and the gallbladder and contained stones were removed using an endoscopic retrieval bag. The gallbladder was passed off the table as a specimen. The gallbladder fossa was copiously irrigated with saline and hemostasis was obtained. There was no evidence of bleeding from the gallbladder fossa or cystic artery or leakage of the bile from the cystic duct stump. Secondary trocars were removed under direct vision. No bleeding was noted. The robotic arms were undoked. The scope was withdrawn and the umbilical trocar removed. The abdomen was allowed to collapse. The fascia of the 29mm trocar sites was closed with figure-of-eight 0 vicryl sutures. The skin was closed with subcuticular sutures of 4-0 monocryl and topical skin adhesive. The orogastric tube was removed.  The patient tolerated the procedure well and was taken to the postanesthesia care unit in stable condition.   Specimen: Gallbladder  Complications: None  EBL: 5 mL

## 2020-01-06 ENCOUNTER — Telehealth: Payer: Self-pay

## 2020-01-06 LAB — CBC
HCT: 38.8 % — ABNORMAL LOW (ref 39.0–52.0)
Hemoglobin: 13 g/dL (ref 13.0–17.0)
MCH: 30.4 pg (ref 26.0–34.0)
MCHC: 33.5 g/dL (ref 30.0–36.0)
MCV: 90.9 fL (ref 80.0–100.0)
Platelets: 157 10*3/uL (ref 150–400)
RBC: 4.27 MIL/uL (ref 4.22–5.81)
RDW: 13.4 % (ref 11.5–15.5)
WBC: 5.4 10*3/uL (ref 4.0–10.5)
nRBC: 0 % (ref 0.0–0.2)

## 2020-01-06 LAB — COMPREHENSIVE METABOLIC PANEL
ALT: 80 U/L — ABNORMAL HIGH (ref 0–44)
AST: 48 U/L — ABNORMAL HIGH (ref 15–41)
Albumin: 3 g/dL — ABNORMAL LOW (ref 3.5–5.0)
Alkaline Phosphatase: 66 U/L (ref 38–126)
Anion gap: 5 (ref 5–15)
BUN: 12 mg/dL (ref 8–23)
CO2: 24 mmol/L (ref 22–32)
Calcium: 8.3 mg/dL — ABNORMAL LOW (ref 8.9–10.3)
Chloride: 109 mmol/L (ref 98–111)
Creatinine, Ser: 0.68 mg/dL (ref 0.61–1.24)
GFR calc Af Amer: >60 mL/min (ref >60)
GFR calc non Af Amer: >60 mL/min (ref >60)
Glucose, Bld: 127 mg/dL — ABNORMAL HIGH (ref 70–99)
Potassium: 4.1 mmol/L (ref 3.5–5.1)
Sodium: 138 mmol/L (ref 135–145)
Total Bilirubin: 0.7 mg/dL (ref 0.3–1.2)
Total Protein: 5.5 g/dL — ABNORMAL LOW (ref 6.5–8.1)

## 2020-01-06 LAB — SURGICAL PATHOLOGY

## 2020-01-06 MED ORDER — HYDROCODONE-ACETAMINOPHEN 5-325 MG PO TABS: 1.0000 | ORAL_TABLET | ORAL | 0 refills | Status: AC | PRN

## 2020-01-06 MED ORDER — ALUM & MAG HYDROXIDE-SIMETH 200-200-20 MG/5ML PO SUSP: 30.0000 mL | ORAL | Status: DC | PRN

## 2020-01-06 NOTE — Progress Notes (Signed)
[facility]  PROGRESS NOTE  [name] [id number] DOB: 1941/04/16 DOA: 01/02/2020 PCP: Owens Loffler, MD Admit date - 01/02/2020   Admitting Physician Desiree Hane, MD  Outpatient Primary MD for the patient is Owens Loffler, MD  LOS - 2 Brief Narrative   Mr. Scott [name] is a 79 year old male with medical history significant for prostate cancer, history of CVA who presented to ED on 01/02/2020 with sudden onset epigastric abdominal pain with associated nausea and no vomiting after eating lunch. Patient was found to have elevated lipase at 1178, CT abdomen showed no acute abnormalities including no findings involving the pancreas. Patient admitted with working diagnosis of acute gallstone pancreatitis.  Right upper quadrant ultrasound obtained for further evaluation concerning for early development of cholecystitis.  Surgery was consulted for further assistance    Subjective  Today tolerated tolerated  A & P   diet with Acute abdominal pain, secondary to acute gallstone pancreatitis,.  Elevate resolvedd lipase resolved, elevated LFTs resolved.  Status post laparoscopic cholecystectomy on 5/17. Doing well on doing well on diet   -Continue pain management, provided prescription for Norco by surgery on discharge -Agree medically stable for discharge   Elevated LFTs, improved. Bilirubin slightly elevated at 1.3. Right upper quadrant ultrasound shows no biliary duct dilatation.  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 Anticipated d/c date:  5/18. Barriers to d/c or necessity for inpatient status:  Discharge home today Consults  : Surgery  Procedures  : Laparoscopic  cholecystectomy, 5/17 DVT Prophylaxis  :  Lovenox   Lab Results  Component Value Date   PLT 157 01/06/2020    Diet :  Diet Order            Diet - low sodium heart healthy        Diet - low sodium heart healthy               Inpatient Medications Scheduled Meds:  Continuous Infusions:  PRN Meds:.  Antibiotics  :   Anti-infectives (From admission, onward)   Start     Dose/Rate Route Frequency Ordered Stop   01/05/20 0915  ceFAZolin (ANCEF) IVPB 2g/100 mL premix     2 g 200 mL/hr over 30 Minutes Intravenous  Once 01/05/20 0909 01/05/20 1033   01/05/20 0910  ceFAZolin (ANCEF) 2-4 GM/100ML-% IVPB    Note to Pharmacy: Register, Karen   : cabinet override      01/05/20 0910 01/05/20 1014       Objective   Vitals:   01/05/20 1430 01/05/20 2004 01/06/20 0359 01/06/20 0859  BP: (!) 141/71 122/79 (!) 135/92 125/71  Pulse: 82 66 64 88  Resp: 18 18 19 16   Temp: 97.8 F (36.6 C) 97.9 F (36.6 C) 97.7 F (36.5 C) 97.9 F (36.6 C)  TempSrc: Oral Oral  Oral  SpO2: 96% 93% 97% 98%  Weight:      Height:        SpO2: 98 % O2 Flow Rate (L/min): 6 L/min  Wt Readings from Last 3 Encounters:  01/05/20 73.5 kg  07/30/19 79.7 kg  06/23/19 79.6 kg     Intake/Output Summary (Last 24 hours) at 01/06/2020 2317 Last data filed  at 01/06/2020 0900 Gross per 24 hour  Intake 240 ml  Output --  Net 240 ml    Physical Exam:   Awake Alert, Oriented X 3, Normal affect No new F.N deficits,  [place].AT, Normal respiratory effort on room air, CTAB RRR,No Gallops,Rubs or new Murmurs,  +ve B.Sounds, Abd Soft, No tenderness, No rebound, guarding or rigidity.  Laparoscopic incisions clean, dry, intact in lower abdomen No Cyanosis, No new Rash or bruise     I have personally reviewed the following:   Data Reviewed:  CBC Recent Labs  Lab 01/02/20 2018 01/03/20 0406 01/04/20 0554 01/06/20 0441  WBC 9.8 14.1* 9.3 5.4  HGB 15.4 13.5 12.4* 13.0  HCT 45.9 39.9 36.8* 38.8*   PLT 206 174 151 157  MCV 91.1 90.3 90.4 90.9  MCH 30.6 30.5 30.5 30.4  MCHC 33.6 33.8 33.7 33.5  RDW 13.6 13.4 14.1 13.4    Chemistries  Recent Labs  Lab 01/02/20 2018 01/03/20 0406 01/04/20 0554 01/05/20 0530 01/06/20 0441  NA 139  --  138 136 138  K 4.3  --  4.1 3.9 4.1  CL 105  --  108 106 109  CO2 26  --  25 23 24   GLUCOSE 142*  --  100* 97 127*  BUN 24*  --  20 13 12   CREATININE 0.83 0.85 0.84 0.55* 0.68  CALCIUM 9.2  --  8.0* 7.9* 8.3*  AST  --  175* 54* 28 48*  ALT  --  193* 108* 76* 80*  ALKPHOS  --  88 68 65 66  BILITOT  --  1.3* 1.4* 1.1 0.7   ------------------------------------------------------------------------------------------------------------------ No results for input(s): CHOL, HDL, LDLCALC, TRIG, CHOLHDL, LDLDIRECT in the last 72 hours.  Lab Results  Component Value Date   HGBA1C 6.0 (H) 06/18/2019   ------------------------------------------------------------------------------------------------------------------ No results for input(s): TSH, T4TOTAL, T3FREE, THYROIDAB in the last 72 hours.  Invalid input(s): FREET3 ------------------------------------------------------------------------------------------------------------------ No results for input(s): VITAMINB12, FOLATE, FERRITIN, TIBC, IRON, RETICCTPCT in the last 72 hours.  Coagulation profile No results for input(s): INR, PROTIME in the last 168 hours.  No results for input(s): DDIMER in the last 72 hours.  Cardiac Enzymes No results for input(s): CKMB, TROPONINI, MYOGLOBIN in the last 168 hours.  Invalid input(s): CK ------------------------------------------------------------------------------------------------------------------ No results found for: BNP  Micro Results Recent Results (from the past 240 hour(s))  SARS Coronavirus 2 by RT PCR (hospital order, performed in [facility] hospital lab) Nasopharyngeal Nasopharyngeal Swab     Status: None   Collection Time: 01/03/20  4:19  AM   Specimen: Nasopharyngeal Swab  Result Value Ref Range Status   SARS Coronavirus 2 NEGATIVE NEGATIVE Final    Comment: (NOTE) SARS-CoV-2 target nucleic acids are NOT DETECTED. The SARS-CoV-2 RNA is generally detectable in upper and lower respiratory specimens during the acute phase of infection. The lowest concentration of SARS-CoV-2 viral copies this assay can detect is 250 copies / mL. A negative result does not preclude SARS-CoV-2 infection and should not be used as the sole basis for treatment or other patient management decisions.  A negative result may occur with improper specimen collection / handling, submission of specimen other than nasopharyngeal swab, presence of viral mutation(s) within the areas targeted by this assay, and inadequate number of viral copies (<250 copies / mL). A negative result must be combined with clinical observations, patient history, and epidemiological information. Fact Sheet for Patients:   StrictlyIdeas.no Fact Sheet for Healthcare Providers: BankingDealers.co.za This test is not  yet approved or cleared  by the [place] and has been authorized for detection and/or diagnosis of SARS-CoV-2 by FDA under an Emergency Use Authorization (EUA).  This EUA will remain in effect (meaning this test can be used) for the duration of the COVID-19 declaration under Section 564(b)(1) of the Act, 21 U.S.C. section 360bbb-3(b)(1), unless the authorization is terminated or revoked sooner. Performed at [facility], [address] [place], [place] [address]   MRSA PCR Screening     Status: None   Collection Time: 01/05/20  4:51 AM   Specimen: Nasal Mucosa; Nasopharyngeal  Result Value Ref Range Status   MRSA by PCR NEGATIVE NEGATIVE Final    Comment:        The GeneXpert MRSA Assay (FDA approved for NASAL specimens only), is one component of a comprehensive MRSA colonization surveillance  program. It is not intended to diagnose MRSA infection nor to guide or monitor treatment for MRSA infections. Performed at [facility], [address] [place], [place] [address]     Radiology Reports CT ABDOMEN PELVIS W CONTRAST  Result Date: 01/03/2020 CLINICAL DATA:  Abdominal distension epigastric pain EXAM: CT ABDOMEN AND PELVIS WITH CONTRAST TECHNIQUE: Multidetector CT imaging of the abdomen and pelvis was performed using the standard protocol following bolus administration of intravenous contrast. CONTRAST:  138mL OMNIPAQUE IOHEXOL 300 MG/ML  SOLN COMPARISON:  CT 10/17/2016, MRI 01/08/2012 FINDINGS: Lower chest: Lung bases demonstrate no acute consolidation or effusion. Hepatobiliary: No calcified gallstone. No focal hepatic abnormality or biliary dilatation Pancreas: Unremarkable. No pancreatic ductal dilatation or surrounding inflammatory changes. Spleen: Normal in size without focal abnormality. Adrenals/Urinary Tract: Adrenal glands are normal. Bilateral kidney cysts. No hydronephrosis. The bladder is normal Stomach/Bowel: Stomach is within normal limits. No evidence of bowel wall thickening, distention, or inflammatory changes. Some fluid in the right colon. Status post appendectomy. Vascular/Lymphatic: Moderate aortic atherosclerosis without aneurysm. No suspicious adenopathy Reproductive: Status post prostatectomy. Other: Negative for free air or free fluid. Musculoskeletal: Scoliosis and degenerative changes of the spine. Previous right hip replacement. [place] changes about the right femoral component at the trochanteric region. IMPRESSION: 1. No CT evidence for acute intra-abdominal or pelvic abnormality. 2. Bilateral renal cysts 3. Prior right hip replacement. Lucent changes about the right femoral component at the trochanter suggesting potential particle disease. Electronically Signed   By: Donavan Foil M.D.   On: 01/03/2020 01:37   US Abdomen Limited RUQ  Result Date:  01/03/2020 CLINICAL DATA:  Pancreatitis.  Elevated LFTs. EXAM: ULTRASOUND ABDOMEN LIMITED RIGHT UPPER QUADRANT COMPARISON:  None. FINDINGS: Gallbladder: Borderline gallbladder wall thickening, measuring 4 mm. Gallbladder is filled with sludge. No sonographic Murphy's sign elicited during the exam, per the sonographer. Common bile duct: Diameter: 5 mm Liver: No focal lesion identified. Within normal limits in parenchymal echogenicity. Portal vein is patent on color Doppler imaging with normal direction of blood flow towards the liver. Other: None. IMPRESSION: 1. Gallbladder is filled with sludge. Borderline gallbladder wall thickening, suspicious for early/mild acute cholecystitis. 2. No bile duct dilatation. 3. Liver appears normal. Electronically Signed   By: Franki Cabot M.D.   On: 01/03/2020 09:03     Time Spent in minutes  30     Desiree Hane M.D on 01/06/2020 at 11:17 PM  To page go to [email or web address] - password [facility]

## 2020-01-06 NOTE — Discharge Instructions (Signed)
  Diet: Resume home heart healthy regular diet.   Activity: May increase activity as tolerated. Light activity and walking are encouraged. Do not drive or drink alcohol if taking narcotic pain medications.  Wound care: May shower with soapy water and pat dry (do not rub incisions), but no baths or submerging incision underwater until follow-up. (no swimming)   Medications: Resume all home medications. For mild to moderate pain: acetaminophen (Tylenol) or ibuprofen (if no kidney disease). Combining Tylenol with alcohol can substantially increase your risk of causing liver disease. Narcotic pain medications, if prescribed, can be used for severe pain, though may cause nausea, constipation, and drowsiness. Do not combine Tylenol and Norco within a 6 hour period as Norco contains Tylenol. If you do not need the narcotic pain medication, you do not need to fill the prescription.  Call office [phone]) at any time if any questions, worsening pain, fevers/chills, bleeding, drainage from incision site, or other concerns.

## 2020-01-06 NOTE — Progress Notes (Signed)
01/06/2020 12:21 PM  Delora Fuel to be D/C'd Home per MD order.  Discussed prescriptions and follow up appointments with the patient. Prescriptions given to patient, medication list explained in detail. Pt verbalized understanding.  Allergies as of 01/06/2020   No Known Allergies     Medication List    TAKE these medications   acetaminophen 325 MG tablet Commonly known as: TYLENOL Take 2 tablets (650 mg total) by mouth every 6 (six) hours as needed for mild pain (or temp > 37.5 C (99.5 F)). Notes to patient: As needed   aspirin 81 MG tablet Take 1 tablet (81 mg total) by mouth daily. Take aspirin 81 mg once daily by mouth for 3 weeks(21 days) and then stop Notes to patient: Morning 01/07/20   cyanocobalamin 2000 MCG tablet Take 2,000 mcg by mouth daily. Notes to patient: Morning 01/07/20   HYDROcodone-acetaminophen 5-325 MG tablet Commonly known as: Norco Take 1 tablet by mouth every 4 (four) hours as needed for up to 3 days for moderate pain. Notes to patient: As needed   omeprazole 40 MG capsule Commonly known as: PRILOSEC TAKE 1 CAPSULE EVERY DAY Notes to patient: Morning 01/07/20   RED YEAST RICE PO Take 900 mg by mouth every morning. Notes to patient: Morning 01/07/20   vitamin E 180 MG (400 UNITS) capsule Generic drug: vitamin E Take 400 Units by mouth daily. Notes to patient: Morning 01/07/20       Vitals:   01/06/20 0359 01/06/20 0859  BP: (!) 135/92 125/71  Pulse: 64 88  Resp: 19 16  Temp: 97.7 F (36.5 C) 97.9 F (36.6 C)  SpO2: 97% 98%    Skin clean, dry and intact without evidence of skin break down, no evidence of skin tears noted. IV catheter discontinued intact. Site without signs and symptoms of complications. Dressing and pressure applied. Pt denies pain at this time. No complaints noted.  An After Visit Summary was printed and given to the patient. Patient escorted via [place], and D/C home via private auto.  Dola Argyle

## 2020-01-06 NOTE — Discharge Summary (Signed)
  Patient ID: ODIN CHADWICK MRN: [id number] DOB/AGE: 01-21-1941 79 y.o.  Admit date: 01/02/2020 Discharge date: 01/06/2020   Discharge Diagnoses:  Active Problems:   Acute pancreatitis   History of prostate cancer   History of CVA (cerebrovascular accident) without residual deficits   Gallstone pancreatitis   Procedures: Robotic assisted laparoscopic cholecystectomy  Hospital Course: Patient with gallstone pancreatitis.  He was treated for his pancreatitis.  After pancreatitis resolved he was taken to the operating room for cholecystectomy.  He tolerated the procedure well.  Today the patient with pain control with current pain medications.  He is tolerating diet.  He is ambulating.  Wounds are dry and clean.  Physical Exam  Constitutional: He is oriented to person, place, and time and well-developed, well-nourished, and in no distress.  Cardiovascular: Normal rate.  Pulmonary/Chest: Effort normal.  Abdominal: Soft. He exhibits no distension. There is no abdominal tenderness. There is no rebound.  Neurological: He is alert and oriented to person, place, and time.  Wounds are dry and clean  Consults: General surgery  Disposition: Discharge disposition: 01-Home or Self Care       Discharge Instructions    Diet - low sodium heart healthy   Complete by: As directed    Diet - low sodium heart healthy   Complete by: As directed    Increase activity slowly   Complete by: As directed    Increase activity slowly   Complete by: As directed      Allergies as of 01/06/2020   No Known Allergies     Medication List    TAKE these medications   acetaminophen 325 MG tablet Commonly known as: TYLENOL Take 2 tablets (650 mg total) by mouth every 6 (six) hours as needed for mild pain (or temp > 37.5 C (99.5 F)).   aspirin 81 MG tablet Take 1 tablet (81 mg total) by mouth daily. Take aspirin 81 mg once daily by mouth for 3 weeks(21 days) and then stop   cyanocobalamin 2000 MCG  tablet Take 2,000 mcg by mouth daily.   HYDROcodone-acetaminophen 5-325 MG tablet Commonly known as: Norco Take 1 tablet by mouth every 4 (four) hours as needed for up to 3 days for moderate pain.   omeprazole 40 MG capsule Commonly known as: PRILOSEC TAKE 1 CAPSULE EVERY DAY   RED YEAST RICE PO Take 900 mg by mouth every morning.   vitamin E 180 MG (400 UNITS) capsule Generic drug: vitamin E Take 400 Units by mouth daily.      Follow-up Information    Herbert Pun, MD Follow up in 2 week(s).   Specialty: General Surgery Contact information: [address] [place] [address] [phone]

## 2020-01-06 NOTE — Telephone Encounter (Signed)
Transition Care Management Follow-up Telephone Call  Date of discharge and from where: 01/06/2020, [facility]  How have you been since you were released from the hospital? Patient states that he is feeling much better.   Any questions or concerns? No   Items Reviewed:  Did the pt receive and understand the discharge instructions provided? Yes   Medications obtained and verified? Yes   Any new allergies since your discharge? No   Dietary orders reviewed? Yes  Do you have support at home? Yes   Functional Questionnaire: (I = Independent and D = Dependent) ADLs: I  Bathing/Dressing- I  Meal Prep- I  Eating- I  Maintaining continence- I  Transferring/Ambulation- I  Managing Meds- i  Follow up appointments reviewed:   PCP Hospital f/u appt confirmed? No  Patient states that he is following up with his surgeon in the next 2 weeks and after that he plans to call the office and schedule an appointment with Dr. Lorelei Pont.   [facility] f/u appt confirmed? Yes  Scheduled to see general surgeon  Are transportation arrangements needed? No   If their condition worsens, is the pt aware to call PCP or go to the Emergency Dept.? Yes  Was the patient provided with contact information for the PCP's office or ED? Yes  Was to pt encouraged to call back with questions or concerns? Yes

## 2020-05-03 DIAGNOSIS — D485 Neoplasm of uncertain behavior of skin: Secondary | ICD-10-CM | POA: Diagnosis not present

## 2020-05-03 DIAGNOSIS — C4441 Basal cell carcinoma of skin of scalp and neck: Secondary | ICD-10-CM | POA: Diagnosis not present

## 2020-05-03 DIAGNOSIS — Z85828 Personal history of other malignant neoplasm of skin: Secondary | ICD-10-CM | POA: Diagnosis not present

## 2020-05-31 ENCOUNTER — Encounter: Payer: Self-pay | Admitting: *Deleted

## 2020-05-31 ENCOUNTER — Ambulatory Visit
Admission: EM | Admit: 2020-05-31 | Discharge: 2020-05-31 | Disposition: A | Discharge status: Home / Self Care | Payer: Medicare Other | Attending: Family Medicine | Admitting: Family Medicine

## 2020-05-31 DIAGNOSIS — R519 Headache, unspecified: Secondary | ICD-10-CM

## 2020-05-31 DIAGNOSIS — H6123 Impacted cerumen, bilateral: Secondary | ICD-10-CM

## 2020-05-31 DIAGNOSIS — H938X3 Other specified disorders of ear, bilateral: Secondary | ICD-10-CM

## 2020-05-31 NOTE — Discharge Instructions (Addendum)
We cleaned out your ears in the office today.  You may use a solution of half water and half peroxide to clean your ears.  You may dip cotton ball in the solution in do a few drops into each ear 2-3 times per week to help the wax from accumulating in your canal.  Follow-up with this office or primary care as needed

## 2020-05-31 NOTE — ED Triage Notes (Signed)
Patient reports intermittent headaches, states that he woke up around 3am with headache and took some tylenol. States headaches have been present for 3-4 weeks. States they happen a couple times a week.   Reports ear stuffiness bilateral x 6-7 weeks.

## 2020-05-31 NOTE — ED Provider Notes (Signed)
[place]   [id number] 05/31/20 Arrival Time: 1301  CC: EAR PAIN  SUBJECTIVE: History from: patient.  Scott [name] is a 79 y.o. male who presents with of bilateral ear fullness and headaches on and off for the past couple of weeks. Has been trying to use Q tips to clean the ears. Denies a precipitating event, such as swimming or wearing ear plugs. Patient states the pain is constant and achy in character. Symptoms are made worse with lying down. Denies similar symptoms in the past. Denies fever, chills, fatigue, sinus pain, rhinorrhea, ear discharge, sore throat, SOB, wheezing, chest pain, nausea, changes in bowel or bladder habits.    ROS: As per HPI.  All other pertinent ROS negative.     Past Medical History:  Diagnosis Date  . Allergic rhinitis due to pollen   . Arthritis   . 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 ([facility]) 06/23/2019  . GERD (gastroesophageal reflux disease)   . Hyperlipidemia   . Prostate cancer ([place])    cancer  . Right bundle branch block    RBBB   Past Surgical History:  Procedure Laterality Date  . APPENDECTOMY    . HERNIA REPAIR  85,03   rt and lt ing hernia  . KNEE ARTHROSCOPY  2012   left  . LEFT HEART CATH AND CORONARY ANGIOGRAPHY N/A 05/30/2018   Procedure: LEFT HEART CATH AND CORONARY ANGIOGRAPHY;  Surgeon: Wellington Hampshire, MD;  Location: [facility];  Service: Cardiovascular;  Laterality: N/A;  . prostatectomy  1998  . ROTATOR CUFF REPAIR     right  . SHOULDER ARTHROSCOPY Right 01/29/2013   Procedure: RIGHT ARTHROSCOPY SHOULDER WITH DEBRIDEMENT, POSSIBLE ROTATOR CUFF REPAIR;  Surgeon: Kerin Salen, MD;  Location: [facility];  Service: Orthopedics;  Laterality: Right;  . TONSILLECTOMY    . TOTAL HIP ARTHROPLASTY  2007   right    No Known Allergies No current facility-administered medications on file prior to encounter.   Current Outpatient Medications on File Prior to Encounter    Medication Sig Dispense Refill  . acetaminophen (TYLENOL) 325 MG tablet Take 2 tablets (650 mg total) by mouth every 6 (six) hours as needed for mild pain (or temp > 37.5 C (99.5 F)).    Marland Kitchen aspirin 81 MG tablet Take 1 tablet (81 mg total) by mouth daily. Take aspirin 81 mg once daily by mouth for 3 weeks(21 days) and then stop 21 tablet 0  . cyanocobalamin 2000 MCG tablet Take 2,000 mcg by mouth daily.    Marland Kitchen omeprazole (PRILOSEC) 40 MG capsule TAKE 1 CAPSULE EVERY DAY 90 capsule 3  . Red Yeast Rice Extract (RED YEAST RICE PO) Take 900 mg by mouth every morning.    . vitamin E (VITAMIN E) 400 UNIT capsule Take 400 Units by mouth daily.     Social History   Socioeconomic History  . Marital status: Married    Spouse name: Not on file  . Number of children: Not on file  . Years of education: Not on file  . Highest education level: Not on file  Occupational History  . Occupation: Herbalist: RETIRED  Tobacco Use  . Smoking status: Never Smoker  . Smokeless tobacco: Never Used  Substance and Sexual Activity  . Alcohol use: No  . Drug use: No  . Sexual activity: Not on file  Other Topics Concern  . Not on file  Social History Narrative  Former competitive bodybuilder, Geophysicist/field seismologist, all natural   Conservation officer, nature, [employer], [employer], multiple years   Social Determinants of Radio broadcast assistant Strain:   . Difficulty of Paying Living Expenses: Not on file  Food Insecurity:   . Worried About Charity fundraiser in the Last Year: Not on file  . Ran Out of Food in the Last Year: Not on file  Transportation Needs:   . Lack of Transportation (Medical): Not on file  . Lack of Transportation (Non-Medical): Not on file  Physical Activity:   . Days of Exercise per Week: Not on file  . Minutes of Exercise per Session: Not on file  Stress:   . Feeling of Stress : Not on file  Social Connections:   . Frequency of Communication with Friends and Family: Not on file   . Frequency of Social Gatherings with Friends and Family: Not on file  . Attends Religious Services: Not on file  . Active Member of Clubs or Organizations: Not on file  . Attends Archivist Meetings: Not on file  . Marital Status: Not on file  Intimate Partner Violence:   . Fear of Current or Ex-Partner: Not on file  . Emotionally Abused: Not on file  . Physically Abused: Not on file  . Sexually Abused: Not on file   Family History  Problem Relation Age of Onset  . Cancer Father        prostate  . Cancer Brother        prostate  . Cancer Brother        prostate    OBJECTIVE:  Vitals:   05/31/20 1348  BP: 135/85  Pulse: 65  Resp: 15  Temp: 98 F (36.7 C)  TempSrc: Oral  SpO2: 95%     General appearance: alert; appears fatigued HEENT: Ears: Cerumen impaction to bilateral EACs, TMs pearly gray with visible cone of light, without erythema ; Eyes: PERRL, EOMI grossly; Sinuses nontender to palpation; Nose: clear rhinorrhea; Throat: oropharynx mildly erythematous, tonsils 1+ without white tonsillar exudates, uvula midline Neck: supple without LAD Lungs: unlabored respirations, symmetrical air entry; cough: absent; no respiratory distress Heart: regular rate and rhythm.  Radial pulses 2+ symmetrical bilaterally Skin: warm and dry Psychological: alert and cooperative; normal mood and affect  Imaging: No results found.   ASSESSMENT & PLAN:  1. Bilateral impacted cerumen   2. Sensation of fullness in both ears   3. Nonintractable headache, unspecified chronicity pattern, unspecified headache type     No orders of the defined types were placed in this encounter.  Ears irrigated in office today May clean at home with half peroxide half water solution 2-3 times a week Rest and drink plenty of fluids Take medications as directed and to completion Continue to use OTC ibuprofen and/ or tylenol as needed for pain control Follow up with PCP if symptoms  persists Return here or go to the ER if you have any new or worsening symptoms   Reviewed expectations re: course of current medical issues. Questions answered. Outlined signs and symptoms indicating need for more acute intervention. Patient verbalized understanding. After Visit Summary given.         Faustino Congress, NP 05/31/20 1502

## 2020-06-01 DIAGNOSIS — M720 Palmar fascial fibromatosis [Dupuytren]: Secondary | ICD-10-CM | POA: Diagnosis not present

## 2020-06-01 DIAGNOSIS — M65331 Trigger finger, right middle finger: Secondary | ICD-10-CM | POA: Diagnosis not present

## 2020-06-01 DIAGNOSIS — M19131 Post-traumatic osteoarthritis, right wrist: Secondary | ICD-10-CM | POA: Diagnosis not present

## 2020-06-02 DIAGNOSIS — Z23 Encounter for immunization: Secondary | ICD-10-CM | POA: Diagnosis not present

## 2020-06-07 ENCOUNTER — Other Ambulatory Visit: Payer: Self-pay

## 2020-06-07 ENCOUNTER — Ambulatory Visit (INDEPENDENT_AMBULATORY_CARE_PROVIDER_SITE_OTHER): Payer: Medicare Other | Admitting: Family Medicine

## 2020-06-07 ENCOUNTER — Encounter: Payer: Self-pay | Admitting: Family Medicine

## 2020-06-07 VITALS — BP 106/76 | HR 74 | Temp 98.1°F | Ht 68.25 in | Wt 168.0 lb

## 2020-06-07 DIAGNOSIS — R519 Headache, unspecified: Secondary | ICD-10-CM

## 2020-06-07 DIAGNOSIS — G4700 Insomnia, unspecified: Secondary | ICD-10-CM | POA: Diagnosis not present

## 2020-06-07 NOTE — Patient Instructions (Signed)
Insomnia:  Melatonin up to 10 mg can be taken 1 hour before sleep very safely every day  Antihistamines: 2 tabs of Benadryl is OK Or can also take 2 Dramamine (get the older version that can cause drowsiness) Or Unisom (doxylamine)

## 2020-06-07 NOTE — Progress Notes (Signed)
Lindsay Soulliere T. Strummer Canipe, MD, [place] at [facility] [place], [address]  Phone: [phone]  FAX: [phone]  MAKYI LEDO - 79 y.o. male  MRN [id number]  Date of Birth: 1941/04/23  Date: 06/07/2020  PCP: Owens Loffler, MD  Referral: Owens Loffler, MD  Chief Complaint  Patient presents with  . Headache    Off and on for about 4 to 5 weeks. He has not checked his blood pressure. No visual changes.   . Nausea    Nausea since his gallbladder removal.    This visit occurred during the SARS-CoV-2 public health emergency.  Safety protocols were in place, including screening questions prior to the visit, additional usage of staff PPE, and extensive cleaning of exam room while observing appropriate contact time as indicated for disinfecting solutions.   Subjective:   FALLOU HULBERT is a 79 y.o. very pleasant male patient with Body mass index is 25.36 kg/m. who presents with the following:  He presents with headaches x 3 weeks today and stomach issues:  S/p Cholecystecomy for GB pancreatitis.  He has been having some insomnia and not sleeping before to 5 hours a night.  He did take one of his friends medication that did seem to help some  Has borrowed some temazepam.  Headaches -  And with some lack of sleep, this does seem to make it worse when he sleeps less.  Stomach will bother him.  This is predominantly been worse over the last few weeks when he has been taking some Excedrin.  Rare excedrin - nothing in a week    Review of Systems is noted in the HPI, as appropriate  Objective:   BP 106/76 (BP Location: Left Arm, Patient Position: Sitting, Cuff Size: Normal)   Pulse 74   Temp 98.1 F (36.7 C)   Ht 5' 8.25" (1.734 m)   Wt 168 lb (76.2 kg)   SpO2 98%   BMI 25.36 kg/m   GEN: No acute distress; alert,appropriate. PULM: Breathing comfortably in no  respiratory distress   ABD: S, NT, ND, + BS, No rebound, No HSM   Neuro: CN 2-12 grossly intact. PERRLA. EOMI. Sensation intact throughout. Str 5/5 all extremities. DTR 2+. No clonus. A and o x 4. Romberg neg. Finger nose neg. Heel -shin neg.   PSYCH: Normally interactive. Conversant. Not depressed or anxious appearing.  Calm demeanor.     Laboratory and Imaging Data:  Assessment and Plan:     ICD-10-CM   1. Insomnia, unspecified type  G47.00   2. Acute intractable headache, unspecified headache type  R51.9    I strongly suspect that his headaches are influenced by his ongoing significant lack of sleep.  He can get this regulated, then I hope that hers symptoms will resolve.  Patient Instructions  Insomnia:  Melatonin up to 10 mg can be taken 1 hour before sleep very safely every day  Antihistamines: 2 tabs of Benadryl is OK Or can also take 2 Dramamine (get the older version that can cause drowsiness) Or Unisom (doxylamine)     No orders of the defined types were placed in this encounter.  There are no discontinued medications. No orders of the defined types were placed in this encounter.   Follow-up: No follow-ups on file.  Signed,  Maud Deed. Kattleya Kuhnert, MD   Outpatient Encounter Medications as of 06/07/2020  Medication Sig  . acetaminophen (TYLENOL)  325 MG tablet Take 2 tablets (650 mg total) by mouth every 6 (six) hours as needed for mild pain (or temp > 37.5 C (99.5 F)).  Marland Kitchen aspirin 81 MG tablet Take 1 tablet (81 mg total) by mouth daily. Take aspirin 81 mg once daily by mouth for 3 weeks(21 days) and then stop  . cyanocobalamin 2000 MCG tablet Take 2,000 mcg by mouth daily.  Marland Kitchen omeprazole (PRILOSEC) 40 MG capsule TAKE 1 CAPSULE EVERY DAY  . Red Yeast Rice Extract (RED YEAST RICE PO) Take 900 mg by mouth every morning.  . vitamin E (VITAMIN E) 400 UNIT capsule Take 400 Units by mouth daily.   No facility-administered encounter medications on file as of 06/07/2020.

## 2020-06-30 ENCOUNTER — Encounter: Payer: Self-pay | Admitting: Family Medicine

## 2020-06-30 ENCOUNTER — Ambulatory Visit (INDEPENDENT_AMBULATORY_CARE_PROVIDER_SITE_OTHER): Payer: Medicare Other | Admitting: Family Medicine

## 2020-06-30 ENCOUNTER — Other Ambulatory Visit: Payer: Self-pay

## 2020-06-30 VITALS — BP 120/70 | HR 70 | Temp 98.5°F | Ht 68.25 in | Wt 168.2 lb

## 2020-06-30 DIAGNOSIS — Z1211 Encounter for screening for malignant neoplasm of colon: Secondary | ICD-10-CM | POA: Diagnosis not present

## 2020-06-30 DIAGNOSIS — K921 Melena: Secondary | ICD-10-CM

## 2020-06-30 DIAGNOSIS — R1010 Upper abdominal pain, unspecified: Secondary | ICD-10-CM

## 2020-06-30 LAB — BASIC METABOLIC PANEL
BUN: 23 mg/dL (ref 6–23)
CO2: 30 mEq/L (ref 19–32)
Calcium: 9.3 mg/dL (ref 8.4–10.5)
Chloride: 104 mEq/L (ref 96–112)
Creatinine, Ser: 0.97 mg/dL (ref 0.40–1.50)
GFR: 75.41 mL/min (ref >60.00)
Glucose, Bld: 93 mg/dL (ref 70–99)
Potassium: 4.7 mEq/L (ref 3.5–5.1)
Sodium: 141 mEq/L (ref 135–145)

## 2020-06-30 LAB — CBC WITH DIFFERENTIAL/PLATELET
Basophils Absolute: 0 10*3/uL (ref 0.0–0.1)
Basophils Relative: 0.5 % (ref 0.0–3.0)
Eosinophils Absolute: 0.1 10*3/uL (ref 0.0–0.7)
Eosinophils Relative: 2.9 % (ref 0.0–5.0)
HCT: 46.6 % (ref 39.0–52.0)
Hemoglobin: 15.3 g/dL (ref 13.0–17.0)
Lymphocytes Relative: 16.7 % (ref 12.0–46.0)
Lymphs Abs: 0.8 10*3/uL (ref 0.7–4.0)
MCHC: 32.8 g/dL (ref 30.0–36.0)
MCV: 90.9 fl (ref 78.0–100.0)
Monocytes Absolute: 0.5 10*3/uL (ref 0.1–1.0)
Monocytes Relative: 10.4 % (ref 3.0–12.0)
Neutro Abs: 3.4 10*3/uL (ref 1.4–7.7)
Neutrophils Relative %: 69.5 % (ref 43.0–77.0)
Platelets: 194 10*3/uL (ref 150.0–400.0)
RBC: 5.12 Mil/uL (ref 4.22–5.81)
RDW: 14.1 % (ref 11.5–15.5)
WBC: 4.9 10*3/uL (ref 4.0–10.5)

## 2020-06-30 LAB — HEPATIC FUNCTION PANEL
ALT: 23 U/L (ref 0–53)
AST: 23 U/L (ref 0–37)
Albumin: 4.2 g/dL (ref 3.5–5.2)
Alkaline Phosphatase: 67 U/L (ref 39–117)
Bilirubin, Direct: 0.2 mg/dL (ref 0.0–0.3)
Total Bilirubin: 0.8 mg/dL (ref 0.2–1.2)
Total Protein: 6.5 g/dL (ref 6.0–8.3)

## 2020-06-30 LAB — LIPASE: Lipase: 24 U/L (ref 11.0–59.0)

## 2020-06-30 LAB — H. PYLORI ANTIBODY, IGG: H Pylori IgG: NEGATIVE

## 2020-06-30 MED ORDER — PANTOPRAZOLE SODIUM 40 MG PO TBEC: 40.0000 mg | DELAYED_RELEASE_TABLET | Freq: Every day | ORAL | 3 refills | Status: DC

## 2020-06-30 NOTE — Progress Notes (Signed)
Scott [name] T. Scott Moylan, MD, [place] at [facility] [name], [address]  Phone: [phone]  FAX: [phone]  Scott [name] - 79 y.o. male  MRN [id number]  Date of Birth: 02-14-1941  Date: 06/30/2020  PCP: Owens Loffler, MD  Referral: Owens Loffler, MD  Chief Complaint  Patient presents with  . Heartburn    This visit occurred during the SARS-CoV-2 public health emergency.  Safety protocols were in place, including screening questions prior to the visit, additional usage of staff PPE, and extensive cleaning of exam room while observing appropriate contact time as indicated for disinfecting solutions.   Subjective:   Scott [name] is a 79 y.o. very pleasant male patient with Body mass index is 25.4 kg/m. who presents with the following:  S/p L heart cath 05/2018. Normal coronaries. Severe pancreatitis 12/2019, Lipase 1,000. S/p lap chole 01/05/2020.  He has some known reflux, and he has been taking Prilosec, but his reflux is flared up over the last couple of weeks.  He also noticed that he has developed some melena.  He does not have any other kind of inciting event, any does not have any recent ingestion of iron or Pepto-Bismol.  He does have some epigastric pain.  He does not have any bright red blood per rectum.  He does not have any history of inflammatory bowel disease.  His last colonoscopy was in 2015, and he is due for his repeat colonoscopy.  Worse in the past couple of weeks.    IFOB kit.    Review of Systems is noted in the HPI, as appropriate  Objective:   BP 120/70   Pulse 70   Temp 98.5 F (36.9 C) (Temporal)   Ht 5' 8.25" (1.734 m)   Wt 168 lb 4 oz (76.3 kg)   SpO2 96%   BMI 25.40 kg/m   GEN: No acute distress; alert,appropriate. PULM: Breathing comfortably in no respiratory distress PSYCH: Normally interactive.  CV: RRR, no m/g/r  ABD: S,  NT, ND, + BS, No rebound, No HSM   Laboratory and Imaging Data: Results for orders placed or performed in visit on 06/30/20  Hepatic function panel  Result Value Ref Range   Total Bilirubin 0.8 0.2 - 1.2 mg/dL   Bilirubin, Direct 0.2 0.0 - 0.3 mg/dL   Alkaline Phosphatase 67 39 - 117 U/L   AST 23 0 - 37 U/L   ALT 23 0 - 53 U/L   Total Protein 6.5 6.0 - 8.3 g/dL   Albumin 4.2 3.5 - 5.2 g/dL  Lipase  Result Value Ref Range   Lipase 24.0 11 - 59 U/L  Basic metabolic panel  Result Value Ref Range   Sodium 141 135 - 145 mEq/L   Potassium 4.7 3.5 - 5.1 mEq/L   Chloride 104 96 - 112 mEq/L   CO2 30 19 - 32 mEq/L   Glucose, Bld 93 70 - 99 mg/dL   BUN 23 6 - 23 mg/dL   Creatinine, Ser 0.97 0.40 - 1.50 mg/dL   GFR 75.41 >60.00 mL/min   Calcium 9.3 8.4 - 10.5 mg/dL  CBC with Differential/Platelet  Result Value Ref Range   WBC 4.9 4.0 - 10.5 K/uL   RBC 5.12 4.22 - 5.81 Mil/uL   Hemoglobin 15.3 13.0 - 17.0 g/dL   HCT 46.6 39 - 52 %   MCV 90.9 78.0 - 100.0 fl   MCHC  32.8 30.0 - 36.0 g/dL   RDW 14.1 11.5 - 15.5 %   Platelets 194.0 150 - 400 K/uL   Neutrophils Relative % 69.5 43 - 77 %   Lymphocytes Relative 16.7 12 - 46 %   Monocytes Relative 10.4 3 - 12 %   Eosinophils Relative 2.9 0 - 5 %   Basophils Relative 0.5 0 - 3 %   Neutro Abs 3.4 1.4 - 7.7 K/uL   Lymphs Abs 0.8 0.7 - 4.0 K/uL   Monocytes Absolute 0.5 0.1 - 1.0 K/uL   Eosinophils Absolute 0.1 0.0 - 0.7 K/uL   Basophils Absolute 0.0 0.0 - 0.1 K/uL  H. pylori antibody, IgG  Result Value Ref Range   H Pylori IgG Negative Negative     Assessment and Plan:     ICD-10-CM   1. Pain of upper abdomen  R10.10 Hepatic function panel    Lipase    Basic metabolic panel    CBC with Differential/Platelet    H. pylori antibody, IgG    Fecal occult blood, imunochemical    CANCELED: Fecal occult blood, imunochemical  2. Melena  K92.1 Hepatic function panel    Lipase    Basic metabolic panel    CBC with Differential/Platelet     H. pylori antibody, IgG    Fecal occult blood, imunochemical    CANCELED: Fecal occult blood, imunochemical  3. Colon cancer screening  Z12.11    Patient does have epigastric pain in the setting of melena.  Worried that could have a ulcer.  Labs have been obtained, and they are grossly unremarkable.  Changed to Protonix, discontinue Prilosec.  On record review, the patient is due for his repeat colonoscopy.  I will communicate this with the patient, and we will figure out his preferences for GI consultation.  In addition to this, I think a formal face-to-face appointment would be very helpful getting his ongoing symptoms of melena.  Meds ordered this encounter  Medications  . pantoprazole (PROTONIX) 40 MG tablet    Sig: Take 1 tablet (40 mg total) by mouth daily.    Dispense:  30 tablet    Refill:  3   Medications Discontinued During This Encounter  Medication Reason  . omeprazole (PRILOSEC) 40 MG capsule    Orders Placed This Encounter  Procedures  . Fecal occult blood, imunochemical  . Hepatic function panel  . Lipase  . Basic metabolic panel  . CBC with Differential/Platelet  . H. pylori antibody, IgG    Follow-up: No follow-ups on file.  Signed,  Maud Deed. Codie Hainer, MD   Outpatient Encounter Medications as of 06/30/2020  Medication Sig  . acetaminophen (TYLENOL) 325 MG tablet Take 2 tablets (650 mg total) by mouth every 6 (six) hours as needed for mild pain (or temp > 37.5 C (99.5 F)).  Marland Kitchen aspirin 81 MG tablet Take 1 tablet (81 mg total) by mouth daily. Take aspirin 81 mg once daily by mouth for 3 weeks(21 days) and then stop  . cyanocobalamin 2000 MCG tablet Take 2,000 mcg by mouth daily.  . Red Yeast Rice Extract (RED YEAST RICE PO) Take 900 mg by mouth every morning.  . vitamin E (VITAMIN E) 400 UNIT capsule Take 400 Units by mouth daily.  . [DISCONTINUED] omeprazole (PRILOSEC) 40 MG capsule TAKE 1 CAPSULE EVERY DAY  . meloxicam (MOBIC) 15 MG tablet Take 15 mg by  mouth daily as needed.  . pantoprazole (PROTONIX) 40 MG tablet Take 1 tablet (40 mg  total) by mouth daily.   No facility-administered encounter medications on file as of 06/30/2020.

## 2020-07-01 ENCOUNTER — Telehealth: Payer: Self-pay

## 2020-07-01 ENCOUNTER — Telehealth: Payer: Self-pay | Admitting: Family Medicine

## 2020-07-01 DIAGNOSIS — Z1211 Encounter for screening for malignant neoplasm of colon: Secondary | ICD-10-CM

## 2020-07-01 DIAGNOSIS — K921 Melena: Secondary | ICD-10-CM

## 2020-07-01 DIAGNOSIS — R1010 Upper abdominal pain, unspecified: Secondary | ICD-10-CM

## 2020-07-01 NOTE — Telephone Encounter (Signed)
Patient aware of results.  He would like to follow up with Dr Marcello Moores [place], unsure of location.

## 2020-07-01 NOTE — Telephone Encounter (Signed)
-----   Message from Owens Loffler, MD sent at 07/01/2020 10:18 AM EST ----- Can you call  All of his labs look fine, including pancreas and liver numbers  I really think he needs to see gastroenterology regardless.  He is due for his colonoscopy - I missed that while in the office.  With the black stool, I think he should see them for all of this.  Would he rather see someone in [place] or [place]?

## 2020-07-01 NOTE — Telephone Encounter (Signed)
Pt called in and stated that when he called the GI doctor they him that he needed to do a consultation and they needed to do a blood and stool collection and he stated that he already did it and he is not about to waste their time.

## 2020-07-02 ENCOUNTER — Other Ambulatory Visit (INDEPENDENT_AMBULATORY_CARE_PROVIDER_SITE_OTHER): Payer: Medicare Other

## 2020-07-02 DIAGNOSIS — R1010 Upper abdominal pain, unspecified: Secondary | ICD-10-CM | POA: Diagnosis not present

## 2020-07-02 DIAGNOSIS — K921 Melena: Secondary | ICD-10-CM

## 2020-07-02 LAB — FECAL OCCULT BLOOD, IMMUNOCHEMICAL: Fecal Occult Bld: NEGATIVE

## 2020-07-02 NOTE — Telephone Encounter (Signed)
Pt called and says he would prefer to Dr. Therisa Doyne (sp) [phone] instead of the GI he was referred to. Could you please place referral?

## 2020-07-12 DIAGNOSIS — Z1152 Encounter for screening for COVID-19: Secondary | ICD-10-CM | POA: Diagnosis not present

## 2020-07-12 DIAGNOSIS — Z03818 Encounter for observation for suspected exposure to other biological agents ruled out: Secondary | ICD-10-CM | POA: Diagnosis not present

## 2020-07-23 DIAGNOSIS — Z03818 Encounter for observation for suspected exposure to other biological agents ruled out: Secondary | ICD-10-CM | POA: Diagnosis not present

## 2020-07-23 DIAGNOSIS — Z1152 Encounter for screening for COVID-19: Secondary | ICD-10-CM | POA: Diagnosis not present

## 2020-07-29 DIAGNOSIS — K921 Melena: Secondary | ICD-10-CM | POA: Diagnosis not present

## 2020-07-29 DIAGNOSIS — Z1211 Encounter for screening for malignant neoplasm of colon: Secondary | ICD-10-CM | POA: Diagnosis not present

## 2020-07-29 DIAGNOSIS — R1013 Epigastric pain: Secondary | ICD-10-CM | POA: Diagnosis not present

## 2020-07-30 DIAGNOSIS — K921 Melena: Secondary | ICD-10-CM | POA: Diagnosis not present

## 2020-08-24 DIAGNOSIS — Z01812 Encounter for preprocedural laboratory examination: Secondary | ICD-10-CM | POA: Diagnosis not present

## 2020-08-27 DIAGNOSIS — R1013 Epigastric pain: Secondary | ICD-10-CM | POA: Diagnosis not present

## 2020-08-27 DIAGNOSIS — R1010 Upper abdominal pain, unspecified: Secondary | ICD-10-CM | POA: Diagnosis not present

## 2020-08-27 DIAGNOSIS — K2930 Chronic superficial gastritis without bleeding: Secondary | ICD-10-CM | POA: Diagnosis not present

## 2020-08-27 DIAGNOSIS — R12 Heartburn: Secondary | ICD-10-CM | POA: Diagnosis not present

## 2020-08-27 DIAGNOSIS — Q399 Congenital malformation of esophagus, unspecified: Secondary | ICD-10-CM | POA: Diagnosis not present

## 2020-08-27 DIAGNOSIS — K449 Diaphragmatic hernia without obstruction or gangrene: Secondary | ICD-10-CM | POA: Diagnosis not present

## 2020-08-27 DIAGNOSIS — K319 Disease of stomach and duodenum, unspecified: Secondary | ICD-10-CM | POA: Diagnosis not present

## 2020-08-27 DIAGNOSIS — K317 Polyp of stomach and duodenum: Secondary | ICD-10-CM | POA: Diagnosis not present

## 2020-08-27 DIAGNOSIS — K921 Melena: Secondary | ICD-10-CM | POA: Diagnosis not present

## 2020-08-27 DIAGNOSIS — K3189 Other diseases of stomach and duodenum: Secondary | ICD-10-CM | POA: Diagnosis not present

## 2020-09-01 DIAGNOSIS — K317 Polyp of stomach and duodenum: Secondary | ICD-10-CM | POA: Diagnosis not present

## 2020-09-01 DIAGNOSIS — K2930 Chronic superficial gastritis without bleeding: Secondary | ICD-10-CM | POA: Diagnosis not present

## 2020-09-01 DIAGNOSIS — K319 Disease of stomach and duodenum, unspecified: Secondary | ICD-10-CM | POA: Diagnosis not present

## 2020-09-07 ENCOUNTER — Telehealth: Payer: Self-pay

## 2020-09-07 NOTE — Telephone Encounter (Signed)
Contacted pt who reports sore throat is about gone and the only symptoms that persist are HA and runny nose for 6 days. He is taking excedrin tension for HA. He reports he did not take a home covid test because his wife also had symptoms and they only had one test. She tested and it was positive. He is trying to get another test but is not able to find another home test.  He is going to continue to try to find tests and test himself. Pt denies any other symptoms and denies any SOB. Advised pt if he developed any new symptoms or any worsened to contact the office. Advised of ER precautions.  Pt said he was most worried about his 59 month old grandson who he and his wife were caring for last week and now has covid. He was pretty sick for a while but he reports he is recovering now.

## 2020-09-07 NOTE — Telephone Encounter (Signed)
[facility] Night - Client TELEPHONE ADVICE RECORD [facility] Patient Name: Scott [name] Gender: Unknown DOB: [birth year] Age: 79 Y 4 M 24 D Return Phone Number: [phone] (Primary), [phone] (Secondary) [place] [address] Client [facility] Night - Client Client Site [place] Physician Owens Loffler - MD Contact Type Call Who Is Calling Patient / Member / Family / Caregiver Call Type Triage / Clinical Relationship To Patient Self Return Phone Number [phone] (Primary) Chief Complaint Headache Reason for Call Symptomatic / Request for [place] reports that he has a sore throat, congestion, fever, and a headache. Translation No Nurse Assessment Nurse: Verita Schneiders, RN, April Date/Time (Eastern Time): 09/04/2020 11:12:10 AM Confirm and document reason for call. If symptomatic, describe symptoms. ---Caller reports that he has a sore throat, congestion, fever, and a headache. S/sx started yesterday. Took home COVID test-- positive. Most recent temperature 100 (temporal). Does the patient have any new or worsening symptoms? ---Yes Will a triage be completed? ---Yes Related visit to physician within the last 2 weeks? ---No Does the PT have any chronic conditions? (i.e. diabetes, asthma, this includes High risk factors for pregnancy, etc.) ---No Is this a behavioral health or substance abuse call? ---No Guidelines Guideline Title Affirmed Question Affirmed Notes Nurse Date/Time (Eastern Time) COVID-19 - Diagnosed or Suspected HIGH RISK for severe COVID complications (e.g., age > 66 years, obesity with BMI > 25, pregnant, chronic lung disease or other chronic medical condition) (Exception: Already seen by PCP and no new or worsening symptoms.) Beams, RN, April 09/04/2020 11:17:56 AM Disp. Time Eilene Ghazi Time) Disposition Final  User 09/04/2020 11:22:35 AM Call PCP Now Yes Beams, RN, April PLEASE NOTE: All timestamps contained within this report are represented as [place] Standard Time. CONFIDENTIALTY NOTICE: This fax transmission is intended only for the addressee. It contains information that is legally privileged, confidential or otherwise protected from use or disclosure. If you are not the intended recipient, you are strictly prohibited from reviewing, disclosing, copying using or disseminating any of this information or taking any action in reliance on or regarding this information. If you have received this fax in error, please notify us immediately by telephone so that we can arrange for its return to [place]. Phone: [phone], Toll-Free: [phone], Fax: [phone] Page: 2 of 2 Call Id: [id number] [place] Disagree/Comply Comply Caller Understands Yes PreDisposition Call Doctor Care Advice Given Per Guideline CALL PCP NOW: * You need to discuss this with your doctor (or NP/PA). * ACETAMINOPHEN REGULAR STRENGTH TYLENOL: Take 650 mg (two 325 mg pills) by mouth every 4-6 hours as needed. Each Regular Strength Tylenol pill has 325 mg of acetaminophen. The most you should take each day is 3,250 mg (10 pills a day). * IBUPROFEN (E.G., MOTRIN, ADVIL): Take 400 mg (two 200 mg pills) by mouth every 6 hours. The most you should take each day is 1,200 mg (six 200 mg pills), unless your doctor has told you to take more. GENERAL CARE ADVICE FOR COVID-19 SYMPTOMS: * Feeling dehydrated: Drink extra liquids. If the air in your home is dry, use a humidifier. CALL BACK IF: * You become worse CARE ADVICE given per COVID-19 - DIAGNOSED OR SUSPECTED (Adult) guideline. * HOME REMEDY - HARD CANDY: Hard candy works just as well as over-the-counter cough drops. People who have diabetes should use sugar-free candy. * HOME REMEDY - HONEY: This old home remedy has been shown to help decrease coughing  at night. The adult dosage is 2 teaspoons  (10 ml) at bedtime. Honey should not be given to infants under one year of age. Comments User: April, Beams, RN Date/Time Eilene Ghazi Time): 09/04/2020 11:24:40 AM No on call provider listed. User: April, Beams, RN Date/Time Eilene Ghazi Time): 09/04/2020 11:27:32 AM Instructed him to call office and leave voice mail for online appointment. Also advised him to at least follow up Monday with office. Understands to call back if s/sx become any worse. Referrals GO TO FACILITY UNDECIDED

## 2020-09-07 NOTE — Telephone Encounter (Signed)
[place] Night - Client Nonclinical Telephone Record  [facility] Client [facility] Night - Client Client Site [place] Physician Owens Loffler - MD Contact Type Call Who Is Calling Patient / Member / Family / Caregiver Caller Name Chico Cawood Caller Phone Number [phone] Patient Name Promise Bushong Patient DOB 06/19/1941 Call Type Message Only Information Provided Reason for Call Request to Schedule Office Appointment Initial Comment Caller states he has a ear aches, sore throat, nasal congestion, head aches, body aches. Caller wants to make appointment to see doctor Additional Comment Caller declined triage. Provided caller with office hours Disp. Time Disposition Final User 09/04/2020 11:40:59 AM General Information Provided Yes Raliegh Scarlet Call Closed By: Raliegh Scarlet Transaction Date/Time: 09/04/2020 11:34:58 AM (ET)

## 2020-09-07 NOTE — Telephone Encounter (Signed)
Contacted pt and advised you can now order free at-home covid tests through the website, www.covidtests.gov Each residential address will get 4 free tests. Advised this can take 7-12 days.   Advised pt of free tests. Pt appreciative and verbalized understanding .

## 2020-09-18 ENCOUNTER — Other Ambulatory Visit: Payer: Self-pay | Admitting: Family Medicine

## 2020-11-22 ENCOUNTER — Other Ambulatory Visit: Payer: Self-pay | Admitting: Family Medicine

## 2020-11-22 NOTE — Telephone Encounter (Signed)
Spoke with Scott [name].  He states he does take the atorvastatin 40 mg.  Last Lipid 06/18/2019.  Ok to refill?

## 2020-11-22 NOTE — Telephone Encounter (Signed)
Last office visit 06/30/2020 for heartburn.  Atorvastatin is not on current medication list.  Refill?

## 2020-11-22 NOTE — Telephone Encounter (Signed)
We should check with him  I actually think it is a good idea for him to go back on it, but he had wanted to use red yeast rice the last time that I saw him.

## 2020-12-23 DIAGNOSIS — M65351 Trigger finger, right little finger: Secondary | ICD-10-CM | POA: Diagnosis not present

## 2020-12-23 DIAGNOSIS — G5601 Carpal tunnel syndrome, right upper limb: Secondary | ICD-10-CM | POA: Diagnosis not present

## 2020-12-23 DIAGNOSIS — M65331 Trigger finger, right middle finger: Secondary | ICD-10-CM | POA: Diagnosis not present

## 2020-12-23 DIAGNOSIS — M19131 Post-traumatic osteoarthritis, right wrist: Secondary | ICD-10-CM | POA: Diagnosis not present

## 2021-02-23 ENCOUNTER — Telehealth: Payer: Self-pay | Admitting: Family Medicine

## 2021-02-23 NOTE — Telephone Encounter (Signed)
Please try and schedule MWV with Calandra and CPE with fasting labs prior with Dr. Lorelei Pont.

## 2021-02-24 NOTE — Telephone Encounter (Signed)
Patient is scheduled   

## 2021-02-24 NOTE — Telephone Encounter (Signed)
Spoke with patient scheduled Wilson with nurse CPE with fasting labs

## 2021-03-01 ENCOUNTER — Other Ambulatory Visit: Payer: Self-pay | Admitting: Family Medicine

## 2021-03-01 DIAGNOSIS — C61 Malignant neoplasm of prostate: Secondary | ICD-10-CM

## 2021-03-01 DIAGNOSIS — R7309 Other abnormal glucose: Secondary | ICD-10-CM

## 2021-03-01 DIAGNOSIS — Z79899 Other long term (current) drug therapy: Secondary | ICD-10-CM

## 2021-03-01 DIAGNOSIS — Z1159 Encounter for screening for other viral diseases: Secondary | ICD-10-CM

## 2021-03-01 DIAGNOSIS — E785 Hyperlipidemia, unspecified: Secondary | ICD-10-CM

## 2021-03-07 ENCOUNTER — Other Ambulatory Visit: Payer: Self-pay

## 2021-03-07 ENCOUNTER — Other Ambulatory Visit (INDEPENDENT_AMBULATORY_CARE_PROVIDER_SITE_OTHER): Payer: Medicare HMO

## 2021-03-07 DIAGNOSIS — E785 Hyperlipidemia, unspecified: Secondary | ICD-10-CM | POA: Diagnosis not present

## 2021-03-07 DIAGNOSIS — Z79899 Other long term (current) drug therapy: Secondary | ICD-10-CM

## 2021-03-07 DIAGNOSIS — Z1159 Encounter for screening for other viral diseases: Secondary | ICD-10-CM

## 2021-03-07 DIAGNOSIS — C61 Malignant neoplasm of prostate: Secondary | ICD-10-CM

## 2021-03-07 DIAGNOSIS — R7309 Other abnormal glucose: Secondary | ICD-10-CM | POA: Diagnosis not present

## 2021-03-07 LAB — HEPATIC FUNCTION PANEL
ALT: 20 U/L (ref 0–53)
AST: 17 U/L (ref 0–37)
Albumin: 4.5 g/dL (ref 3.5–5.2)
Alkaline Phosphatase: 61 U/L (ref 39–117)
Bilirubin, Direct: 0.2 mg/dL (ref 0.0–0.3)
Total Bilirubin: 0.8 mg/dL (ref 0.2–1.2)
Total Protein: 6.5 g/dL (ref 6.0–8.3)

## 2021-03-07 LAB — CBC WITH DIFFERENTIAL/PLATELET
Basophils Absolute: 0 10*3/uL (ref 0.0–0.1)
Basophils Relative: 0.5 % (ref 0.0–3.0)
Eosinophils Absolute: 0.2 10*3/uL (ref 0.0–0.7)
Eosinophils Relative: 3.3 % (ref 0.0–5.0)
HCT: 45.2 % (ref 39.0–52.0)
Hemoglobin: 15.3 g/dL (ref 13.0–17.0)
Lymphocytes Relative: 21.8 % (ref 12.0–46.0)
Lymphs Abs: 1 10*3/uL (ref 0.7–4.0)
MCHC: 33.8 g/dL (ref 30.0–36.0)
MCV: 90 fl (ref 78.0–100.0)
Monocytes Absolute: 0.5 10*3/uL (ref 0.1–1.0)
Monocytes Relative: 10.9 % (ref 3.0–12.0)
Neutro Abs: 3 10*3/uL (ref 1.4–7.7)
Neutrophils Relative %: 63.5 % (ref 43.0–77.0)
Platelets: 171 10*3/uL (ref 150.0–400.0)
RBC: 5.02 Mil/uL (ref 4.22–5.81)
RDW: 14.3 % (ref 11.5–15.5)
WBC: 4.7 10*3/uL (ref 4.0–10.5)

## 2021-03-07 LAB — BASIC METABOLIC PANEL
BUN: 22 mg/dL (ref 6–23)
CO2: 28 mEq/L (ref 19–32)
Calcium: 9.6 mg/dL (ref 8.4–10.5)
Chloride: 105 mEq/L (ref 96–112)
Creatinine, Ser: 1.08 mg/dL (ref 0.40–1.50)
GFR: 65.97 mL/min (ref >60.00)
Glucose, Bld: 103 mg/dL — ABNORMAL HIGH (ref 70–99)
Potassium: 4.3 mEq/L (ref 3.5–5.1)
Sodium: 140 mEq/L (ref 135–145)

## 2021-03-07 LAB — LIPID PANEL
Cholesterol: 118 mg/dL (ref 0–200)
HDL: 32.9 mg/dL — ABNORMAL LOW (ref >39.00)
LDL Cholesterol: 64 mg/dL (ref 0–99)
NonHDL: 85.31
Total CHOL/HDL Ratio: 4
Triglycerides: 106 mg/dL (ref 0.0–149.0)
VLDL: 21.2 mg/dL (ref 0.0–40.0)

## 2021-03-07 LAB — PSA, MEDICARE: PSA: 0.01 ng/ml — ABNORMAL LOW (ref 0.10–4.00)

## 2021-03-07 LAB — HEMOGLOBIN A1C: Hgb A1c MFr Bld: 6.3 % (ref 4.6–6.5)

## 2021-03-08 LAB — HEPATITIS C ANTIBODY
Hepatitis C Ab: NONREACTIVE
SIGNAL TO CUT-OFF: 0.4 (ref <1.00)

## 2021-03-10 ENCOUNTER — Ambulatory Visit (INDEPENDENT_AMBULATORY_CARE_PROVIDER_SITE_OTHER): Payer: Medicare HMO

## 2021-03-10 DIAGNOSIS — Z0000 Encounter for general adult medical examination without abnormal findings: Secondary | ICD-10-CM | POA: Diagnosis not present

## 2021-03-10 NOTE — Patient Instructions (Signed)
Scott [name] , Thank you for taking time to come for your Medicare Wellness Visit. I appreciate your ongoing commitment to your health goals. Please review the following plan we discussed and let me know if I can assist you in the future.   Screening recommendations/referrals: Colonoscopy: no longer required  Recommended yearly ophthalmology/optometry visit for glaucoma screening and checkup Recommended yearly dental visit for hygiene and checkup  Vaccinations: Influenza vaccine: Up to date, completed 06/04/2020, due 82022 Pneumococcal vaccine: Completed series Tdap vaccine: decline-insurance  Shingles vaccine: due, check with your insurance regarding coverage if interested    Covid-19: completed 2 vaccines   Advanced directives: Advance directive discussed with you today. Even though you declined this today please call our office should you change your mind and we can give you the proper paperwork for you to fill out.  Conditions/risks identified: hyperlipidemia   Next appointment: Follow up in one year for your annual wellness visit.   Preventive Care 50 Years and Older, Male Preventive care refers to lifestyle choices and visits with your health care provider that can promote health and wellness. What does preventive care include? A yearly physical exam. This is also called an annual well check. Dental exams once or twice a year. Routine eye exams. Ask your health care provider how often you should have your eyes checked. Personal lifestyle choices, including: Daily care of your teeth and gums. Regular physical activity. Eating a healthy diet. Avoiding tobacco and drug use. Limiting alcohol use. Practicing safe sex. Taking low doses of aspirin every day. Taking vitamin and mineral supplements as recommended by your health care provider. What happens during an annual well check? The services and screenings done by your health care provider during your annual well check will  depend on your age, overall health, lifestyle risk factors, and family history of disease. Counseling  Your health care provider may ask you questions about your: Alcohol use. Tobacco use. Drug use. Emotional well-being. Home and relationship well-being. Sexual activity. Eating habits. History of falls. Memory and ability to understand (cognition). Work and work Statistician. Screening  You may have the following tests or measurements: Height, weight, and BMI. Blood pressure. Lipid and cholesterol levels. These may be checked every 5 years, or more frequently if you are over 84 years old. Skin check. Lung cancer screening. You may have this screening every year starting at age 18 if you have a 30-pack-year history of smoking and currently smoke or have quit within the past 15 years. Fecal occult blood test (FOBT) of the stool. You may have this test every year starting at age 22. Flexible sigmoidoscopy or colonoscopy. You may have a sigmoidoscopy every 5 years or a colonoscopy every 10 years starting at age 105. Prostate cancer screening. Recommendations will vary depending on your family history and other risks. Hepatitis C blood test. Hepatitis B blood test. Sexually transmitted disease (STD) testing. Diabetes screening. This is done by checking your blood sugar (glucose) after you have not eaten for a while (fasting). You may have this done every 1-3 years. Abdominal aortic aneurysm (AAA) screening. You may need this if you are a current or former smoker. Osteoporosis. You may be screened starting at age 72 if you are at high risk. Talk with your health care provider about your test results, treatment options, and if necessary, the need for more tests. Vaccines  Your health care provider may recommend certain vaccines, such as: Influenza vaccine. This is recommended every year. Tetanus, diphtheria, and acellular  pertussis (Tdap, Td) vaccine. You may need a Td booster every 10  years. Zoster vaccine. You may need this after age 87. Pneumococcal 13-valent conjugate (PCV13) vaccine. One dose is recommended after age 51. Pneumococcal polysaccharide (PPSV23) vaccine. One dose is recommended after age 65. Talk to your health care provider about which screenings and vaccines you need and how often you need them. This information is not intended to replace advice given to you by your health care provider. Make sure you discuss any questions you have with your health care provider. Document Released: 09/03/2015 Document Revised: 04/26/2016 Document Reviewed: 06/08/2015 Elsevier Interactive Patient Education  2017 [employer] Prevention in the Home Falls can cause injuries. They can happen to people of all ages. There are many things you can do to make your home safe and to help prevent falls. What can I do on the outside of my home? Regularly fix the edges of walkways and driveways and fix any cracks. Remove anything that might make you trip as you walk through a door, such as a raised step or threshold. Trim any bushes or trees on the path to your home. Use bright outdoor lighting. Clear any walking paths of anything that might make someone trip, such as rocks or tools. Regularly check to see if handrails are loose or broken. Make sure that both sides of any steps have handrails. Any raised decks and porches should have guardrails on the edges. Have any leaves, snow, or ice cleared regularly. Use sand or salt on walking paths during winter. Clean up any spills in your garage right away. This includes oil or grease spills. What can I do in the bathroom? Use night lights. Install grab bars by the toilet and in the tub and shower. Do not use towel bars as grab bars. Use non-skid mats or decals in the tub or shower. If you need to sit down in the shower, use a plastic, non-slip stool. Keep the floor dry. Clean up any water that spills on the floor as soon as it  happens. Remove soap buildup in the tub or shower regularly. Attach bath mats securely with double-sided non-slip rug tape. Do not have throw rugs and other things on the floor that can make you trip. What can I do in the bedroom? Use night lights. Make sure that you have a light by your bed that is easy to reach. Do not use any sheets or blankets that are too big for your bed. They should not hang down onto the floor. Have a firm chair that has side arms. You can use this for support while you get dressed. Do not have throw rugs and other things on the floor that can make you trip. What can I do in the kitchen? Clean up any spills right away. Avoid walking on wet floors. Keep items that you use a lot in easy-to-reach places. If you need to reach something above you, use a strong step stool that has a grab bar. Keep electrical cords out of the way. Do not use floor polish or wax that makes floors slippery. If you must use wax, use non-skid floor wax. Do not have throw rugs and other things on the floor that can make you trip. What can I do with my stairs? Do not leave any items on the stairs. Make sure that there are handrails on both sides of the stairs and use them. Fix handrails that are broken or loose. Make sure that handrails  are as long as the stairways. Check any carpeting to make sure that it is firmly attached to the stairs. Fix any carpet that is loose or worn. Avoid having throw rugs at the top or bottom of the stairs. If you do have throw rugs, attach them to the floor with carpet tape. Make sure that you have a light switch at the top of the stairs and the bottom of the stairs. If you do not have them, ask someone to add them for you. What else can I do to help prevent falls? Wear shoes that: Do not have high heels. Have rubber bottoms. Are comfortable and fit you well. Are closed at the toe. Do not wear sandals. If you use a stepladder: Make sure that it is fully opened.  Do not climb a closed stepladder. Make sure that both sides of the stepladder are locked into place. Ask someone to hold it for you, if possible. Clearly mark and make sure that you can see: Any grab bars or handrails. First and last steps. Where the edge of each step is. Use tools that help you move around (mobility aids) if they are needed. These include: Canes. Walkers. Scooters. Crutches. Turn on the lights when you go into a dark area. Replace any light bulbs as soon as they burn out. Set up your furniture so you have a clear path. Avoid moving your furniture around. If any of your floors are uneven, fix them. If there are any pets around you, be aware of where they are. Review your medicines with your doctor. Some medicines can make you feel dizzy. This can increase your chance of falling. Ask your doctor what other things that you can do to help prevent falls. This information is not intended to replace advice given to you by your health care provider. Make sure you discuss any questions you have with your health care provider. Document Released: 06/03/2009 Document Revised: 01/13/2016 Document Reviewed: 09/11/2014 Elsevier Interactive Patient Education  2017 [employer].

## 2021-03-10 NOTE — Progress Notes (Signed)
PCP notes:  Health Maintenance: Shingrix- due    Abnormal Screenings: none   Patient concerns: none   Nurse concerns: none   Next PCP appt.: 03/16/2021 @ 8:40 am

## 2021-03-10 NOTE — Progress Notes (Signed)
Subjective:   Scott [name] is a 80 y.o. male who presents for Medicare Annual/Subsequent preventive examination.  Review of Systems: N/A      I connected with the patient today by telephone and verified that I am speaking with the correct person using two identifiers. Location patient: home Location nurse: work Persons participating in the telephone visit: patient, nurse.   I discussed the limitations, risks, security and privacy concerns of performing an evaluation and management service by telephone and the availability of in person appointments. I also discussed with the patient that there may be a patient responsible charge related to this service. The patient expressed understanding and verbally consented to this telephonic visit.        Cardiac Risk Factors include: advanced age (>62men, >83 women);male gender;Other (see comment), Risk factor comments: hyperlipidemia     Objective:    Today's Vitals   There is no height or weight on file to calculate BMI.  Advanced Directives 03/10/2021 01/05/2020 01/03/2020 01/02/2020 06/18/2019 06/18/2019 05/30/2018  Does Patient Have a Medical Advance Directive? No No No No Yes No Yes  Type of Advance Directive - - - - Living will - [place];Living will  Does patient want to make changes to medical advance directive? No - Patient declined - - - No - Patient declined - No - Patient declined  Copy of [place] in Chart? - - - - - - No - copy requested  Would patient like information on creating a medical advance directive? - No - Patient declined No - Patient declined No - Patient declined No - Patient declined No - Patient declined -    Current Medications (verified) Outpatient Encounter Medications as of 03/10/2021  Medication Sig   acetaminophen (TYLENOL) 325 MG tablet Take 2 tablets (650 mg total) by mouth every 6 (six) hours as needed for mild pain (or temp > 37.5 C (99.5 F)).   aspirin 81 MG  tablet Take 1 tablet (81 mg total) by mouth daily. Take aspirin 81 mg once daily by mouth for 3 weeks(21 days) and then stop   atorvastatin (LIPITOR) 40 MG tablet TAKE ONE TABLET EVERY DAY AT 6PM   cyanocobalamin 2000 MCG tablet Take 2,000 mcg by mouth daily.   meloxicam (MOBIC) 15 MG tablet Take 15 mg by mouth daily as needed.   pantoprazole (PROTONIX) 40 MG tablet Take 1 tablet (40 mg total) by mouth daily.   Red Yeast Rice Extract (RED YEAST RICE PO) Take 900 mg by mouth every morning.   vitamin E 180 MG (400 UNITS) capsule Take 400 Units by mouth daily.   No facility-administered encounter medications on file as of 03/10/2021.    Allergies (verified) Patient has no known allergies.   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   GERD (gastroesophageal reflux disease)    Hyperlipidemia    Prostate cancer (HCC)    cancer   Right bundle branch block    RBBB   Past Surgical History:  Procedure Laterality Date   APPENDECTOMY     HERNIA REPAIR  85,03   rt and lt ing hernia   KNEE ARTHROSCOPY  2012   left   LEFT HEART CATH AND CORONARY ANGIOGRAPHY N/A 05/30/2018   Procedure: LEFT HEART CATH AND CORONARY ANGIOGRAPHY;  Surgeon: Wellington Hampshire, MD;  Location: [facility]  INVASIVE CV LAB;  Service: Cardiovascular;  Laterality: N/A;   prostatectomy  [place]  12/2019   ROTATOR CUFF REPAIR     right   SHOULDER ARTHROSCOPY Right 01/29/2013   Procedure: RIGHT ARTHROSCOPY SHOULDER WITH DEBRIDEMENT, POSSIBLE ROTATOR CUFF REPAIR;  Surgeon: Kerin Salen, MD;  Location: [facility];  Service: Orthopedics;  Laterality: Right;   TONSILLECTOMY     TOTAL HIP ARTHROPLASTY  2007   right    Family History  Problem Relation Age of Onset   Cancer Father        prostate   Cancer Brother        prostate   Cancer  Brother        prostate   Social History   Socioeconomic History   Marital status: Married    Spouse name: Not on file   Number of children: Not on file   Years of education: Not on file   Highest education level: Not on file  Occupational History   Occupation: auctioneer    Employer: RETIRED  Tobacco Use   Smoking status: Never   Smokeless tobacco: Never  Substance and Sexual Activity   Alcohol use: No   Drug use: No   Sexual activity: Not on file  Other Topics Concern   Not on file  Social History Narrative   Former competitive bodybuilder, Geophysicist/field seismologist, all natural   [employer], [place], [employer], multiple years   Social Determinants of Radio broadcast assistant Strain: Low Risk    Difficulty of Paying Living Expenses: Not hard at all  Food Insecurity: No Food Insecurity   Worried About Charity fundraiser in the Last Year: Never true   Arboriculturist in the Last Year: Never true  Transportation Needs: No Transportation Needs   Lack of Transportation (Medical): No   Lack of Transportation (Non-Medical): No  Physical Activity: Sufficiently Active   Days of Exercise per Week: 4 days   Minutes of Exercise per Session: 50 min  Stress: No Stress Concern Present   Feeling of Stress : Not at all  Social Connections: Not on file    Tobacco Counseling Counseling given: Not Answered   Clinical Intake:  Pre-visit preparation completed: Yes  Pain : No/denies pain     Nutritional Risks: None Diabetes: No  How often do you need to have someone help you when you read instructions, pamphlets, or other written materials from your doctor or pharmacy?: 1 - Never  Diabetic: No Nutrition Risk Assessment:  Has the patient had any N/V/D within the last 2 months?  No  Does the patient have any non-healing wounds?  No  Has the patient had any unintentional weight loss or weight gain?  No   Diabetes:  Is the patient diabetic?  No  If diabetic, was a  CBG obtained today?   N/A Did the patient bring in their glucometer from home?   N/A How often do you monitor your CBG's? N/A.   Financial Strains and Diabetes Management:  Are you having any financial strains with the device, your supplies or your medication? No .  Does the patient want to be seen by Chronic Care Management for management of their diabetes?  No  Would the patient like to be referred to a Nutritionist or for Diabetic Management?  No     Interpreter Needed?: No  Information entered by :: CJohnson, RN   Activities of Daily  Living In your present state of health, do you have any difficulty performing the following activities: 03/10/2021  Hearing? N  Vision? N  Difficulty concentrating or making decisions? N  Walking or climbing stairs? N  Dressing or bathing? N  Doing errands, shopping? N  Preparing Food and eating ? N  Using the Toilet? N  In the past six months, have you accidently leaked urine? N  Do you have problems with loss of bowel control? N  Managing your Medications? N  Managing your Finances? N  Housekeeping or managing your Housekeeping? N  Some recent data might be hidden    Patient Care Team: Owens Loffler, MD as PCP - General Wellington Hampshire, MD as PCP - Cardiology (Cardiology) Desiree Hane, MD as Attending Physician (Internal Medicine) Alla Feeling, [place] Team 7, MD as Rounding Team (Internal Medicine)  Indicate any recent Medical Services you may have received from other than [facility] providers in the past year (date may be approximate).     Assessment:   This is a routine wellness examination for K[place].  Hearing/Vision screen Vision Screening - Comments:: Advised to get annual eye exams   Dietary issues and exercise activities discussed: Current Exercise Habits: Home exercise routine, Type of exercise: strength training/weights, Time (Minutes): 45, Frequency (Times/Week): 3, Weekly Exercise (Minutes/Week): 135, Intensity: Moderate,  Exercise limited by: None identified   Goals Addressed             This Visit's Progress    Patient Stated       03/10/2021, I will continue to exercise for at least 30-45 minutes 3-4 days per week.       Depression Screen PHQ 2/9 Scores 03/10/2021 10/17/2018 09/10/2017 08/10/2016 05/05/2015 11/26/2013  PHQ - 2 Score 0 0 2 2 0 0  PHQ- 9 Score 0 - 5 11 - -    Fall Risk Fall Risk  03/10/2021 10/17/2018 09/10/2017 08/10/2016 07/28/2016  Falls in the past year? 0 0 No No No  Comment - - - - Emmi Telephone Survey: data to providers prior to load  Number falls in past yr: 0 0 - - -  Injury with Fall? 0 0 - - -  Risk for fall due to : Medication side effect - - - -  Follow up Falls evaluation completed;Falls prevention discussed - - - -    FALL RISK PREVENTION PERTAINING TO THE HOME:  Any stairs in or around the home? Yes  If so, are there any without handrails? No  Home free of loose throw rugs in walkways, pet beds, electrical cords, etc? Yes  Adequate lighting in your home to reduce risk of falls? Yes   ASSISTIVE DEVICES UTILIZED TO PREVENT FALLS:  Life alert? No  Use of a cane, walker or w/c? No  Grab bars in the bathroom? No  Shower chair or bench in shower? No  Elevated toilet seat or a handicapped toilet? No   TIMED UP AND GO:  Was the test performed?  N/A telephone visit  .    Cognitive Function: MMSE - Mini Mental State Exam 03/10/2021 09/10/2017  Not completed: Unable to complete -  Orientation to time - 5  Orientation to Place - 5  Registration - 3  Attention/ Calculation - 0  Recall - 3  Language- name 2 objects - 0  Language- repeat - 1  Language- follow 3 step command - 3  Language- read & follow direction - 0  Write a sentence - 0  Copy  design - 0  Total score - 20  Mini Cog  Mini-Cog screen was not completed. Unable to complete at this time. Maximum score is 22. A value of 0 denotes this part of the MMSE was not completed or the patient failed this part  of the Mini-Cog screening.       Immunizations Immunization History  Administered Date(s) Administered   Influenza Split 06/13/2011   Influenza, High Dose Seasonal PF 05/09/2018, 04/30/2019   Influenza,inj,Quad PF,6+ Mos 05/05/2015   Influenza-Unspecified 06/01/2014, 06/21/2017, 06/04/2020   PFIZER(Purple Top)SARS-COV-2 Vaccination 09/05/2019, 10/06/2019   Pneumococcal Conjugate-13 11/26/2013   Pneumococcal Polysaccharide-23 05/05/2015    TDAP status: Due, Education has been provided regarding the importance of this vaccine. Advised may receive this vaccine at local pharmacy or Health Dept. Aware to provide a copy of the vaccination record if obtained from local pharmacy or Health Dept. Verbalized acceptance and understanding.  Flu Vaccine status: Up to date  Pneumococcal vaccine status: Up to date  Covid-19 vaccine status: Completed 2 vaccines  Qualifies for Shingles Vaccine? Yes   Zostavax completed No   Shingrix Completed?: No.    Education has been provided regarding the importance of this vaccine. Patient has been advised to call insurance company to determine out of pocket expense if they have not yet received this vaccine. Advised may also receive vaccine at local pharmacy or Health Dept. Verbalized acceptance and understanding.  Screening Tests Health Maintenance  Topic Date Due   TETANUS/TDAP  Never done   Zoster Vaccines- Shingrix (1 of 2) Never done   COLONOSCOPY (Pts 45-66yrs Insurance coverage will need to be confirmed)  01/20/2019   COVID-19 Vaccine (3 - Pfizer risk series) 11/03/2019   INFLUENZA VACCINE  03/21/2021   Hepatitis C Screening  Completed   PNA vac Low Risk Adult  Completed   HPV VACCINES  Aged Out    Health Maintenance  Health Maintenance Due  Topic Date Due   TETANUS/TDAP  Never done   Zoster Vaccines- Shingrix (1 of 2) Never done   COLONOSCOPY (Pts 45-25yrs Insurance coverage will need to be confirmed)  01/20/2019   COVID-19 Vaccine (3 -  Pfizer risk series) 11/03/2019    Colorectal cancer screening: no longer required  Lung Cancer Screening: (Low Dose CT Chest recommended if Age 47-80 years, 30 pack-year currently smoking OR have quit w/in 15years.) does not qualify.    Additional Screening:  Hepatitis C Screening: does qualify; Completed 03/07/2021  Vision Screening: Recommended annual ophthalmology exams for early detection of glaucoma and other disorders of the eye. Is the patient up to date with their annual eye exam?  No  Who is the provider or what is the name of the office in which the patient attends annual eye exams? Has not seen eye doctor in many years  If pt is not established with a provider, would they like to be referred to a provider to establish care? No .   Dental Screening: Recommended annual dental exams for proper oral hygiene  Community Resource Referral / Chronic Care Management: CRR required this visit?  No   CCM required this visit?  No      Plan:     I have personally reviewed and noted the following in the patient's chart:   Medical and social history Use of alcohol, tobacco or illicit drugs  Current medications and supplements including opioid prescriptions. Patient is not currently taking opioid prescriptions. Functional ability and status Nutritional status Physical activity Advanced directives List of  other physicians Hospitalizations, surgeries, and ER visits in previous 12 months Vitals Screenings to include cognitive, depression, and falls Referrals and appointments  In addition, I have reviewed and discussed with patient certain preventive protocols, quality metrics, and best practice recommendations. A written personalized care plan for preventive services as well as general preventive health recommendations were provided to patient.   Due to this being a telephonic visit, the after visit summary with patients personalized plan was offered to patient via office or  my-chart. Patient preferred to pick up at office at next visit or via mychart.   Andrez Grime, LPN   [id number]

## 2021-03-16 ENCOUNTER — Ambulatory Visit (INDEPENDENT_AMBULATORY_CARE_PROVIDER_SITE_OTHER): Payer: Medicare HMO | Admitting: Family Medicine

## 2021-03-16 ENCOUNTER — Other Ambulatory Visit: Payer: Self-pay

## 2021-03-16 ENCOUNTER — Encounter: Payer: Self-pay | Admitting: Family Medicine

## 2021-03-16 VITALS — BP 120/80 | HR 65 | Temp 98.5°F | Ht 68.0 in | Wt 173.5 lb

## 2021-03-16 DIAGNOSIS — Z0000 Encounter for general adult medical examination without abnormal findings: Secondary | ICD-10-CM

## 2021-03-16 MED ORDER — TRAZODONE HCL 50 MG PO TABS: 50.0000 mg | ORAL_TABLET | Freq: Every evening | ORAL | 1 refills | Status: DC | PRN

## 2021-03-16 NOTE — Progress Notes (Signed)
Hamdi Kley T. Domenique Southers, MD, [place] at [facility] [place], [address]  Phone: [phone]  FAX: [phone]  Scott [name] - 80 y.o. male  MRN [id number]  Date of Birth: Aug 14, 1941  Date: 03/16/2021  PCP: Owens Loffler, MD  Referral: Owens Loffler, MD  Chief Complaint  Patient presents with   Annual Exam    Part 2    This visit occurred during the SARS-CoV-2 public health emergency.  Safety protocols were in place, including screening questions prior to the visit, additional usage of staff PPE, and extensive cleaning of exam room while observing appropriate contact time as indicated for disinfecting solutions.   Patient Care Team: Owens Loffler, MD as PCP - General Wellington Hampshire, MD as PCP - Cardiology (Cardiology) Desiree Hane, MD as Attending Physician (Internal Medicine) Alla Feeling, [place] Team 7, MD as Rounding Team (Internal Medicine) Subjective:   Scott [name] is a 80 y.o. pleasant patient who presents with the following:  Preventative Health Maintenance Visit:  Health Maintenance Summary Reviewed and updated, unless pt declines services.  Tobacco History Reviewed. Alcohol: No concerns, no excessive use Exercise Habits: daily STD concerns: no risk or activity to increase risk Drug Use: None  Shingles vaccine - not sure if he wants it.  Colon - will not get per Dr. Christia Reading #3, he wants to hold off  Not sleeping - maybe 5 hours of sleep a night.  OTC did not help    Health Maintenance  Topic Date Due   TETANUS/TDAP  Never done   Zoster Vaccines- Shingrix (1 of 2) Never done   COVID-19 Vaccine (3 - Pfizer risk series) 11/03/2019   COLONOSCOPY (Pts 45-74yr Insurance coverage will need to be confirmed)  02/18/2049 (Originally 02/19/2019)   INFLUENZA VACCINE  03/21/2021   Hepatitis C Screening  Completed   PNA vac Low Risk Adult  Completed   HPV VACCINES  Aged Out    Immunization History  Administered Date(s) Administered   Influenza Split 06/13/2011   Influenza, High Dose Seasonal PF 05/09/2018, 04/30/2019   Influenza,inj,Quad PF,6+ Mos 05/05/2015   Influenza-Unspecified 06/01/2014, 06/21/2017, 06/04/2020   PFIZER(Purple Top)SARS-COV-2 Vaccination 09/05/2019, 10/06/2019   Pneumococcal Conjugate-13 11/26/2013   Pneumococcal Polysaccharide-23 05/05/2015   Patient Active Problem List   Diagnosis Date Noted   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 ([place] 06/23/2019    Priority: High   Gallstone pancreatitis 01/04/2020   History of CVA (cerebrovascular accident) without residual deficits 01/03/2020   Unstable angina (HCC)    Prostate cancer, in remission    Right bundle branch block    AV BLOCK, 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   Hyperlipidemia LDL goal <100 04/28/2010   ALLERGIC RHINITIS 04/28/2010   GERD 04/28/2010   Erectile dysfunction following radical prostatectomy 04/28/2010    Past Medical History:  Diagnosis Date   Allergic rhinitis due to pollen    Arthritis    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 ([place] 06/23/2019   GERD (gastroesophageal reflux disease)    Hyperlipidemia    [place]state cancer ([place]    cancer   Right bundle branch block    RBBB    Past Surgical History:  Procedure Laterality Date   APPENDECTOMY     HERNIA REPAIR  85,03   rt and lt ing hernia   KNEE ARTHROSCOPY  2012   left   LEFT HEART CATH AND CORONARY ANGIOGRAPHY N/A 05/30/2018   Procedure: LEFT HEART CATH  AND CORONARY ANGIOGRAPHY;  Surgeon: Wellington Hampshire, MD;  Location: [facility];  Service: Cardiovascular;  Laterality: N/A;   prostatectomy  [place]  12/2019   ROTATOR CUFF REPAIR     right   SHOULDER ARTHROSCOPY Right 01/29/2013   Procedure: RIGHT ARTHROSCOPY SHOULDER WITH DEBRIDEMENT, POSSIBLE ROTATOR CUFF REPAIR;  Surgeon: Kerin Salen, MD;  Location: [place];  Service: Orthopedics;  Laterality: Right;   TONSILLECTOMY     TOTAL HIP ARTHROPLASTY  2007   right     Family History  Problem Relation Age of Onset   Cancer Father        prostate   Cancer Brother        prostate   Cancer Brother        prostate    Past Medical History, Surgical History, Social History, Family History, Problem List, Medications, and Allergies have been reviewed and updated if relevant.  Review of Systems: Pertinent positives are listed above.  Otherwise, a full 14 point review of systems has been done in full and it is negative except where it is noted positive.  Objective:   BP 120/80   Pulse 65   Temp 98.5 F (36.9 C) (Temporal)   Ht '5\' 8"'$  (1.727 m)   Wt 173 lb 8 oz (78.7 kg)   SpO2 99%   BMI 26.38 kg/m  Ideal Body Weight: Weight in (lb) to have BMI = 25: 164.1  Ideal Body Weight: Weight in (lb) to have BMI = 25: 164.1 No results found. Depression screen [facility] 2/9 03/10/2021 10/17/2018 09/10/2017 08/10/2016 05/05/2015  Decreased Interest 0 0 2 0 0  Down, Depressed, Hopeless 0 0 0 2 0  PHQ - 2 Score 0 0 2 2 0  Altered sleeping 0 - 1 3 -  Tired, decreased energy 0 - 1 3 -  Change in appetite 0 - 0 0 -  Feeling bad or failure about yourself  0 - 1 2 -  Trouble concentrating 0 - 0 1 -  Moving slowly or fidgety/restless 0 - 0 0 -  Suicidal thoughts 0 - 0 0 -  PHQ-9 Score 0 - 5 11 -  Difficult doing work/chores Not difficult at all - Not difficult at all Not difficult at all -     GEN: well developed, well nourished, no acute distress Eyes: conjunctiva and lids normal, PERRLA, EOMI ENT: TM clear, nares clear, oral exam WNL Neck: supple, no lymphadenopathy, no thyromegaly, no JVD Pulm: clear to auscultation and percussion, respiratory effort normal CV: regular rate and rhythm, S1-S2, no murmur, rub or gallop, no bruits, peripheral pulses normal and symmetric, no cyanosis, clubbing, edema or varicosities GI: soft, non-tender; no  hepatosplenomegaly, masses; active bowel sounds all quadrants GU: deferred Lymph: no cervical, axillary or inguinal adenopathy MSK: gait normal, muscle tone and strength WNL, no joint swelling, effusions, discoloration, crepitus  SKIN: clear, good turgor, color WNL, no rashes, lesions, or ulcerations Neuro: normal mental status, normal strength, sensation, and motion Psych: alert; oriented to person, place and time, normally interactive and not anxious or depressed in appearance.  All labs reviewed with patient. Results for orders placed or performed in visit on 03/07/21  Hepatitis C antibody  Result Value Ref Range   Hepatitis C Ab NON-REACTIVE NON-REACTIVE   SIGNAL TO CUT-OFF 0.40 <1.00  PSA, Medicare  Result Value Ref Range   PSA 0.01 Repeated and verified X2. (L) 0.10 - 4.00  ng/ml  Hemoglobin A1c  Result Value Ref Range   Hgb A1c MFr Bld 6.3 4.6 - 6.5 %  CBC with Differential/Platelet  Result Value Ref Range   WBC 4.7 4.0 - 10.5 K/uL   RBC 5.02 4.22 - 5.81 Mil/uL   Hemoglobin 15.3 13.0 - 17.0 g/dL   HCT 45.2 39.0 - 52.0 %   MCV 90.0 78.0 - 100.0 fl   MCHC 33.8 30.0 - 36.0 g/dL   RDW 14.3 11.5 - 15.5 %   Platelets 171.0 150.0 - 400.0 K/uL   Neutrophils Relative % 63.5 43.0 - 77.0 %   Lymphocytes Relative 21.8 12.0 - 46.0 %   Monocytes Relative 10.9 3.0 - 12.0 %   Eosinophils Relative 3.3 0.0 - 5.0 %   Basophils Relative 0.5 0.0 - 3.0 %   Neutro Abs 3.0 1.4 - 7.7 K/uL   Lymphs Abs 1.0 0.7 - 4.0 K/uL   Monocytes Absolute 0.5 0.1 - 1.0 K/uL   Eosinophils Absolute 0.2 0.0 - 0.7 K/uL   Basophils Absolute 0.0 0.0 - 0.1 K/uL  Basic metabolic panel  Result Value Ref Range   Sodium 140 135 - 145 mEq/L   Potassium 4.3 3.5 - 5.1 mEq/L   Chloride 105 96 - 112 mEq/L   CO2 28 19 - 32 mEq/L   Glucose, Bld 103 (H) 70 - 99 mg/dL   BUN 22 6 - 23 mg/dL   Creatinine, Ser 1.08 0.40 - 1.50 mg/dL   GFR 65.97 >60.00 mL/min   Calcium 9.6 8.4 - 10.5 mg/dL  Hepatic function panel  Result  Value Ref Range   Total Bilirubin 0.8 0.2 - 1.2 mg/dL   Bilirubin, Direct 0.2 0.0 - 0.3 mg/dL   Alkaline Phosphatase 61 39 - 117 U/L   AST 17 0 - 37 U/L   ALT 20 0 - 53 U/L   Total Protein 6.5 6.0 - 8.3 g/dL   Albumin 4.5 3.5 - 5.2 g/dL  Lipid panel  Result Value Ref Range   Cholesterol 118 0 - 200 mg/dL   Triglycerides 106.0 0.0 - 149.0 mg/dL   HDL 32.90 (L) >39.00 mg/dL   VLDL 21.2 0.0 - 40.0 mg/dL   LDL Cholesterol 64 0 - 99 mg/dL   Total CHOL/HDL Ratio 4    NonHDL 85.31     Assessment and Plan:     ICD-10-CM   1. Healthcare maintenance  Z00.00      He wants to hold on getting his COVID-19 booster as well as shingles vaccination.  He has had some intermittent insomnia, so I gave him some trazodone to use intermittently.  His hemoglobin A1c is elevated now, and he does know he has been eating some sugars and starches more than he often does, so he is going to try to clean up his diet.  Otherwise, he is doing great for 80 year old, and I do not want him to change anything  Health Maintenance Exam: The patient's preventative maintenance and recommended screening tests for an annual wellness exam were reviewed in full today. Brought up to date unless services declined.  Counselled on the importance of diet, exercise, and its role in overall health and mortality. The patient's FH and SH was reviewed, including their home life, tobacco status, and drug and alcohol status.  Follow-up in 1 year for physical exam or additional follow-up below.  Follow-up: No follow-ups on file. Or follow-up in 1 year if not noted.  Meds ordered this encounter  Medications   traZODone (  DESYREL) 50 MG tablet    Sig: Take 1-2 tablets (50-100 mg total) by mouth at bedtime as needed for sleep.    Dispense:  60 tablet    Refill:  1   There are no discontinued medications. No orders of the defined types were placed in this encounter.   Signed,  Maud Deed. Brendan Gadson, MD   Allergies as of  03/16/2021   No Known Allergies      Medication List        Accurate as of March 16, 2021  9:05 AM. If you have any questions, ask your nurse or doctor.          acetaminophen 325 MG tablet Commonly known as: TYLENOL Take 2 tablets (650 mg total) by mouth every 6 (six) hours as needed for mild pain (or temp > 37.5 C (99.5 F)).   aspirin 81 MG tablet Take 1 tablet (81 mg total) by mouth daily. Take aspirin 81 mg once daily by mouth for 3 weeks(21 days) and then stop   atorvastatin 40 MG tablet Commonly known as: LIPITOR TAKE ONE TABLET EVERY DAY AT 6PM   cyanocobalamin 2000 MCG tablet Take 2,000 mcg by mouth daily.   meloxicam 15 MG tablet Commonly known as: MOBIC Take 15 mg by mouth daily as needed.   pantoprazole 40 MG tablet Commonly known as: PROTONIX Take 1 tablet (40 mg total) by mouth daily.   RED YEAST RICE PO Take 900 mg by mouth every morning.   traZODone 50 MG tablet Commonly known as: DESYREL Take 1-2 tablets (50-100 mg total) by mouth at bedtime as needed for sleep. Started by: Owens Loffler, MD   vitamin E 180 MG (400 UNITS) capsule Take 400 Units by mouth daily.

## 2021-04-18 DIAGNOSIS — D2271 Melanocytic nevi of right lower limb, including hip: Secondary | ICD-10-CM | POA: Diagnosis not present

## 2021-04-18 DIAGNOSIS — D1801 Hemangioma of skin and subcutaneous tissue: Secondary | ICD-10-CM | POA: Diagnosis not present

## 2021-04-18 DIAGNOSIS — D485 Neoplasm of uncertain behavior of skin: Secondary | ICD-10-CM | POA: Diagnosis not present

## 2021-04-18 DIAGNOSIS — L812 Freckles: Secondary | ICD-10-CM | POA: Diagnosis not present

## 2021-04-18 DIAGNOSIS — Z85828 Personal history of other malignant neoplasm of skin: Secondary | ICD-10-CM | POA: Diagnosis not present

## 2021-04-18 DIAGNOSIS — C44319 Basal cell carcinoma of skin of other parts of face: Secondary | ICD-10-CM | POA: Diagnosis not present

## 2021-04-18 DIAGNOSIS — L723 Sebaceous cyst: Secondary | ICD-10-CM | POA: Diagnosis not present

## 2021-04-18 DIAGNOSIS — L821 Other seborrheic keratosis: Secondary | ICD-10-CM | POA: Diagnosis not present

## 2021-05-09 DIAGNOSIS — Z85828 Personal history of other malignant neoplasm of skin: Secondary | ICD-10-CM | POA: Diagnosis not present

## 2021-05-09 DIAGNOSIS — C44329 Squamous cell carcinoma of skin of other parts of face: Secondary | ICD-10-CM | POA: Diagnosis not present

## 2021-06-24 ENCOUNTER — Telehealth: Payer: Self-pay | Admitting: Family Medicine

## 2021-06-24 MED ORDER — POLYMYXIN B-TRIMETHOPRIM 10000-0.1 UNIT/ML-% OP SOLN
1.0000 [drp] | Freq: Four times a day (QID) | OPHTHALMIC | 0 refills | Status: AC
Start: 2021-06-24 — End: 2021-07-01

## 2021-06-24 NOTE — Addendum Note (Signed)
Addended by: Owens Loffler on: 06/24/2021 05:13 PM   Modules accepted: Orders

## 2021-06-24 NOTE — Telephone Encounter (Signed)
We discussed.  Classic pink eye. Rx polytrim for him.  No eye scratch or trauma.

## 2021-06-24 NOTE — Telephone Encounter (Signed)
Pt called stating that he thinks he has the pink eye. I told pt that he would need to make am appt. Pt states he do not want to make an appt. Pt states that he needs an antibiotic called in

## 2021-07-07 DIAGNOSIS — M19012 Primary osteoarthritis, left shoulder: Secondary | ICD-10-CM | POA: Diagnosis not present

## 2021-07-07 DIAGNOSIS — S83242A Other tear of medial meniscus, current injury, left knee, initial encounter: Secondary | ICD-10-CM | POA: Diagnosis not present

## 2021-08-18 DIAGNOSIS — H26493 Other secondary cataract, bilateral: Secondary | ICD-10-CM | POA: Diagnosis not present

## 2021-08-18 DIAGNOSIS — H40013 Open angle with borderline findings, low risk, bilateral: Secondary | ICD-10-CM | POA: Diagnosis not present

## 2021-08-18 DIAGNOSIS — H179 Unspecified corneal scar and opacity: Secondary | ICD-10-CM | POA: Diagnosis not present

## 2021-08-18 DIAGNOSIS — H04123 Dry eye syndrome of bilateral lacrimal glands: Secondary | ICD-10-CM | POA: Diagnosis not present

## 2021-09-08 DIAGNOSIS — M19011 Primary osteoarthritis, right shoulder: Secondary | ICD-10-CM | POA: Diagnosis not present

## 2021-09-08 DIAGNOSIS — M19012 Primary osteoarthritis, left shoulder: Secondary | ICD-10-CM | POA: Diagnosis not present

## 2021-09-12 ENCOUNTER — Other Ambulatory Visit: Payer: Self-pay

## 2021-09-12 ENCOUNTER — Encounter: Payer: Self-pay | Admitting: Family Medicine

## 2021-09-12 ENCOUNTER — Telehealth (INDEPENDENT_AMBULATORY_CARE_PROVIDER_SITE_OTHER): Payer: Medicare HMO | Admitting: Family Medicine

## 2021-09-12 VITALS — Ht 68.0 in

## 2021-09-12 DIAGNOSIS — Z20822 Contact with and (suspected) exposure to covid-19: Secondary | ICD-10-CM | POA: Diagnosis not present

## 2021-09-12 NOTE — Progress Notes (Signed)
° ° ° ° °  Scott Carel T. Dung Prien, MD Primary Care and [place] at [facility] [place], [address] Phone: [phone]   FAX: [phone]  Scott [name] - 81 y.o. male   MRN [id number]   Date of Birth: 1941-03-17  Visit Date: 09/12/2021   PCP: Owens Loffler, MD   Referred by: Owens Loffler, MD  Virtual Visit via Video Note:  I connected with  Scott [name] on 09/12/2021 11:00 AM EST by a video enabled telemedicine application and verified that I am speaking with the correct person using two identifiers.   Location patient: home computer, tablet, or smartphone Location provider: work or home office Consent: Verbal consent directly obtained from Scott [name]. Persons participating in the virtual visit: patient, provider  I discussed the limitations of evaluation and management by telemedicine and the availability of in person appointments. The patient expressed understanding and agreed to proceed.  Interactive audio and video telecommunications were attempted between this provider and patient, however failed, due to patient having technical difficulties OR patient did not have access to video capability.  We continued and completed visit with audio only.    Chief Complaint  Patient presents with   Headache    Wife tested positive last night (home test)   Fatigue   Nasal Congestion    History of Present Illness:  He started to feel bad last night, and basically he has a lot of congestion with production of yellowish sputum when he blows his nose.  He also has some nasal congestion and fatigue.  He denies fever, chills, sweats.  He is eating and drinking normally.  He is not having any arthralgias or myalgias.  His wife is sick, and she did have a negative COVID test last night.  They do not have any available COVID test right now at home.    Review of Systems as above: See pertinent positives and pertinent negatives  per HPI No acute distress verbally   Observations/Objective/Exam:  An attempt was made to discern vital signs over the phone and per patient if applicable and possible.   General:    Alert, Oriented, appears well and in no acute distress  Pulmonary:     On inspection no signs of respiratory distress.  Psych / Neurological:     Pleasant and cooperative.  Assessment and Plan:    ICD-10-CM   1. Suspected COVID-19 virus infection  Z20.822      Total encounter time: 12 minutes. This includes total time spent on the day of encounter.    He is going to check himself for COVID this afternoon, then they are going to quarantine.  Recheck on Wednesday.  If at any point positive then start the patient on antivirals.  If negative, treat with supportive care.  I discussed the assessment and treatment plan with the patient. The patient was provided an opportunity to ask questions and all were answered. The patient agreed with the plan and demonstrated an understanding of the instructions.   The patient was advised to call back or seek an in-person evaluation if the symptoms worsen or if the condition fails to improve as anticipated.  Follow-up: prn unless noted otherwise below No follow-ups on file.  No orders of the defined types were placed in this encounter.  No orders of the defined types were placed in this encounter.   Signed,  Maud Deed. Cade Olberding, MD

## 2021-09-21 ENCOUNTER — Other Ambulatory Visit: Payer: Self-pay | Admitting: Family Medicine

## 2021-10-10 DIAGNOSIS — Z85828 Personal history of other malignant neoplasm of skin: Secondary | ICD-10-CM | POA: Diagnosis not present

## 2021-10-10 DIAGNOSIS — L820 Inflamed seborrheic keratosis: Secondary | ICD-10-CM | POA: Diagnosis not present

## 2021-11-15 DIAGNOSIS — S83242A Other tear of medial meniscus, current injury, left knee, initial encounter: Secondary | ICD-10-CM | POA: Diagnosis not present

## 2021-11-16 ENCOUNTER — Telehealth: Payer: Self-pay | Admitting: Family Medicine

## 2021-11-16 MED ORDER — PANTOPRAZOLE SODIUM 40 MG PO TBEC: 40.0000 mg | DELAYED_RELEASE_TABLET | Freq: Every day | ORAL | 3 refills | Status: DC

## 2021-11-16 NOTE — Telephone Encounter (Signed)
?  Encourage patient to contact the pharmacy for refills or they can request refills through [facility] ? ?Did the patient contact the pharmacy:   ? ? ?LAST APPOINTMENT DATE:  1.23.23 ? ?NEXT APPOINTMENT DATE:  8.2.23 ? ?MEDICATION:pantoprazole (PROTONIX) 40 MG tablet ? ?Is the patient out of medication? Y ? ?If not, how much is left? None ? ?Is this a 90 day supply: Y ? ?PHARMACY:  ?[place] [place] Phone:  [phone]  ?Fax:  [phone]  ?  ? ? ?Let patient know to contact pharmacy at the end of the day to make sure medication is ready. ? ?Please notify patient to allow 48-72 hours to process ?  ? ? ? ? ?

## 2021-11-16 NOTE — Telephone Encounter (Signed)
I have not taken new primary care patients in more than 10 years.  It is ok not to ask me and decline. ? ?If he really wants me to, then I can make an exception, but usually I do not. ?

## 2021-11-16 NOTE — Telephone Encounter (Signed)
Refill sent as requested. 

## 2021-11-16 NOTE — Telephone Encounter (Signed)
Patient states his son is now 59, and needs a PCP, wants to know if Dr Lorelei Pont would accept his son Scott [name] DOB: 12.3.2004 as a new patient ? ?Please advise ?

## 2022-01-03 DIAGNOSIS — M79641 Pain in right hand: Secondary | ICD-10-CM | POA: Diagnosis not present

## 2022-01-06 DIAGNOSIS — G5601 Carpal tunnel syndrome, right upper limb: Secondary | ICD-10-CM | POA: Diagnosis not present

## 2022-01-06 DIAGNOSIS — M65351 Trigger finger, right little finger: Secondary | ICD-10-CM | POA: Diagnosis not present

## 2022-01-06 DIAGNOSIS — M65332 Trigger finger, left middle finger: Secondary | ICD-10-CM | POA: Diagnosis not present

## 2022-01-16 ENCOUNTER — Other Ambulatory Visit: Payer: Self-pay | Admitting: Family Medicine

## 2022-01-17 ENCOUNTER — Other Ambulatory Visit: Payer: Self-pay | Admitting: Family Medicine

## 2022-01-18 NOTE — Telephone Encounter (Signed)
Last office visit 09/12/21 for Suspected Covid 19.  Last refilled:  Listed as historical medication.  CPE 03/22/22.  Ok to refill?

## 2022-02-14 DIAGNOSIS — M65352 Trigger finger, left little finger: Secondary | ICD-10-CM | POA: Diagnosis not present

## 2022-02-17 DIAGNOSIS — G5602 Carpal tunnel syndrome, left upper limb: Secondary | ICD-10-CM | POA: Diagnosis not present

## 2022-02-17 DIAGNOSIS — M65332 Trigger finger, left middle finger: Secondary | ICD-10-CM | POA: Diagnosis not present

## 2022-02-17 DIAGNOSIS — M65352 Trigger finger, left little finger: Secondary | ICD-10-CM | POA: Diagnosis not present

## 2022-03-07 ENCOUNTER — Other Ambulatory Visit: Payer: Self-pay | Admitting: Family Medicine

## 2022-03-07 DIAGNOSIS — Z79899 Other long term (current) drug therapy: Secondary | ICD-10-CM

## 2022-03-07 DIAGNOSIS — E785 Hyperlipidemia, unspecified: Secondary | ICD-10-CM

## 2022-03-07 DIAGNOSIS — C61 Malignant neoplasm of prostate: Secondary | ICD-10-CM

## 2022-03-07 DIAGNOSIS — R7309 Other abnormal glucose: Secondary | ICD-10-CM

## 2022-03-13 ENCOUNTER — Ambulatory Visit (INDEPENDENT_AMBULATORY_CARE_PROVIDER_SITE_OTHER): Payer: Medicare HMO

## 2022-03-13 VITALS — Ht 68.5 in | Wt 170.0 lb

## 2022-03-13 DIAGNOSIS — Z0000 Encounter for general adult medical examination without abnormal findings: Secondary | ICD-10-CM | POA: Diagnosis not present

## 2022-03-13 NOTE — Patient Instructions (Signed)
Scott [name] , Thank you for taking time to come for your Medicare Wellness Visit. I appreciate your ongoing commitment to your health goals. Please review the following plan we discussed and let me know if I can assist you in the future.   Screening recommendations/referrals: Colonoscopy: not required Recommended yearly ophthalmology/optometry visit for glaucoma screening and checkup Recommended yearly dental visit for hygiene and checkup  Vaccinations: Influenza vaccine: due 03/21/2022 Pneumococcal vaccine: completed 05/05/2015 Tdap vaccine: due Shingles vaccine: discussed   Covid-19:  10/06/2019, 09/05/2019  Advanced directives: Please bring a copy of your POA (Power of Attorney) and/or Living Will to your next appointment.   Conditions/risks identified: none  Next appointment: Follow up in one year for your annual wellness visit.   Preventive Care 79 Years and Older, Male Preventive care refers to lifestyle choices and visits with your health care provider that can promote health and wellness. What does preventive care include? A yearly physical exam. This is also called an annual well check. Dental exams once or twice a year. Routine eye exams. Ask your health care provider how often you should have your eyes checked. Personal lifestyle choices, including: Daily care of your teeth and gums. Regular physical activity. Eating a healthy diet. Avoiding tobacco and drug use. Limiting alcohol use. Practicing safe sex. Taking low doses of aspirin every day. Taking vitamin and mineral supplements as recommended by your health care provider. What happens during an annual well check? The services and screenings done by your health care provider during your annual well check will depend on your age, overall health, lifestyle risk factors, and family history of disease. Counseling  Your health care provider may ask you questions about your: Alcohol use. Tobacco use. Drug use. Emotional  well-being. Home and relationship well-being. Sexual activity. Eating habits. History of falls. Memory and ability to understand (cognition). Work and work Statistician. Screening  You may have the following tests or measurements: Height, weight, and BMI. Blood pressure. Lipid and cholesterol levels. These may be checked every 5 years, or more frequently if you are over 18 years old. Skin check. Lung cancer screening. You may have this screening every year starting at age 22 if you have a 30-pack-year history of smoking and currently smoke or have quit within the past 15 years. Fecal occult blood test (FOBT) of the stool. You may have this test every year starting at age 78. Flexible sigmoidoscopy or colonoscopy. You may have a sigmoidoscopy every 5 years or a colonoscopy every 10 years starting at age 66. Prostate cancer screening. Recommendations will vary depending on your family history and other risks. Hepatitis C blood test. Hepatitis B blood test. Sexually transmitted disease (STD) testing. Diabetes screening. This is done by checking your blood sugar (glucose) after you have not eaten for a while (fasting). You may have this done every 1-3 years. Abdominal aortic aneurysm (AAA) screening. You may need this if you are a current or former smoker. Osteoporosis. You may be screened starting at age 25 if you are at high risk. Talk with your health care provider about your test results, treatment options, and if necessary, the need for more tests. Vaccines  Your health care provider may recommend certain vaccines, such as: Influenza vaccine. This is recommended every year. Tetanus, diphtheria, and acellular pertussis (Tdap, Td) vaccine. You may need a Td booster every 10 years. Zoster vaccine. You may need this after age 43. Pneumococcal 13-valent conjugate (PCV13) vaccine. One dose is recommended after age 86. Pneumococcal  polysaccharide (PPSV23) vaccine. One dose is recommended after  age 40. Talk to your health care provider about which screenings and vaccines you need and how often you need them. This information is not intended to replace advice given to you by your health care provider. Make sure you discuss any questions you have with your health care provider. Document Released: 09/03/2015 Document Revised: 04/26/2016 Document Reviewed: 06/08/2015 Elsevier Interactive Patient Education  2017 [place] Prevention in the Home Falls can cause injuries. They can happen to people of all ages. There are many things you can do to make your home safe and to help prevent falls. What can I do on the outside of my home? Regularly fix the edges of walkways and driveways and fix any cracks. Remove anything that might make you trip as you walk through a door, such as a raised step or threshold. Trim any bushes or trees on the path to your home. Use bright outdoor lighting. Clear any walking paths of anything that might make someone trip, such as rocks or tools. Regularly check to see if handrails are loose or broken. Make sure that both sides of any steps have handrails. Any raised decks and porches should have guardrails on the edges. Have any leaves, snow, or ice cleared regularly. Use sand or salt on walking paths during winter. Clean up any spills in your garage right away. This includes oil or grease spills. What can I do in the bathroom? Use night lights. Install grab bars by the toilet and in the tub and shower. Do not use towel bars as grab bars. Use non-skid mats or decals in the tub or shower. If you need to sit down in the shower, use a plastic, non-slip stool. Keep the floor dry. Clean up any water that spills on the floor as soon as it happens. Remove soap buildup in the tub or shower regularly. Attach bath mats securely with double-sided non-slip rug tape. Do not have throw rugs and other things on the floor that can make you trip. What can I do in the  bedroom? Use night lights. Make sure that you have a light by your bed that is easy to reach. Do not use any sheets or blankets that are too big for your bed. They should not hang down onto the floor. Have a firm chair that has side arms. You can use this for support while you get dressed. Do not have throw rugs and other things on the floor that can make you trip. What can I do in the kitchen? Clean up any spills right away. Avoid walking on wet floors. Keep items that you use a lot in easy-to-reach places. If you need to reach something above you, use a strong step stool that has a grab bar. Keep electrical cords out of the way. Do not use floor polish or wax that makes floors slippery. If you must use wax, use non-skid floor wax. Do not have throw rugs and other things on the floor that can make you trip. What can I do with my stairs? Do not leave any items on the stairs. Make sure that there are handrails on both sides of the stairs and use them. Fix handrails that are broken or loose. Make sure that handrails are as long as the stairways. Check any carpeting to make sure that it is firmly attached to the stairs. Fix any carpet that is loose or worn. Avoid having throw rugs at the top  or bottom of the stairs. If you do have throw rugs, attach them to the floor with carpet tape. Make sure that you have a light switch at the top of the stairs and the bottom of the stairs. If you do not have them, ask someone to add them for you. What else can I do to help prevent falls? Wear shoes that: Do not have high heels. Have rubber bottoms. Are comfortable and fit you well. Are closed at the toe. Do not wear sandals. If you use a stepladder: Make sure that it is fully opened. Do not climb a closed stepladder. Make sure that both sides of the stepladder are locked into place. Ask someone to hold it for you, if possible. Clearly mark and make sure that you can see: Any grab bars or  handrails. First and last steps. Where the edge of each step is. Use tools that help you move around (mobility aids) if they are needed. These include: Canes. Walkers. Scooters. Crutches. Turn on the lights when you go into a dark area. Replace any light bulbs as soon as they burn out. Set up your furniture so you have a clear path. Avoid moving your furniture around. If any of your floors are uneven, fix them. If there are any pets around you, be aware of where they are. Review your medicines with your doctor. Some medicines can make you feel dizzy. This can increase your chance of falling. Ask your doctor what other things that you can do to help prevent falls. This information is not intended to replace advice given to you by your health care provider. Make sure you discuss any questions you have with your health care provider. Document Released: 06/03/2009 Document Revised: 01/13/2016 Document Reviewed: 09/11/2014 Elsevier Interactive Patient Education  2017 [employer].

## 2022-03-13 NOTE — Progress Notes (Signed)
I connected with [name] today by telephone and verified that I am speaking with the correct person using two identifiers. Location patient: home Location provider: work Persons participating in the virtual visit: [name], Glenna Durand LPN.   I discussed the limitations, risks, security and privacy concerns of performing an evaluation and management service by telephone and the availability of in person appointments. I also discussed with the patient that there may be a patient responsible charge related to this service. The patient expressed understanding and verbally consented to this telephonic visit.    Interactive audio and video telecommunications were attempted between this provider and patient, however failed, due to patient having technical difficulties OR patient did not have access to video capability.  We continued and completed visit with audio only.     Vital signs may be patient reported or missing.  Subjective:   Scott [name] is a 81 y.o. male who presents for Medicare Annual/Subsequent preventive examination.  Review of Systems     Cardiac Risk Factors include: advanced age (>27mn, >>80women);dyslipidemia;male gender     Objective:    Today's Vitals   03/13/22 0811  Weight: 170 lb (77.1 kg)  Height: 5' 8.5" (1.74 m)   Body mass index is 25.47 kg/m.     03/13/2022    8:16 AM 03/10/2021    2:08 PM 01/05/2020    9:01 AM 01/03/2020    4:23 PM 01/02/2020    8:07 PM 06/18/2019    6:21 PM 06/18/2019    6:21 AM  Advanced Directives  Does Patient Have a Medical Advance Directive? Yes No No No No Yes No  Type of [place]     Living will   Does patient want to make changes to medical advance directive?  No - Patient declined    No - Patient declined   Copy of HVenicein Chart? No - copy requested        Would patient like information on creating a medical advance directive?   No  - Patient declined No - Patient declined No - Patient declined No - Patient declined No - Patient declined    Current Medications (verified) Outpatient Encounter Medications as of 03/13/2022  Medication Sig   acetaminophen (TYLENOL) 325 MG tablet Take 2 tablets (650 mg total) by mouth every 6 (six) hours as needed for mild pain (or temp > 37.5 C (99.5 F)).   aspirin 81 MG tablet Take 1 tablet (81 mg total) by mouth daily. Take aspirin 81 mg once daily by mouth for 3 weeks(21 days) and then stop   atorvastatin (LIPITOR) 40 MG tablet TAKE 1 TABLET BY MOUTH DAILY AT 6PM   celecoxib (CELEBREX) 200 MG capsule TAKE 1 CAPSULE BY MOUTH TWICE DAILY.   cyanocobalamin 2000 MCG tablet Take 2,000 mcg by mouth daily.   pantoprazole (PROTONIX) 40 MG tablet TAKE 1 TABLET BY MOUTH ONCE DAILY   Red Yeast Rice Extract (RED YEAST RICE PO) Take 900 mg by mouth every morning.   vitamin E 180 MG (400 UNITS) capsule Take 400 Units by mouth daily.   traZODone (DESYREL) 50 MG tablet Take 1-2 tablets (50-100 mg total) by mouth at bedtime as needed for sleep. (Patient not taking: Reported on 03/13/2022)   No facility-administered encounter medications on file as of 03/13/2022.    Allergies (verified) Patient has no known allergies.   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  12/2019   ROTATOR CUFF REPAIR     right   SHOULDER ARTHROSCOPY Right 01/29/2013   Procedure: RIGHT ARTHROSCOPY SHOULDER WITH DEBRIDEMENT, POSSIBLE ROTATOR CUFF REPAIR;  Surgeon: Kerin Salen, MD;  Location: [facility];  Service: Orthopedics;  Laterality: Right;   TONSILLECTOMY     TOTAL HIP ARTHROPLASTY  2007   right    Family History  Problem Relation Age of Onset   Cancer Father        prostate   Cancer Brother        prostate   Cancer Brother        prostate   Social History   Socioeconomic History   Marital status: Married    Spouse name: Not on file   Number of children: Not on file   Years of education: Not on file   Highest education level: Not on file  Occupational History   Occupation: auctioneer    Employer: RETIRED  Tobacco Use   Smoking status: Never   Smokeless tobacco: Never  Vaping Use   Vaping Use: Never used  Substance and Sexual Activity   Alcohol use: No   Drug use: No   Sexual activity: Not on file  Other Topics Concern   Not on file  Social History Narrative   Former competitive bodybuilder, Geophysicist/field seismologist, all natural   [employer], [employer], [employer], multiple years   Social Determinants of Health   Financial Resource Strain: [place]  (03/13/2022)   Overall Financial Resource Strain (CARDIA)    Difficulty of Paying Living Expenses: Not hard at all  Food Insecurity: No [place] (03/13/2022)   Hunger Vital Sign    Worried About Running Out of Food in the Last Year: Never true    [place] in the Last Year: Never true  Transportation Needs: No Transportation Needs (03/13/2022)   PRAPARE - Hydrologist (Medical): No    Lack of Transportation (Non-Medical): No  Physical Activity: Sufficiently Active (03/13/2022)   Exercise Vital Sign    Days of Exercise per Week: 5 days     Minutes of Exercise per Session: 30 min  Stress: No Stress Concern Present (03/13/2022)   [place]    Feeling of Stress : Not at all  Social Connections: Not on file    Tobacco Counseling Counseling given: Not Answered   Clinical Intake:  Pre-visit preparation completed: Yes  Pain : No/denies pain     Nutritional Status: BMI 25 -29 Overweight Nutritional Risks: None Diabetes: No  How often do you need to have  someone help you when you read instructions, pamphlets, or other written materials from your doctor or pharmacy?: 1 - Never What is the last grade level you completed in school?: 7th grade  Diabetic? no  Interpreter Needed?: No  Information entered by :: NAllen LPN   Activities of Daily Living    03/13/2022    8:17 AM  In your present state of health, do you have any difficulty performing the following activities:  Hearing? 0  Vision? 0  Difficulty concentrating or making decisions? 0  Walking or climbing stairs? 0  Dressing or bathing? 0  Doing errands, shopping? 0  Preparing Food and eating ? N  Using the Toilet? N  In the past six months, have you accidently leaked urine? N  Do you have problems with loss of bowel control? N  Managing your Medications? N  Managing your Finances? N  Housekeeping or managing your Housekeeping? N    Patient Care Team: Owens Loffler, MD as PCP - General Wellington Hampshire, MD as PCP - Cardiology (Cardiology) Desiree Hane, MD as Attending Physician (Internal Medicine) Alla Feeling, [place] Team 7, MD as Rounding Team (Internal Medicine)  Indicate any recent Medical Services you may have received from other than [facility] providers in the past year (date may be approximate).     Assessment:   This is a routine wellness examination for Scott [name].  Hearing/Vision screen Vision Screening - Comments:: Regular eye exams,   Dietary issues and exercise activities  discussed: Current Exercise Habits: Home exercise routine, Type of exercise: strength training/weights, Time (Minutes): 30, Frequency (Times/Week): 5, Weekly Exercise (Minutes/Week): 150   Goals Addressed             This Visit's Progress    Patient Stated       03/13/2022, no goals       Depression Screen    03/13/2022    8:17 AM 03/10/2021    2:09 PM 10/17/2018    9:17 AM 09/10/2017    3:04 PM 08/10/2016   11:00 AM 05/05/2015    2:38 PM 11/26/2013    8:42 AM  PHQ 2/9 Scores  PHQ - 2 Score 0 0 0 2 2 0 0  PHQ- 9 Score  0  5 11      Fall Risk    03/13/2022    8:17 AM 03/10/2021    2:09 PM 10/17/2018    9:17 AM 09/10/2017    3:04 PM 08/10/2016   11:00 AM  Fall Risk   Falls in the past year? 0 0 0 No No  Number falls in past yr: 0 0 0    Injury with Fall? 0 0 0    Risk for fall due to : Medication side effect Medication side effect     Follow up Falls evaluation completed;Education provided;Falls prevention discussed Falls evaluation completed;Falls prevention discussed       FALL RISK PREVENTION PERTAINING TO THE HOME:  Any stairs in or around the home? Yes  If so, are there any without handrails? Yes  Home free of loose throw rugs in walkways, pet beds, electrical cords, etc? Yes  Adequate lighting in your home to reduce risk of falls? Yes   ASSISTIVE DEVICES UTILIZED TO PREVENT FALLS:  Life alert? No  Use of a cane, walker or w/c? No  Grab bars in the bathroom? No  Shower chair or bench in shower? No  Elevated toilet seat or a handicapped toilet? No   TIMED UP AND GO:  Was the test performed? No .      Cognitive Function:    03/10/2021    2:10 PM 09/10/2017    3:06 PM  MMSE - Mini Mental State Exam  Not completed: Unable to complete   Orientation to time  5  Orientation to Place  5  Registration  3  Attention/ Calculation  0  Recall  3  Language- name 2 objects  0  Language- repeat  1  Language- follow 3 step command  3  Language- read & follow  direction  0  Write a sentence  0  Copy design  0  Total score  20        03/13/2022    8:18 AM  6CIT Screen  What Year? 0 points  What month? 0 points  What time? 0 points  Count back from 20 0 points  Months in reverse 4 points  Repeat phrase 10 points  Total Score 14 points    Immunizations Immunization History  Administered Date(s) Administered   Influenza Split 06/13/2011   Influenza, High Dose Seasonal PF 05/09/2018, 04/30/2019, 05/26/2021   Influenza,inj,Quad PF,6+ Mos 05/05/2015   Influenza-Unspecified 06/01/2014, 06/21/2017, 06/04/2020   PFIZER(Purple Top)SARS-COV-2 Vaccination 09/05/2019, 10/06/2019   Pneumococcal Conjugate-13 11/26/2013   Pneumococcal Polysaccharide-23 05/05/2015    TDAP status: Due, Education has been provided regarding the importance of this vaccine. Advised may receive this vaccine at local pharmacy or Health Dept. Aware to provide a copy of the vaccination record if obtained from local pharmacy or Health Dept. Verbalized acceptance and understanding.  Flu Vaccine status: Up to date  Pneumococcal vaccine status: Up to date  Covid-19 vaccine status: Completed vaccines  Qualifies for Shingles Vaccine? Yes   Zostavax completed No   Shingrix Completed?: No.    Education has been provided regarding the importance of this vaccine. Patient has been advised to call insurance company to determine out of pocket expense if they have not yet received this vaccine. Advised may also receive vaccine at local pharmacy or Health Dept. Verbalized acceptance and understanding.  Screening Tests Health Maintenance  Topic Date Due   TETANUS/TDAP  Never done   Zoster Vaccines- Shingrix (1 of 2) Never done   COVID-19 Vaccine (3 - Pfizer risk series) 11/03/2019   COLONOSCOPY (Pts 45-29yr Insurance coverage will need to be confirmed)  02/18/2049 (Originally 02/19/2019)   INFLUENZA VACCINE  03/21/2022   Pneumonia Vaccine 79 Years old  Completed   Hepatitis C  Screening  Completed   HPV VACCINES  Aged Out    Health Maintenance  Health Maintenance Due  Topic Date Due   TETANUS/TDAP  Never done   Zoster Vaccines- Shingrix (1 of 2) Never done   COVID-19 Vaccine (3 - Pfizer risk series) 11/03/2019    Colorectal cancer screening: No longer required.   Lung Cancer Screening: (Low Dose CT Chest recommended if Age 79-80years, 30 pack-year currently smoking OR have quit w/in 15years.) does not qualify.   Lung Cancer Screening Referral: no  Additional Screening:  Hepatitis C Screening: does qualify; Completed 03/07/2021  Vision Screening: Recommended annual ophthalmology exams for early detection of glaucoma and other disorders of the eye. Is the patient up to date with their annual eye exam?  Yes  Who is the provider or what is the name of the office in which the patient attends annual eye exams? Can't remember name If pt is not established with a provider, would they like to be referred to a provider to  establish care? No .   Dental Screening: Recommended annual dental exams for proper oral hygiene  Community Resource Referral / Chronic Care Management: CRR required this visit?  No   CCM required this visit?  No      Plan:     I have personally reviewed and noted the following in the patient's chart:   Medical and social history Use of alcohol, tobacco or illicit drugs  Current medications and supplements including opioid prescriptions. Patient is not currently taking opioid prescriptions. Functional ability and status Nutritional status Physical activity Advanced directives List of other physicians Hospitalizations, surgeries, and ER visits in previous 12 months Vitals Screenings to include cognitive, depression, and falls Referrals and appointments  In addition, I have reviewed and discussed with patient certain preventive protocols, quality metrics, and best practice recommendations. A written personalized care plan for  preventive services as well as general preventive health recommendations were provided to patient.     Kellie Simmering, LPN   [id number]   Nurse Notes: none  Due to this being a virtual visit, the after visit summary with patients personalized plan was offered to patient via mail or my-chart.  to pick up at office at next visit

## 2022-03-15 ENCOUNTER — Other Ambulatory Visit (INDEPENDENT_AMBULATORY_CARE_PROVIDER_SITE_OTHER): Payer: Medicare HMO

## 2022-03-15 DIAGNOSIS — C61 Malignant neoplasm of prostate: Secondary | ICD-10-CM | POA: Diagnosis not present

## 2022-03-15 DIAGNOSIS — E785 Hyperlipidemia, unspecified: Secondary | ICD-10-CM | POA: Diagnosis not present

## 2022-03-15 DIAGNOSIS — Z79899 Other long term (current) drug therapy: Secondary | ICD-10-CM

## 2022-03-15 DIAGNOSIS — R7309 Other abnormal glucose: Secondary | ICD-10-CM | POA: Diagnosis not present

## 2022-03-15 LAB — CBC WITH DIFFERENTIAL/PLATELET
Basophils Absolute: 0 10*3/uL (ref 0.0–0.1)
Basophils Relative: 0.5 % (ref 0.0–3.0)
Eosinophils Absolute: 0.2 10*3/uL (ref 0.0–0.7)
Eosinophils Relative: 3.7 % (ref 0.0–5.0)
HCT: 44.1 % (ref 39.0–52.0)
Hemoglobin: 14.6 g/dL (ref 13.0–17.0)
Lymphocytes Relative: 23.4 % (ref 12.0–46.0)
Lymphs Abs: 1.1 10*3/uL (ref 0.7–4.0)
MCHC: 33.2 g/dL (ref 30.0–36.0)
MCV: 91.5 fl (ref 78.0–100.0)
Monocytes Absolute: 0.5 10*3/uL (ref 0.1–1.0)
Monocytes Relative: 11.4 % (ref 3.0–12.0)
Neutro Abs: 2.7 10*3/uL (ref 1.4–7.7)
Neutrophils Relative %: 61 % (ref 43.0–77.0)
Platelets: 183 10*3/uL (ref 150.0–400.0)
RBC: 4.81 Mil/uL (ref 4.22–5.81)
RDW: 14.1 % (ref 11.5–15.5)
WBC: 4.5 10*3/uL (ref 4.0–10.5)

## 2022-03-15 LAB — BASIC METABOLIC PANEL
BUN: 25 mg/dL — ABNORMAL HIGH (ref 6–23)
CO2: 27 mEq/L (ref 19–32)
Calcium: 9.2 mg/dL (ref 8.4–10.5)
Chloride: 107 mEq/L (ref 96–112)
Creatinine, Ser: 0.93 mg/dL (ref 0.40–1.50)
GFR: 78.37 mL/min (ref >60.00)
Glucose, Bld: 112 mg/dL — ABNORMAL HIGH (ref 70–99)
Potassium: 4.3 mEq/L (ref 3.5–5.1)
Sodium: 140 mEq/L (ref 135–145)

## 2022-03-15 LAB — HEPATIC FUNCTION PANEL
ALT: 21 U/L (ref 0–53)
AST: 20 U/L (ref 0–37)
Albumin: 4.3 g/dL (ref 3.5–5.2)
Alkaline Phosphatase: 67 U/L (ref 39–117)
Bilirubin, Direct: 0.2 mg/dL (ref 0.0–0.3)
Total Bilirubin: 0.8 mg/dL (ref 0.2–1.2)
Total Protein: 6.6 g/dL (ref 6.0–8.3)

## 2022-03-15 LAB — LIPID PANEL
Cholesterol: 103 mg/dL (ref 0–200)
HDL: 30.8 mg/dL — ABNORMAL LOW (ref >39.00)
LDL Cholesterol: 55 mg/dL (ref 0–99)
NonHDL: 71.95
Total CHOL/HDL Ratio: 3
Triglycerides: 84 mg/dL (ref 0.0–149.0)
VLDL: 16.8 mg/dL (ref 0.0–40.0)

## 2022-03-15 LAB — PSA, MEDICARE: PSA: 0 ng/ml — ABNORMAL LOW (ref 0.10–4.00)

## 2022-03-15 LAB — HEMOGLOBIN A1C: Hgb A1c MFr Bld: 6.4 % (ref 4.6–6.5)

## 2022-03-22 ENCOUNTER — Encounter: Payer: Self-pay | Admitting: Family Medicine

## 2022-03-22 ENCOUNTER — Ambulatory Visit (INDEPENDENT_AMBULATORY_CARE_PROVIDER_SITE_OTHER): Payer: Medicare HMO | Admitting: Family Medicine

## 2022-03-22 VITALS — BP 120/78 | HR 68 | Temp 97.8°F | Ht 68.0 in | Wt 171.4 lb

## 2022-03-22 DIAGNOSIS — Z0000 Encounter for general adult medical examination without abnormal findings: Secondary | ICD-10-CM

## 2022-03-22 DIAGNOSIS — R7303 Prediabetes: Secondary | ICD-10-CM | POA: Diagnosis not present

## 2022-03-22 DIAGNOSIS — Z1211 Encounter for screening for malignant neoplasm of colon: Secondary | ICD-10-CM | POA: Diagnosis not present

## 2022-03-22 HISTORY — DX: Prediabetes: R73.03

## 2022-03-22 MED ORDER — PANTOPRAZOLE SODIUM 40 MG PO TBEC: 40.0000 mg | DELAYED_RELEASE_TABLET | Freq: Two times a day (BID) | ORAL | 3 refills | Status: DC

## 2022-03-22 NOTE — Progress Notes (Signed)
Scott Fuhriman T. [name], MD, [place] at [facility] [place], [address]  Phone: [phone]  FAX: [phone]  Scott [name] - 81 y.o. male  MRN [id number]  Date of Birth: 1940/08/25  Date: 03/22/2022  PCP: Owens Loffler, MD  Referral: Owens Loffler, MD  Chief Complaint  Patient presents with   Annual Exam    Part 2   Patient Care Team: Owens Loffler, MD as PCP - General Wellington Hampshire, MD as PCP - Cardiology (Cardiology) Desiree Hane, MD as Attending Physician (Internal Medicine) Alla Feeling, [place] Team 7, MD as Rounding Team (Internal Medicine) Subjective:   Scott [name] is a 81 y.o. pleasant patient who presents with the following:  Preventative Health Maintenance Visit:  Health Maintenance Summary Reviewed and updated, unless pt declines services.  Tobacco History Reviewed. Alcohol: No concerns, no excessive use Exercise Habits: He is still working out about 30 minutes or more a day.  He is still doing plenty of push-ups.  Lifelong fitness. STD concerns: no risk or activity to increase risk Drug Use: None  Shingrix - does not want  Colon - 5 year recall Covid - bivalent booster - does not want a booster  Compliant with all of his medication.  Health Maintenance  Topic Date Due   TETANUS/TDAP  Never done   Zoster Vaccines- Shingrix (1 of 2) Never done   COVID-19 Vaccine (3 - Pfizer risk series) 11/03/2019   INFLUENZA VACCINE  03/21/2022   COLONOSCOPY (Pts 45-49yr Insurance coverage will need to be confirmed)  02/18/2049 (Originally 02/19/2019)   Pneumonia Vaccine 81 Years old  Completed   Hepatitis C Screening  Completed   HPV VACCINES  Aged Out   Immunization History  Administered Date(s) Administered   Influenza Split 06/13/2011   Influenza, High Dose Seasonal PF 05/09/2018, 04/30/2019, 05/26/2021   Influenza,inj,Quad PF,6+ Mos 05/05/2015   Influenza-Unspecified  06/01/2014, 06/21/2017, 06/04/2020   PFIZER(Purple Top)SARS-COV-2 Vaccination 09/05/2019, 10/06/2019   Pneumococcal Conjugate-13 11/26/2013   Pneumococcal Polysaccharide-23 05/05/2015   Patient Active Problem List   Diagnosis Date Noted   History of CVA (cerebrovascular accident) without residual deficits 01/03/2020    Priority: High   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 ([place] 06/23[place]    Priority: High   Prostate cancer, in remission     Priority: High   Prediabetes 03/22/2022    Priority: Medium    Right bundle branch block     Priority: Medium    AV BLOCK, 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    Priority: Medium    Hyperlipidemia LDL goal <100 04/28/2010    Priority: Medium    ALLERGIC RHINITIS 04/28/2010   GERD 04/28/2010    Past Medical History:  Diagnosis Date   Allergic rhinitis due to pollen    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 ([place] 06/23/2019   GERD (gastroesophageal reflux disease)    Hyperlipidemia    Prediabetes 03/22/2022   [place]state cancer ([place]    cancer   Right bundle branch block    RBBB    Past Surgical History:  Procedure Laterality Date   APPENDECTOMY     CARPAL TUNNEL RELEASE Bilateral    2023   HERNIA REPAIR  85,03   rt and lt ing hernia   KNEE ARTHROSCOPY  2012   left   LEFT HEART CATH AND CORONARY ANGIOGRAPHY N/A 05/30/2018   Procedure: LEFT HEART CATH AND CORONARY ANGIOGRAPHY;  Surgeon: AWellington Hampshire MD;  Location: [facility];  Service: Cardiovascular;  Laterality: N/A;   prostatectomy  [place]  12/2019   ROTATOR CUFF REPAIR     right   SHOULDER ARTHROSCOPY Right 01/29/2013   Procedure: RIGHT ARTHROSCOPY SHOULDER WITH DEBRIDEMENT, POSSIBLE ROTATOR CUFF REPAIR;  Surgeon: Kerin Salen, MD;  Location: [facility];  Service: Orthopedics;  Laterality: Right;   TONSILLECTOMY     TOTAL HIP ARTHROPLASTY  2007   right     Family History   Problem Relation Age of Onset   Cancer Father        prostate   Cancer Brother        prostate   Cancer Brother        prostate    Social History   Social History Narrative   Former competitive bodybuilder, Geophysicist/field seismologist, all natural   [employer], [place], [employer], multiple years    Past Medical History, Surgical History, Social History, Family History, Problem List, Medications, and Allergies have been reviewed and updated if relevant.  Review of Systems: Pertinent positives are listed above.  Otherwise, a full 14 point review of systems has been done in full and it is negative except where it is noted positive.  Objective:   BP 120/78   Pulse 68   Temp 97.8 F (36.6 C) (Oral)   Ht '5\' 8"'$  (1.727 m)   Wt 171 lb 6 oz (77.7 kg)   SpO2 98%   BMI 26.06 kg/m  Ideal Body Weight: Weight in (lb) to have BMI = 25: 164.1  Ideal Body Weight: Weight in (lb) to have BMI = 25: 164.1 No results found.    03/13/2022    8:17 AM 03/10/2021    2:09 PM 10/17/2018    9:17 AM 09/10/2017    3:04 PM 08/10/2016   11:00 AM  Depression screen PHQ 2/9  Decreased Interest 0 0 0 2 0  Down, Depressed, Hopeless 0 0 0 0 2  PHQ - 2 Score 0 0 0 2 2  Altered sleeping  0  1 3  Tired, decreased energy  0  1 3  Change in appetite  0  0 0  Feeling bad or failure about yourself   0  1 2  Trouble concentrating  0  0 1  Moving slowly or fidgety/restless  0  0 0  Suicidal thoughts  0  0 0  PHQ-9 Score  0  5 11  Difficult doing work/chores  Not difficult at all  Not difficult at all Not difficult at all     GEN: well developed, well nourished, no acute distress Eyes: conjunctiva and lids normal, PERRLA, EOMI ENT: TM clear, nares clear, oral exam WNL Neck: supple, no lymphadenopathy, no thyromegaly, no JVD Pulm: clear to auscultation and percussion, respiratory effort normal CV: regular rate and rhythm, S1-S2, no murmur, rub or gallop, no bruits, peripheral pulses normal and symmetric, no  cyanosis, clubbing, edema or varicosities GI: soft, non-tender; no hepatosplenomegaly, masses; active bowel sounds all quadrants GU: deferred Lymph: no cervical, axillary or inguinal adenopathy MSK: gait normal, muscle tone and strength WNL, no joint swelling, effusions, discoloration, crepitus  SKIN: clear, good turgor, color WNL, no rashes, lesions, or ulcerations Neuro: normal mental status, normal strength, sensation, and motion Psych: alert; oriented to person, place and time, normally interactive and not anxious or depressed in appearance.  All labs reviewed with patient. Results for orders placed or performed in  visit on 03/15/22  Lipid panel  Result Value Ref Range   Cholesterol 103 0 - 200 mg/dL   Triglycerides 84.0 0.0 - 149.0 mg/dL   HDL 30.80 (L) >39.00 mg/dL   VLDL 16.8 0.0 - 40.0 mg/dL   LDL Cholesterol 55 0 - 99 mg/dL   Total CHOL/HDL Ratio 3    NonHDL 71.95   Hepatic function panel  Result Value Ref Range   Total Bilirubin 0.8 0.2 - 1.2 mg/dL   Bilirubin, Direct 0.2 0.0 - 0.3 mg/dL   Alkaline Phosphatase 67 39 - 117 U/L   AST 20 0 - 37 U/L   ALT 21 0 - 53 U/L   Total Protein 6.6 6.0 - 8.3 g/dL   Albumin 4.3 3.5 - 5.2 g/dL  Basic metabolic panel  Result Value Ref Range   Sodium 140 135 - 145 mEq/L   Potassium 4.3 3.5 - 5.1 mEq/L   Chloride 107 96 - 112 mEq/L   CO2 27 19 - 32 mEq/L   Glucose, Bld 112 (H) 70 - 99 mg/dL   BUN 25 (H) 6 - 23 mg/dL   Creatinine, Ser 0.93 0.40 - 1.50 mg/dL   GFR 78.37 >60.00 mL/min   Calcium 9.2 8.4 - 10.5 mg/dL  CBC with Differential/Platelet  Result Value Ref Range   WBC 4.5 4.0 - 10.5 K/uL   RBC 4.81 4.22 - 5.81 Mil/uL   Hemoglobin 14.6 13.0 - 17.0 g/dL   HCT 44.1 39.0 - 52.0 %   MCV 91.5 78.0 - 100.0 fl   MCHC 33.2 30.0 - 36.0 g/dL   RDW 14.1 11.5 - 15.5 %   Platelets 183.0 150.0 - 400.0 K/uL   Neutrophils Relative % 61.0 43.0 - 77.0 %   Lymphocytes Relative 23.4 12.0 - 46.0 %   Monocytes Relative 11.4 3.0 - 12.0 %    Eosinophils Relative 3.7 0.0 - 5.0 %   Basophils Relative 0.5 0.0 - 3.0 %   Neutro Abs 2.7 1.4 - 7.7 K/uL   Lymphs Abs 1.1 0.7 - 4.0 K/uL   Monocytes Absolute 0.5 0.1 - 1.0 K/uL   Eosinophils Absolute 0.2 0.0 - 0.7 K/uL   Basophils Absolute 0.0 0.0 - 0.1 K/uL  Hemoglobin A1c  Result Value Ref Range   Hgb A1c MFr Bld 6.4 4.6 - 6.5 %  PSA, Medicare  Result Value Ref Range   PSA 0.00 (L) 0.10 - 4.00 ng/ml    Assessment and Plan:     ICD-10-CM   1. Healthcare maintenance  Z00.00     2. Screen for colon cancer  Z12.11 Ambulatory referral to Gastroenterology    3. Prediabetes  R73.03      Globally doing well.  His blood sugar is up, prediabetic range now.  He has been to work on his diet including ice cream and cookies.  Adenomatous polyps on his last colonoscopy, consult GI for repeat.  Health Maintenance Exam: The patient's preventative maintenance and recommended screening tests for an annual wellness exam were reviewed in full today. Brought up to date unless services declined.  Counselled on the importance of diet, exercise, and its role in overall health and mortality. The patient's FH and SH was reviewed, including their home life, tobacco status, and drug and alcohol status.  Follow-up in 1 year for physical exam or additional follow-up below.  Follow-up: No follow-ups on file. Or follow-up in 1 year if not noted.  Meds ordered this encounter  Medications   pantoprazole (PROTONIX) 40 MG  tablet    Sig: Take 1 tablet (40 mg total) by mouth 2 (two) times daily before a meal.    Dispense:  60 tablet    Refill:  3   Medications Discontinued During This Encounter  Medication Reason   traZODone (DESYREL) 50 MG tablet Ineffective   pantoprazole (PROTONIX) 40 MG tablet Reorder   Orders Placed This Encounter  Procedures   Ambulatory referral to Gastroenterology    Signed,  Frederico Hamman T. Saralyn Willison, MD   Allergies as of 03/22/2022   No Known Allergies      Medication  List        Accurate as of March 22, 2022 10:19 AM. If you have any questions, ask your nurse or doctor.          STOP taking these medications    traZODone 50 MG tablet Commonly known as: DESYREL Stopped by: Owens Loffler, MD       TAKE these medications    acetaminophen 325 MG tablet Commonly known as: TYLENOL Take 2 tablets (650 mg total) by mouth every 6 (six) hours as needed for mild pain (or temp > 37.5 C (99.5 F)).   aspirin 81 MG tablet Take 1 tablet (81 mg total) by mouth daily. Take aspirin 81 mg once daily by mouth for 3 weeks(21 days) and then stop   atorvastatin 40 MG tablet Commonly known as: LIPITOR TAKE 1 TABLET BY MOUTH DAILY AT 6PM   celecoxib 200 MG capsule Commonly known as: CELEBREX TAKE 1 CAPSULE BY MOUTH TWICE DAILY.   cyanocobalamin 2000 MCG tablet Take 2,000 mcg by mouth daily.   pantoprazole 40 MG tablet Commonly known as: PROTONIX Take 1 tablet (40 mg total) by mouth 2 (two) times daily before a meal. What changed: when to take this Changed by: Owens Loffler, MD   RED YEAST RICE PO Take 900 mg by mouth every morning.   vitamin E 180 MG (400 UNITS) capsule Take 400 Units by mouth daily.

## 2022-03-23 DIAGNOSIS — M19012 Primary osteoarthritis, left shoulder: Secondary | ICD-10-CM | POA: Diagnosis not present

## 2022-03-23 DIAGNOSIS — M19011 Primary osteoarthritis, right shoulder: Secondary | ICD-10-CM | POA: Diagnosis not present

## 2022-03-31 ENCOUNTER — Other Ambulatory Visit: Payer: Self-pay | Admitting: Family Medicine

## 2022-03-31 NOTE — Telephone Encounter (Signed)
LOV 03/22/22 CPE Last filled on 01/18/22 #60 with 2 refills No future appointments

## 2022-04-11 DIAGNOSIS — Z9889 Other specified postprocedural states: Secondary | ICD-10-CM | POA: Diagnosis not present

## 2022-04-11 DIAGNOSIS — M65332 Trigger finger, left middle finger: Secondary | ICD-10-CM | POA: Diagnosis not present

## 2022-04-17 ENCOUNTER — Other Ambulatory Visit: Payer: Self-pay | Admitting: Family Medicine

## 2022-05-30 DIAGNOSIS — M19012 Primary osteoarthritis, left shoulder: Secondary | ICD-10-CM | POA: Diagnosis not present

## 2022-05-30 DIAGNOSIS — M19011 Primary osteoarthritis, right shoulder: Secondary | ICD-10-CM | POA: Diagnosis not present

## 2022-06-21 DIAGNOSIS — K648 Other hemorrhoids: Secondary | ICD-10-CM | POA: Diagnosis not present

## 2022-06-21 DIAGNOSIS — D123 Benign neoplasm of transverse colon: Secondary | ICD-10-CM | POA: Diagnosis not present

## 2022-06-21 DIAGNOSIS — D128 Benign neoplasm of rectum: Secondary | ICD-10-CM | POA: Diagnosis not present

## 2022-06-21 DIAGNOSIS — D120 Benign neoplasm of cecum: Secondary | ICD-10-CM | POA: Diagnosis not present

## 2022-06-21 DIAGNOSIS — Z09 Encounter for follow-up examination after completed treatment for conditions other than malignant neoplasm: Secondary | ICD-10-CM | POA: Diagnosis not present

## 2022-06-21 DIAGNOSIS — Z86010 Personal history of colonic polyps: Secondary | ICD-10-CM | POA: Diagnosis not present

## 2022-06-21 DIAGNOSIS — K5730 Diverticulosis of large intestine without perforation or abscess without bleeding: Secondary | ICD-10-CM | POA: Diagnosis not present

## 2022-06-21 LAB — HM COLONOSCOPY

## 2022-07-04 ENCOUNTER — Encounter: Payer: Self-pay | Admitting: Family

## 2022-07-04 ENCOUNTER — Ambulatory Visit: Payer: Medicare HMO | Admitting: Family

## 2022-07-04 ENCOUNTER — Ambulatory Visit (INDEPENDENT_AMBULATORY_CARE_PROVIDER_SITE_OTHER): Payer: Medicare HMO | Admitting: Family

## 2022-07-04 VITALS — BP 134/70 | HR 70 | Temp 98.2°F | Resp 16 | Ht 68.0 in | Wt 161.2 lb

## 2022-07-04 DIAGNOSIS — B356 Tinea cruris: Secondary | ICD-10-CM | POA: Insufficient documentation

## 2022-07-04 MED ORDER — FLUCONAZOLE 150 MG PO TABS: ORAL_TABLET | ORAL | 0 refills | Status: DC

## 2022-07-04 MED ORDER — KETOCONAZOLE 2 % EX CREA: TOPICAL_CREAM | CUTANEOUS | 2 refills | Status: AC

## 2022-07-04 MED ORDER — KETOCONAZOLE 2 % EX CREA: TOPICAL_CREAM | CUTANEOUS | 0 refills | Status: DC

## 2022-07-04 NOTE — Assessment & Plan Note (Signed)
Suspected yeast in bil groin as well as bil axilla.  Rx diflucan 150 mg po qweek x three weeks Rx ketoconazole cream 2% cream to apply to areas  F/u with derm if no improvement Advised to change clothing if sweaty and or wet,and wear cotton underwear.

## 2022-07-04 NOTE — Progress Notes (Signed)
Established Patient Office Visit  Subjective:  Patient ID: Scott [name], male    DOB: [birth year]  Age: 81 y.o. MRN: [id number]  CC:  Chief Complaint  Patient presents with   Rash    In the groining area and going and moved up to the arm pits an to the back X 2 weeks but have gotten worse the last 2 days. Been using anti itch cream and no relief.    HPI Scott [name] is here today with concerns.   Arm with itchy rash that started about one month ago, has moved up into the arm pits onto the back. Using otc anti itch cream without relief. He has been changing clothing x 2-3 times a day without much relief.   Past Medical History:  Diagnosis Date   Allergic rhinitis due to pollen    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 ([place]) 06/23/2019   GERD (gastroesophageal reflux disease)    Hyperlipidemia    Prediabetes 03/22/2022   Prostate cancer ([place])    cancer   Right bundle branch block    RBBB    Past Surgical History:  Procedure Laterality Date   APPENDECTOMY     CARPAL TUNNEL RELEASE Bilateral    2023   HERNIA REPAIR  85,03   rt and lt ing hernia   KNEE ARTHROSCOPY  2012   left   LEFT HEART CATH AND CORONARY ANGIOGRAPHY N/A 05/30/2018   Procedure: LEFT HEART CATH AND CORONARY ANGIOGRAPHY;  Surgeon: Wellington Hampshire, MD;  Location: [facility];  Service: Cardiovascular;  Laterality: N/A;   prostatectomy  [place]  12/2019   ROTATOR CUFF REPAIR     right   SHOULDER ARTHROSCOPY Right 01/29/2013   Procedure: RIGHT ARTHROSCOPY SHOULDER WITH DEBRIDEMENT, POSSIBLE ROTATOR CUFF REPAIR;  Surgeon: Kerin Salen, MD;  Location: [facility];  Service: Orthopedics;  Laterality: Right;   TONSILLECTOMY     TOTAL HIP ARTHROPLASTY  2007   right     Family History  Problem Relation Age of Onset   Cancer Father        prostate   Cancer Brother        prostate   Cancer Brother        prostate     Social History   Socioeconomic History   Marital status: Married    Spouse name: Not on file   Number of children: Not on file   Years of education: Not on file   Highest education level: Not on file  Occupational History   Occupation: auctioneer    Employer: RETIRED  Tobacco Use   Smoking status: Never   Smokeless tobacco: Never  Vaping Use   Vaping Use: Never used  Substance and Sexual Activity   Alcohol use: No   Drug use: No   Sexual activity: Not on file  Other Topics Concern   Not on file  Social History Narrative   Former competitive bodybuilder, Geophysicist/field seismologist, all natural   [employer], [place], [employer], multiple years   Social Determinants of Health   Financial Resource Strain: [place]  (03/13/2022)   Overall Financial Resource Strain (CARDIA)    Difficulty of Paying Living Expenses: Not hard at all  Food Insecurity: No [place] (03/13/2022)   Hunger Vital Sign    Worried About Running Out of Food in the Last Year: Never true    [place] in  the Last Year: Never true  Transportation Needs: No Transportation Needs (03/13/2022)   PRAPARE - Hydrologist (Medical): No    Lack of Transportation (Non-Medical): No  Physical Activity: Sufficiently Active (03/13/2022)   Exercise Vital Sign    Days of Exercise per Week: 5 days    Minutes of Exercise per Session: 30 min  Stress: No Stress Concern Present (03/13/2022)   [place]    Feeling of Stress : Not at all  Social Connections: Not on file  Intimate Partner Violence: Not At Risk (03/10/2021)   Humiliation, Afraid, Rape, and Kick questionnaire    Fear of Current or Ex-Partner: No    Emotionally Abused: No    Physically Abused: No    Sexually Abused: No    Outpatient Medications Prior to Visit  Medication Sig Dispense Refill   acetaminophen (TYLENOL) 325 MG tablet Take 2 tablets (650 mg  total) by mouth every 6 (six) hours as needed for mild pain (or temp > 37.5 C (99.5 F)).     aspirin 81 MG tablet Take 1 tablet (81 mg total) by mouth daily. Take aspirin 81 mg once daily by mouth for 3 weeks(21 days) and then stop 21 tablet 0   atorvastatin (LIPITOR) 40 MG tablet TAKE 1 TABLET BY MOUTH DAILY AT 6PM 90 tablet 3   celecoxib (CELEBREX) 200 MG capsule TAKE 1 CAPSULE BY MOUTH TWICE DAILY. 60 capsule 2   cyanocobalamin 2000 MCG tablet Take 2,000 mcg by mouth daily.     pantoprazole (PROTONIX) 40 MG tablet Take 1 tablet (40 mg total) by mouth 2 (two) times daily before a meal. 60 tablet 3   Red Yeast Rice Extract (RED YEAST RICE PO) Take 900 mg by mouth every morning.     vitamin E 180 MG (400 UNITS) capsule Take 400 Units by mouth daily.     No facility-administered medications prior to visit.    No Known Allergies      Objective:    Physical Exam Skin:    Comments: Papular red lesions bil anxillary areas as well as within inguinal groin folds with satelite lesions     BP 134/70   Pulse 70   Temp 98.2 F (36.8 C)   Resp 16   Ht '5\' 8"'$  (1.727 m)   Wt 161 lb 4 oz (73.1 kg)   SpO2 98%   BMI 24.52 kg/m  Wt Readings from Last 3 Encounters:  07/04/22 161 lb 4 oz (73.1 kg)  03/22/22 171 lb 6 oz (77.7 kg)  03/13/22 170 lb (77.1 kg)     Health Maintenance Due  Topic Date Due   TETANUS/TDAP  Never done   Zoster Vaccines- Shingrix (1 of 2) Never done   COVID-19 Vaccine (3 - Pfizer risk series) 11/03/2019    There are no preventive care reminders to display for this patient.  No results found for: "TSH" Lab Results  Component Value Date   WBC 4.5 03/15/2022   HGB 14.6 03/15/2022   HCT 44.1 03/15/2022   MCV 91.5 03/15/2022   PLT 183.0 03/15/2022   Lab Results  Component Value Date   NA 140 03/15/2022   K 4.3 03/15/2022   CO2 27 03/15/2022   GLUCOSE 112 (H) 03/15/2022   BUN 25 (H) 03/15/2022   CREATININE 0.93 03/15/2022   BILITOT 0.8 03/15/2022    ALKPHOS 67 03/15/2022   AST 20 03/15/2022   ALT 21  03/15/2022   PROT 6.6 03/15/2022   ALBUMIN 4.3 03/15/2022   CALCIUM 9.2 03/15/2022   ANIONGAP 5 01/06/2020   GFR 78.37 03/15/2022   Lab Results  Component Value Date   HGBA1C 6.4 03/15/2022      Assessment & Plan:   Problem List Items Addressed This Visit       Musculoskeletal and Integument   Tinea cruris - Primary    Suspected yeast in bil groin as well as bil axilla.  Rx diflucan 150 mg po qweek x three weeks Rx ketoconazole cream 2% cream to apply to areas  F/u with derm if no improvement Advised to change clothing if sweaty and or wet,and wear cotton underwear.       Relevant Medications   fluconazole (DIFLUCAN) 150 MG tablet   ketoconazole (NIZORAL) 2 % cream    Meds ordered this encounter  Medications   fluconazole (DIFLUCAN) 150 MG tablet    Sig: Take one pill po qweek for four weeks    Dispense:  4 tablet    Refill:  0    Order Specific Question:   Supervising Provider    Answer:   BEDSOLE, AMY E [[id number]]   DISCONTD: ketoconazole (NIZORAL) 2 % cream    Sig: Apply one application twice daily for two weeks    Dispense:  15 g    Refill:  0    Order Specific Question:   Supervising Provider    Answer:   BEDSOLE, AMY E [[id number]]   ketoconazole (NIZORAL) 2 % cream    Sig: Apply one application twice daily for two weeks    Dispense:  15 g    Refill:  2    Order Specific Question:   Supervising Provider    Answer:   BEDSOLE, AMY E [[id number]]    Follow-up: No follow-ups on file.    Eugenia Pancoast, FNP

## 2022-07-04 NOTE — Patient Instructions (Signed)
  Use cream to areas and take pill once a week for four weeks.    Regards,   Eugenia Pancoast FNP-C

## 2022-07-07 ENCOUNTER — Encounter: Payer: Self-pay | Admitting: Family Medicine

## 2022-07-10 DIAGNOSIS — Z85828 Personal history of other malignant neoplasm of skin: Secondary | ICD-10-CM | POA: Diagnosis not present

## 2022-07-10 DIAGNOSIS — L304 Erythema intertrigo: Secondary | ICD-10-CM | POA: Diagnosis not present

## 2022-07-10 DIAGNOSIS — L245 Irritant contact dermatitis due to other chemical products: Secondary | ICD-10-CM | POA: Diagnosis not present

## 2022-07-11 DIAGNOSIS — M19131 Post-traumatic osteoarthritis, right wrist: Secondary | ICD-10-CM | POA: Diagnosis not present

## 2022-07-22 ENCOUNTER — Other Ambulatory Visit: Payer: Self-pay | Admitting: Family Medicine

## 2022-07-26 ENCOUNTER — Other Ambulatory Visit: Payer: Self-pay | Admitting: Family Medicine

## 2022-08-07 NOTE — Telephone Encounter (Signed)
Last office visit 07/04/22 with Dugal for Tinea Cruris.  Last refilled 03/31/22 for #60 with 2 refills. No future appointments.

## 2022-08-29 DIAGNOSIS — Z85828 Personal history of other malignant neoplasm of skin: Secondary | ICD-10-CM | POA: Diagnosis not present

## 2022-08-29 DIAGNOSIS — L308 Other specified dermatitis: Secondary | ICD-10-CM | POA: Diagnosis not present

## 2022-09-04 DIAGNOSIS — H04123 Dry eye syndrome of bilateral lacrimal glands: Secondary | ICD-10-CM | POA: Diagnosis not present

## 2022-09-04 DIAGNOSIS — H40013 Open angle with borderline findings, low risk, bilateral: Secondary | ICD-10-CM | POA: Diagnosis not present

## 2022-09-04 DIAGNOSIS — H26493 Other secondary cataract, bilateral: Secondary | ICD-10-CM | POA: Diagnosis not present

## 2022-09-04 DIAGNOSIS — Z961 Presence of intraocular lens: Secondary | ICD-10-CM | POA: Diagnosis not present

## 2022-09-12 DIAGNOSIS — M720 Palmar fascial fibromatosis [Dupuytren]: Secondary | ICD-10-CM | POA: Diagnosis not present

## 2022-09-12 DIAGNOSIS — M65341 Trigger finger, right ring finger: Secondary | ICD-10-CM | POA: Diagnosis not present

## 2022-09-13 ENCOUNTER — Ambulatory Visit (INDEPENDENT_AMBULATORY_CARE_PROVIDER_SITE_OTHER): Payer: Medicare HMO | Admitting: Family Medicine

## 2022-09-13 ENCOUNTER — Encounter: Payer: Self-pay | Admitting: Family Medicine

## 2022-09-13 VITALS — BP 122/70 | HR 82 | Temp 98.0°F | Ht 68.0 in | Wt 171.4 lb

## 2022-09-13 DIAGNOSIS — L309 Dermatitis, unspecified: Secondary | ICD-10-CM | POA: Diagnosis not present

## 2022-09-13 MED ORDER — TRIAMCINOLONE ACETONIDE 0.1 % EX CREA: 1.0000 | TOPICAL_CREAM | Freq: Two times a day (BID) | CUTANEOUS | 1 refills | Status: DC

## 2022-09-13 MED ORDER — DEXAMETHASONE SODIUM PHOSPHATE 100 MG/10ML IJ SOLN
10.0000 mg | Freq: Once | INTRAMUSCULAR | Status: AC
  Administered 2022-09-13: 10 mg via INTRAMUSCULAR

## 2022-09-13 NOTE — Progress Notes (Signed)
Lavena Loretto T. Zeenat Jeanbaptiste, MD, [place] at [facility] [place], [address]  Phone: [phone]  FAX: [phone]  Scott [name] - 82 y.o. male  MRN [id number]  Date of Birth: February 09, 1941  Date: 09/13/2022  PCP: Owens Loffler, MD  Referral: Owens Loffler, MD  Chief Complaint  Patient presents with   Rash    Itchy Back x 3 weeks   Subjective:   Scott [name] is a 82 y.o. very pleasant male patient with Body mass index is 26.06 kg/m. who presents with the following:  Rash on back -he has developed a highly pruritic rash on his back.  He did have some small amount of Lidex cream that he tried on this, without much significant success.  He does not typically get rashes, he has no history of eczema, psoriasis or any other chronic skin condition.  He does not know of any particular exposure, new topical solutions, soaps, detergents or anything similar to this.  Review of Systems is noted in the HPI, as appropriate  Objective:   BP 122/70   Pulse 82   Temp 98 F (36.7 C) (Oral)   Ht '5\' 8"'$  (1.727 m)   Wt 171 lb 6 oz (77.7 kg)   SpO2 95%   BMI 26.06 kg/m   GEN: No acute distress; alert,appropriate. PULM: Breathing comfortably in no respiratory distress PSYCH: Normally interactive.     Evidence of excoriation  There is also a central cyst  Laboratory and Imaging Data:  Assessment and Plan:     ICD-10-CM   1. Dermatitis  L30.9 dexamethasone (DECADRON) injection 10 mg     Will give him a shot of Decadron today in the office and give him some Kenalog to use at home.  Medication Management during today's office visit: Meds ordered this encounter  Medications   triamcinolone cream (KENALOG) 0.1 %    Sig: Apply 1 Application topically 2 (two) times daily.    Dispense:  454 g    Refill:  1   dexamethasone (DECADRON) injection 10 mg   There are no discontinued medications.  Orders placed today  for conditions managed today: No orders of the defined types were placed in this encounter.   Disposition: No follow-ups on file.  Dragon [employer] One speech-to-text software was used for transcription in this dictation.  Possible transcriptional errors can occur using Editor, commissioning.   Signed,  Maud Deed. Kashawn Dirr, MD   Outpatient Encounter Medications as of 09/13/2022  Medication Sig   acetaminophen (TYLENOL) 325 MG tablet Take 2 tablets (650 mg total) by mouth every 6 (six) hours as needed for mild pain (or temp > 37.5 C (99.5 F)).   aspirin 81 MG tablet Take 1 tablet (81 mg total) by mouth daily. Take aspirin 81 mg once daily by mouth for 3 weeks(21 days) and then stop   atorvastatin (LIPITOR) 40 MG tablet TAKE 1 TABLET BY MOUTH DAILY AT 6PM   celecoxib (CELEBREX) 200 MG capsule TAKE 1 CAPSULE BY MOUTH TWICE DAILY.   cyanocobalamin 2000 MCG tablet Take 2,000 mcg by mouth daily.   fluconazole (DIFLUCAN) 150 MG tablet Take one pill po qweek for four weeks   fluocinonide cream (LIDEX) 1.66 % Apply 1 Application topically 2 (two) times daily.   ketoconazole (NIZORAL) 2 % cream Apply one application twice daily for two weeks   pantoprazole (PROTONIX) 40 MG tablet TAKE ONE TABLET BY MOUTH TWICE DAILY BEFORE A MEAL  Red Yeast Rice Extract (RED YEAST RICE PO) Take 900 mg by mouth every morning.   triamcinolone cream (KENALOG) 0.1 % Apply 1 Application topically 2 (two) times daily.   vitamin E 180 MG (400 UNITS) capsule Take 400 Units by mouth daily.   [EXPIRED] dexamethasone (DECADRON) injection 10 mg    No facility-administered encounter medications on file as of 09/13/2022.

## 2022-10-05 DIAGNOSIS — Z85828 Personal history of other malignant neoplasm of skin: Secondary | ICD-10-CM | POA: Diagnosis not present

## 2022-10-05 DIAGNOSIS — L821 Other seborrheic keratosis: Secondary | ICD-10-CM | POA: Diagnosis not present

## 2022-10-05 DIAGNOSIS — L111 Transient acantholytic dermatosis [Grover]: Secondary | ICD-10-CM | POA: Diagnosis not present

## 2022-10-10 ENCOUNTER — Other Ambulatory Visit: Payer: Self-pay | Admitting: Family Medicine

## 2022-10-10 NOTE — Telephone Encounter (Signed)
Last office visit 09/13/22 for dermatitis.  Last refilled 09/13/22 for 454 g with 1 refill.  Pharmacy is asking for next refill.  Next Appt: No future appointments.

## 2022-11-13 ENCOUNTER — Emergency Department: Payer: Medicare HMO

## 2022-11-13 ENCOUNTER — Emergency Department
Admission: EM | Admit: 2022-11-13 | Discharge: 2022-11-13 | Disposition: A | Discharge status: Home / Self Care | Payer: Medicare HMO | Attending: Emergency Medicine | Admitting: Emergency Medicine

## 2022-11-13 ENCOUNTER — Telehealth: Payer: Self-pay | Admitting: Family Medicine

## 2022-11-13 DIAGNOSIS — R42 Dizziness and giddiness: Secondary | ICD-10-CM | POA: Diagnosis not present

## 2022-11-13 DIAGNOSIS — R2681 Unsteadiness on feet: Secondary | ICD-10-CM | POA: Diagnosis not present

## 2022-11-13 DIAGNOSIS — R110 Nausea: Secondary | ICD-10-CM | POA: Diagnosis not present

## 2022-11-13 DIAGNOSIS — E860 Dehydration: Secondary | ICD-10-CM

## 2022-11-13 DIAGNOSIS — R262 Difficulty in walking, not elsewhere classified: Secondary | ICD-10-CM | POA: Insufficient documentation

## 2022-11-13 DIAGNOSIS — I951 Orthostatic hypotension: Secondary | ICD-10-CM | POA: Diagnosis not present

## 2022-11-13 DIAGNOSIS — I6381 Other cerebral infarction due to occlusion or stenosis of small artery: Secondary | ICD-10-CM | POA: Diagnosis not present

## 2022-11-13 DIAGNOSIS — H532 Diplopia: Secondary | ICD-10-CM | POA: Diagnosis not present

## 2022-11-13 DIAGNOSIS — R519 Headache, unspecified: Secondary | ICD-10-CM | POA: Diagnosis not present

## 2022-11-13 LAB — URINALYSIS, ROUTINE W REFLEX MICROSCOPIC
Bilirubin Urine: NEGATIVE
Glucose, UA: NEGATIVE mg/dL
Hgb urine dipstick: NEGATIVE
Ketones, ur: NEGATIVE mg/dL
Leukocytes,Ua: NEGATIVE
Nitrite: NEGATIVE
Protein, ur: NEGATIVE mg/dL
Specific Gravity, Urine: 1.012 (ref 1.005–1.030)
Squamous Epithelial / HPF: NONE SEEN /HPF (ref 0–5)
pH: 7 (ref 5.0–8.0)

## 2022-11-13 LAB — DIFFERENTIAL
Abs Immature Granulocytes: 0.02 10*3/uL (ref 0.00–0.07)
Basophils Absolute: 0 10*3/uL (ref 0.0–0.1)
Basophils Relative: 0 %
Eosinophils Absolute: 0.1 10*3/uL (ref 0.0–0.5)
Eosinophils Relative: 1 %
Immature Granulocytes: 0 %
Lymphocytes Relative: 11 %
Lymphs Abs: 0.8 10*3/uL (ref 0.7–4.0)
Monocytes Absolute: 0.4 10*3/uL (ref 0.1–1.0)
Monocytes Relative: 6 %
Neutro Abs: 5.9 10*3/uL (ref 1.7–7.7)
Neutrophils Relative %: 82 %

## 2022-11-13 LAB — COMPREHENSIVE METABOLIC PANEL
ALT: 25 U/L (ref 0–44)
AST: 25 U/L (ref 15–41)
Albumin: 3.9 g/dL (ref 3.5–5.0)
Alkaline Phosphatase: 61 U/L (ref 38–126)
Anion gap: 8 (ref 5–15)
BUN: 20 mg/dL (ref 8–23)
CO2: 23 mmol/L (ref 22–32)
Calcium: 8.8 mg/dL — ABNORMAL LOW (ref 8.9–10.3)
Chloride: 108 mmol/L (ref 98–111)
Creatinine, Ser: 0.79 mg/dL (ref 0.61–1.24)
GFR, Estimated: >60 mL/min (ref >60)
Glucose, Bld: 118 mg/dL — ABNORMAL HIGH (ref 70–99)
Potassium: 3.9 mmol/L (ref 3.5–5.1)
Sodium: 139 mmol/L (ref 135–145)
Total Bilirubin: 0.7 mg/dL (ref 0.3–1.2)
Total Protein: 6.5 g/dL (ref 6.5–8.1)

## 2022-11-13 LAB — APTT: aPTT: 29 seconds (ref 24–36)

## 2022-11-13 LAB — CBC
HCT: 46.6 % (ref 39.0–52.0)
Hemoglobin: 15.3 g/dL (ref 13.0–17.0)
MCH: 30.2 pg (ref 26.0–34.0)
MCHC: 32.8 g/dL (ref 30.0–36.0)
MCV: 91.9 fL (ref 80.0–100.0)
Platelets: 188 10*3/uL (ref 150–400)
RBC: 5.07 MIL/uL (ref 4.22–5.81)
RDW: 13.7 % (ref 11.5–15.5)
WBC: 7.2 10*3/uL (ref 4.0–10.5)
nRBC: 0 % (ref 0.0–0.2)

## 2022-11-13 LAB — PROTIME-INR
INR: 1 (ref 0.8–1.2)
Prothrombin Time: 13.5 seconds (ref 11.4–15.2)

## 2022-11-13 LAB — ETHANOL: Alcohol, Ethyl (B): <10 mg/dL (ref <10)

## 2022-11-13 LAB — CBG MONITORING, ED: Glucose-Capillary: 113 mg/dL — ABNORMAL HIGH (ref 70–99)

## 2022-11-13 LAB — TROPONIN I (HIGH SENSITIVITY)
Troponin I (High Sensitivity): 4 ng/L (ref <18)
Troponin I (High Sensitivity): 5 ng/L (ref <18)

## 2022-11-13 MED ORDER — ONDANSETRON 4 MG PO TBDP: 4.0000 mg | ORAL_TABLET | Freq: Three times a day (TID) | ORAL | 0 refills | Status: AC | PRN

## 2022-11-13 MED ORDER — SODIUM CHLORIDE 0.9 % IV BOLUS
500.0000 mL | Freq: Once | INTRAVENOUS | Status: AC
  Administered 2022-11-13: 500 mL via INTRAVENOUS

## 2022-11-13 MED ORDER — SODIUM CHLORIDE 0.9% FLUSH: 3.0000 mL | Freq: Once | INTRAVENOUS | Status: DC

## 2022-11-13 MED ORDER — LORAZEPAM 2 MG/ML IJ SOLN
0.5000 mg | Freq: Once | INTRAMUSCULAR | Status: AC
  Administered 2022-11-13: 0.5 mg via INTRAVENOUS
  Filled 2022-11-13: qty 1

## 2022-11-13 NOTE — Progress Notes (Signed)
Telestroke RN - code stroke note  1232- stroke cart activation- Pt is already in CT. Dr Quinn Axe at bedside. LKW 2300 dizziness and blurry vision when he woke up this AM 1242- no need for CTA, not a TNK or IR candidate at this time per Dr Quinn Axe. End code stroke.   MRS 0 NIH 1

## 2022-11-13 NOTE — Progress Notes (Signed)
Chap responded to Code stroke. Pt grandson in the room awaiting Pt who is in CT Scan. Introduced Chaplain services to the family as part of their care team while here. Family warmly appreciate the visit.   11/13/22 1300  Spiritual Encounters  Type of Visit Initial  Care provided to: Family  Referral source Nurse (RN/NT/LPN)  Reason for visit Code  OnCall Visit No  Interventions  Spiritual Care Interventions Made Compassionate presence;Established relationship of care and support  Intervention Outcomes  Outcomes Connection to spiritual care;Awareness of support  Spiritual Care Plan  Spiritual Care Issues Still Outstanding No further spiritual care needs at this time (see row info)

## 2022-11-13 NOTE — Telephone Encounter (Signed)
[place] Day - Client TELEPHONE ADVICE RECORD [facility] Patient Name: Scott [name] Gender: Male DOB: 07/05/1941 Age: 82 Y 102 M 3 D Return Phone Number: [phone] (Primary) Address: [place]  [address] Client [facility] - Client Client Site [place] - Day Provider [place], Frederico Hamman - MD Contact Type Call Who Is Calling Patient / Member / Family / Caregiver Call Type Triage / Clinical Relationship To Patient Self Return Phone Number [phone] (Primary) Chief Complaint Dizziness Reason for Call Symptomatic / Request for Health Information Initial Comment Caller states Claiborne Billings with [facility]. The pt states since he woke up this morning, he has been really dizzy and having a hard time walking. He is also nausea. Translation No Nurse Assessment Nurse: Jearld Pies, RN, Lovena Le Date/Time Eilene Ghazi Time): 11/13/2022 11:29:03 AM Confirm and document reason for call. If symptomatic, describe symptoms. ---Pt is having dizziness, nausea, and headache. Denies fever, SOB, chest pain, or any other symptoms at this time. Does the patient have any new or worsening symptoms? ---Yes Will a triage be completed? ---Yes Related visit to physician within the last 2 weeks? ---No Does the PT have any chronic conditions? (i.e. diabetes, asthma, this includes High risk factors for pregnancy, etc.) ---Yes List chronic conditions. ---Acid Reflux Is this a behavioral health or substance abuse call? ---No Guidelines Guideline Title Affirmed Question Affirmed Notes Nurse Date/Time (Eastern Time) Dizziness - Vertigo [1] Dizziness (vertigo) present now AND [2] one or more STROKE RISK FACTORS (i.e., hypertension, diabetes, prior stroke/ TIA, heart attack) (Exception: Prior Jake Bathe 11/13/2022 11:29:45 AM PLEASE NOTE: All timestamps contained within this report are represented as  [place] Standard Time. CONFIDENTIALTY NOTICE: This fax transmission is intended only for the addressee. It contains information that is legally privileged, confidential or otherwise protected from use or disclosure. If you are not the intended recipient, you are strictly prohibited from reviewing, disclosing, copying using or disseminating any of this information or taking any action in reliance on or regarding this information. If you have received this fax in error, please notify us immediately by telephone so that we can arrange for its return to [place]. Phone: [phone], Toll-Free: [phone], Fax: [phone] Page: 2 of 2 Call Id: [id number] Guidelines Guideline Title Affirmed Question Affirmed Notes Nurse Date/Time Eilene Ghazi Time) doctor or NP/PA evaluation for this AND no different/ worse than usual.) Disp. Time Eilene Ghazi Time) Disposition Final User 11/13/2022 11:38:20 AM Go to ED Now (or PCP triage) Yes Jearld Pies, RN, Lovena Le Final Disposition 11/13/2022 11:38:20 AM Go to ED Now (or PCP triage) Yes Jearld Pies, RN, Apolonio Schneiders Disagree/Comply Comply Caller Understands Yes PreDisposition Did not know what to do Care Advice Given Per Guideline GO TO ED NOW (OR PCP TRIAGE): * IF NO PCP (PRIMARY CARE PROVIDER) SECOND-LEVEL TRIAGE: You need to be seen within the next hour. Go to the [facility] at _____________ [place] as soon as you can. Comments User: Malissa Hippo, RN Date/Time Eilene Ghazi Time): 11/13/2022 11:39:03 AM Max hold time on back line. Pt states no appt until tomorrow. Pt refereed to ED. Referrals [facility]

## 2022-11-13 NOTE — ED Notes (Signed)
Pt back from MRI and place back on monitoring system

## 2022-11-13 NOTE — ED Notes (Signed)
Code  stroke  called  to  [facility] 

## 2022-11-13 NOTE — ED Notes (Signed)
Pt reports woke up at 0400 and felt normal and then woke up this morning with blurry vision, dizziness and generalized weakness.

## 2022-11-13 NOTE — Discharge Instructions (Addendum)
Your workup was reassuring there is no evidence of stroke based upon , MRI.  You are seen by neurology and cleared to go home.  If you develop return or severe symptoms or any other concerns return to the ER.  Try to stay well-hydrated.  Follow-up with your primary care doctor.  Try to make slow position changes.

## 2022-11-13 NOTE — Telephone Encounter (Signed)
Prior stroke - glad he went to the ER.

## 2022-11-13 NOTE — Code Documentation (Signed)
Stroke Response Nurse Documentation Code Documentation  Scott [name] is a 82 y.o. male arriving to [facility] via [employer] on 11/10/2022 with past medical hx of GERD, HLD, prostate cancer, right bundle branch block, right basal ganglia infarct, prediabetes. On No antithrombotic. Code stroke was activated by ED.   Patient from home where he was LKW at 2300 3/24 and now complaining of dizziness. Patient reports that he was last at his baseline before going to bed last night between 2300-0000, and when he woke up at 0400 to use the bathroom. Patient went back to sleep and when he woke up at 0600 he was dizzy and had blurry vision which did not improve. Patient reports dizziness is worse while lying down.    Stroke team meets patient in CT after code stroke activation. Patient cleared for CT by Dr. Jari Pigg. Patient to CT with team. NIHSS 1, see documentation for details and code stroke times. Patient with disoriented on exam. The following imaging was completed:  CT Head. Patient is not a candidate for IV Thrombolytic due to outside window, per MD. Patient is not a candidate for IR due to LVO not suspected, per MD.   Care Plan: q 2 hr NIHSS & vital signs, swallow screen per protocol.   Bedside handoff with ED RN Claiborne Billings.    Charise Carwin  Stroke Response RN

## 2022-11-13 NOTE — Telephone Encounter (Signed)
Per chart review tab pt is already at [facility] ED. Sending note to Dr Lorelei Pont who is out of office, [place] pool and Dr Meriel Flavors who is in Garment/textile technologist as Juluis Rainier.

## 2022-11-13 NOTE — ED Notes (Signed)
MD called in regard to pt needing ativan prior to MRI

## 2022-11-13 NOTE — ED Triage Notes (Addendum)
Pt sts that he woke up with dizziness this AM. Pt sts that he went to bed at 0001 this AM and woke up at 0400 to go to the bathroom and he was fine. Pt sts that he has been seeing double vision and unable to walk due to the dizziness since he woke up at 0600. Pt has equal grips and strength in all four ext. Pt has no facial drop and does not have any slurring of speech.

## 2022-11-13 NOTE — ED Notes (Signed)
Pt transported to MRI 

## 2022-11-13 NOTE — ED Provider Notes (Signed)
Emergency department handoff note  Care of this patient was signed out to me at the end of the previous provider shift.  All pertinent patient information was conveyed and all questions were answered.  Patient pending reevaluation after IV fluids and patient is having minimal dizziness with ambulation at this point.  Patient's blood pressure significantly improved after IV hydration.  Patient encouraged to continue rehydration at home patient agrees with plan for discharge as well as PCP follow-up.  The patient has been reexamined and is ready to be discharged.  All diagnostic results have been reviewed and discussed with the patient/family.  Care plan has been outlined and the patient/family understands all current diagnoses, results, and treatment plans.  There are no new complaints, changes, or physical findings at this time.  All questions have been addressed and answered.  Patient was instructed to, and agrees to follow-up with their primary care physician as well as return to the emergency department if any new or worsening symptoms develop.   Naaman Plummer, MD 11/13/22 [phone]

## 2022-11-13 NOTE — Consult Note (Signed)
NEUROLOGY CONSULTATION NOTE   Date of service: November 13, 2022 Patient Name: Scott [name] MRN:  [id number] DOB:  [birth year] Reason for consult: stroke code Requesting physician: Dr. Marjean Donna _ _ _   _ __   _ __ _ _  __ __   _ __   __ _  History of Present Illness   82 yo with hx remote infarct R basal ganglia, HL, prediabetes, prostate cancer presented with dizziness since waking up at 6am. LKW 4am when he ambulated to bathroom and felt fine. Dizziness is worse when he sits or stands. He feels unsteady when he walks especially right when he stands up but he is not ataxic when I observe him walking. No focal neurologic deficits. NIHSS = 0. CT head no acute process personal review. TNK not administered 2/2 outside the window and sx more c/w orthostatic hypotension than vertigo. CTA not performed 2/2 exam not c/w LVO.   ROS   Per HPI: all other systems reviewed and are negative  Past History   I have reviewed the following:  Past Medical History:  Diagnosis Date   Allergic rhinitis due to pollen    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 ([place]) 06/23/2019   GERD (gastroesophageal reflux disease)    Hyperlipidemia    Prediabetes 03/22/2022   Prostate cancer ([place])    cancer   Right bundle branch block    RBBB   Past Surgical History:  Procedure Laterality Date   APPENDECTOMY     CARPAL TUNNEL RELEASE Bilateral    2023   HERNIA REPAIR  85,03   rt and lt ing hernia   KNEE ARTHROSCOPY  2012   left   LEFT HEART CATH AND CORONARY ANGIOGRAPHY N/A 05/30/2018   Procedure: LEFT HEART CATH AND CORONARY ANGIOGRAPHY;  Surgeon: Wellington Hampshire, MD;  Location: [facility];  Service: Cardiovascular;  Laterality: N/A;   prostatectomy  [place]  12/2019   ROTATOR CUFF REPAIR     right   SHOULDER ARTHROSCOPY Right 01/29/2013   Procedure: RIGHT ARTHROSCOPY SHOULDER WITH DEBRIDEMENT, POSSIBLE ROTATOR CUFF REPAIR;  Surgeon:  Kerin Salen, MD;  Location: [facility];  Service: Orthopedics;  Laterality: Right;   TONSILLECTOMY     TOTAL HIP ARTHROPLASTY  2007   right    Family History  Problem Relation Age of Onset   Cancer Father        prostate   Cancer Brother        prostate   Cancer Brother        prostate   Social History   Socioeconomic History   Marital status: Married    Spouse name: Not on file   Number of children: Not on file   Years of education: Not on file   Highest education level: Not on file  Occupational History   Occupation: auctioneer    Employer: RETIRED  Tobacco Use   Smoking status: Never   Smokeless tobacco: Never  Vaping Use   Vaping Use: Never used  Substance and Sexual Activity   Alcohol use: No   Drug use: No   Sexual activity: Not on file  Other Topics Concern   Not on file  Social History Narrative   Former competitive bodybuilder, Geophysicist/field seismologist, all natural   [employer], [employer], multiple years   Social Determinants of Health   Financial Resource Strain: Low Risk  (03/13/2022)   Overall  Financial Resource Strain (CARDIA)    Difficulty of Paying Living Expenses: Not hard at all  Food Insecurity: No Food Insecurity (03/13/2022)   Hunger Vital Sign    Worried About Running Out of Food in the Last Year: Never true    Ran Out of Food in the Last Year: Never true  Transportation Needs: No Transportation Needs (03/13/2022)   PRAPARE - Hydrologist (Medical): No    Lack of Transportation (Non-Medical): No  Physical Activity: Sufficiently Active (03/13/2022)   Exercise Vital Sign    Days of Exercise per Week: 5 days    Minutes of Exercise per Session: 30 min  Stress: No Stress Concern Present (03/13/2022)   [place]    Feeling of Stress : Not at all  Social Connections: Not on file   No Known Allergies  Medications   (Not in  a hospital admission)     Current Facility-Administered Medications:    sodium chloride flush (NS) 0.9 % injection 3 mL, 3 mL, Intravenous, Once, Vanessa Ripley, MD  Current Outpatient Medications:    ondansetron (ZOFRAN-ODT) 4 MG disintegrating tablet, Take 1 tablet (4 mg total) by mouth every 8 (eight) hours as needed for up to 5 days., Disp: 15 tablet, Rfl: 0   acetaminophen (TYLENOL) 325 MG tablet, Take 2 tablets (650 mg total) by mouth every 6 (six) hours as needed for mild pain (or temp > 37.5 C (99.5 F))., Disp: , Rfl:    aspirin 81 MG tablet, Take 1 tablet (81 mg total) by mouth daily. Take aspirin 81 mg once daily by mouth for 3 weeks(21 days) and then stop, Disp: 21 tablet, Rfl: 0   atorvastatin (LIPITOR) 40 MG tablet, TAKE 1 TABLET BY MOUTH DAILY AT 6PM, Disp: 90 tablet, Rfl: 3   celecoxib (CELEBREX) 200 MG capsule, TAKE 1 CAPSULE BY MOUTH TWICE DAILY., Disp: 60 capsule, Rfl: 2   cyanocobalamin 2000 MCG tablet, Take 2,000 mcg by mouth daily., Disp: , Rfl:    fluconazole (DIFLUCAN) 150 MG tablet, Take one pill po qweek for four weeks, Disp: 4 tablet, Rfl: 0   fluocinonide cream (LIDEX) [id number]-[id number] %, Apply 1 Application topically 2 (two) times daily., Disp: , Rfl:    ketoconazole (NIZORAL) 2 % cream, Apply one application twice daily for two weeks, Disp: 15 g, Rfl: 2   pantoprazole (PROTONIX) 40 MG tablet, TAKE ONE TABLET BY MOUTH TWICE DAILY BEFORE A MEAL, Disp: 60 tablet, Rfl: 5   Red Yeast Rice Extract (RED YEAST RICE PO), Take 900 mg by mouth every morning., Disp: , Rfl:    triamcinolone cream (KENALOG) 0.1 %, APPLY TO AFFECTED AREAS TOPICALLY TWICE DAILY AS DIRECTED, Disp: 454 g, Rfl: 1   vitamin E 180 MG (400 UNITS) capsule, Take 400 Units by mouth daily., Disp: , Rfl:   Vitals   Vitals:   11/13/22 1330 11/13/22 1430 11/13/22 1500 11/13/22 1600  BP: 139/82 (!) 140/82 (!) 132/90 138/78  Pulse: 68 (!) 39 78 (!) 48  Resp: 18 20 14 15   Temp:      TempSrc:      SpO2: 99% 98% 95% 96%   Weight:         Body mass index is 25.85 kg/m.  Physical Exam   Physical Exam Gen: A&O x4, NAD HEENT: Atraumatic, normocephalic;mucous membranes moist; oropharynx clear, tongue without atrophy or fasciculations. Neck: Supple, trachea midline. Resp: CTAB, no w/r/r CV: RRR,  no m/g/r; nml S1 and S2. 2+ symmetric peripheral pulses. Abd: soft/NT/ND; nabs x 4 quad Extrem: Nml bulk; no cyanosis, clubbing, or edema.  Neuro: *MS: A&O x4. Follows multi-step commands.  *Speech: fluid, nondysarthric, able to name and repeat *CN:    I: Deferred   II,III: PERRLA, VFF by confrontation, optic discs unable to be visualized 2/2 pupillary constriction   III,IV,VI: EOMI w/o nystagmus, no ptosis   V: Sensation intact from V1 to V3 to LT   VII: Eyelid closure was full.  Smile symmetric.   VIII: Hearing intact to voice   IX,X: Voice normal, palate elevates symmetrically    XI: SCM/trap 5/5 bilat   XII: Tongue protrudes midline, no atrophy or fasciculations   *Motor:   Normal bulk.  No tremor, rigidity or bradykinesia. No pronator drift.    Strength: Dlt Bic Tri WrE WrF FgS Gr HF KnF KnE PlF DoF    Left 5 5 5 5 5 5 5 5 5 5 5 5     Right 5 5 5 5 5 5 5 5 5 5 5 5     *Sensory: Intact to light touch, pinprick, temperature vibration throughout. Symmetric. Propioception intact bilat.  No double-simultaneous extinction.  *Coordination:  Finger-to-nose, heel-to-shin, rapid alternating motions were intact. *Reflexes:  2+ and symmetric throughout without clonus; toes down-going bilat *Gait: slightly unsteady but not ataxic  NIHSS = 0  Premorbid mRS = 0   Labs   CBC:  Recent Labs  Lab 11/13/22 1219  WBC 7.2  NEUTROABS 5.9  HGB 15.3  HCT 46.6  MCV 91.9  PLT 0000000    Basic Metabolic Panel:  Lab Results  Component Value Date   NA 139 11/13/2022   K 3.9 11/13/2022   CO2 23 11/13/2022   GLUCOSE 118 (H) 11/13/2022   BUN 20 11/13/2022   CREATININE 0.79 11/13/2022   CALCIUM 8.8 (L)  11/13/2022   GFRNONAA >60 11/13/2022   GFRAA >60 01/06/2020   Lipid Panel:  Lab Results  Component Value Date   LDLCALC 55 03/15/2022   HgbA1c:  Lab Results  Component Value Date   HGBA1C 6.4 03/15/2022   Urine Drug Screen:     Component Value Date/Time   LABOPIA NONE DETECTED 06/18/2019 2113   COCAINSCRNUR NONE DETECTED 06/18/2019 2113   LABBENZ POSITIVE (A) 06/18/2019 2113   AMPHETMU NONE DETECTED 06/18/2019 2113   THCU NONE DETECTED 06/18/2019 2113   LABBARB NONE DETECTED 06/18/2019 2113    Alcohol Level     Component Value Date/Time   ETH <10 11/13/2022 1219     Impression   82 yo with hx remote infarct R basal ganglia, HL, prediabetes, prostate cancer presented with dizziness since waking up at 6am. He feels unsteady when he walks especially right when he stands up but he is not ataxic when I observe him walking. No focal neurologic deficits. NIHSS = 0. Sx resolved with IV fluids c/w orthostatic hypotension. MRI brain negative for acute infarct (personal review).  Recommendations   - If passes ambulation trial, OK to d/c from ED without further neuro workup unless he has other medical indications for admission ______________________________________________________________________   Thank you for the opportunity to take part in the care of this patient. If you have any further questions, please contact the neurology consultation attending.  Signed,  Su Monks, MD [facility] [phone]  If 7pm- 7am, please page neurology on call as listed in [place].  **Any copied and pasted documentation in this note was written  by me in another application not billed for and pasted by me into this document.

## 2022-11-13 NOTE — Telephone Encounter (Signed)
Patient called in stating that he has been very dizzy and nauseous since he woke up this morning.He can barely stand up and walk. I sent him over to speak to access nurse.

## 2022-11-13 NOTE — ED Provider Notes (Signed)
[facility] Provider Note    Event Date/Time   First MD Initiated Contact with Patient 11/13/22 1222     (approximate)   History   Dizziness   HPI  Scott [name] is a 82 y.o. male otherwise healthy comes in with sudden onset of dizziness and difficulty ambulating.  Patient reports that he woke up around 4 AM went to the bathroom and felt fine.  Then when he woke up around 6 AM he started to have some dizziness, difficulty ambulating, nausea.  Denies any falls hitting his head.  Denies any chest pain, shortness of breath, abdominal pain.  Denies this ever happening previously.   Physical Exam   Triage Vital Signs: ED Triage Vitals  Enc Vitals Group     BP 11/13/22 1214 (!) 150/92     Pulse Rate 11/13/22 1214 72     Resp 11/13/22 1214 17     Temp 11/13/22 1214 98.1 F (36.7 C)     Temp Source 11/13/22 1214 Oral     SpO2 11/13/22 1214 96 %     Weight 11/13/22 1216 170 lb (77.1 kg)     Height --      Head Circumference --      Peak Flow --      Pain Score 11/13/22 1216 4     Pain Loc --      Pain Edu? --      Excl. in [place]? --     Most recent vital signs: Vitals:   11/13/22 1214  BP: (!) 150/92  Pulse: 72  Resp: 17  Temp: 98.1 F (36.7 C)  SpO2: 96%     General: Awake, no distress.  CV:  Good peripheral perfusion.  Resp:  Normal effort.  Abd:  No distention.  Soft nontender Other:  NIH 1 TM clear bilaterally  ED Results / Procedures / Treatments   Labs (all labs ordered are listed, but only abnormal results are displayed) Labs Reviewed  COMPREHENSIVE METABOLIC PANEL - Abnormal; Notable for the following components:      Result Value   Glucose, Bld 118 (*)    Calcium 8.8 (*)    All other components within normal limits  CBG MONITORING, ED - Abnormal; Notable for the following components:   Glucose-Capillary 113 (*)    All other components within normal limits  PROTIME-INR  APTT  CBC  DIFFERENTIAL  ETHANOL  TROPONIN I  (HIGH SENSITIVITY)     EKG  My interpretation of EKG:  Normal sinus bradycardia rate of 59 without any ST elevation, T wave version in V3 with a right bundle branch block.  Reviewed prior EKG and he had similar right bundle branch block  RADIOLOGY I have reviewed the CT head personally interpreted and no evidence of intracranial hemorrhage  PROCEDURES:  Critical Care performed: Yes, see critical care procedure note(s)  .1-3 Lead EKG Interpretation  Performed by: Vanessa [place], MD Authorized by: Vanessa [place], MD     Interpretation: normal     ECG rate:  60   ECG rate assessment: normal     Rhythm: sinus rhythm     Ectopy: none     Conduction: normal   .Critical Care  Performed by: Vanessa [place], MD Authorized by: Vanessa , MD   Critical care provider statement:    Critical care time (minutes):  30   Critical care was necessary to treat or prevent imminent or life-threatening deterioration of the following  conditions: stroke code.   Critical care was time spent personally by me on the following activities:  Development of treatment plan with patient or surrogate, discussions with consultants, evaluation of patient's response to treatment, examination of patient, ordering and review of laboratory studies, ordering and review of radiographic studies, ordering and performing treatments and interventions, pulse oximetry, re-evaluation of patient's condition and review of old charts    [place] ED: Medications  sodium chloride flush (NS) 0.9 % injection 3 mL (has no administration in time range)     IMPRESSION / MDM / [place] / ED COURSE  I reviewed the triage vital signs and the nursing notes.   Patient's presentation is most consistent with acute presentation with potential threat to life or bodily function.   Stroke code was called from triage.  Patient is not a TNK candidate but could be an LVO candidate for potential posterior stroke.   Differential for dizziness includes stroke, UTI, ACS.  Coags normal initial troponin is negative we will get a repeat.  Glucose normal CBC normal CMP normal.  CT head was negative.  Discussed with Dr. Quinn Axe from neurology recommended MRI and if negative ambulation trial and potential discharge home.  Patient given some IV Ativan to help with MRI  MRI Is negative for stroke.  Reevaluated patient he denies any symptoms at rest reports only symptoms when he tries to sit up or stand up with some mild dizziness.  He is orthostatics are mostly negative but his heart rate does jump up about 20 points.  Will give some IV fluids, getting UA and do an ambulation trial.  If this is negative suspect patient will be able to be discharged home.  The patient is on the cardiac monitor to evaluate for evidence of arrhythmia and/or significant heart rate changes.      FINAL CLINICAL IMPRESSION(S) / ED DIAGNOSES   Final diagnoses:  Dizziness     Rx / DC Orders   ED Discharge Orders          Ordered    ondansetron (ZOFRAN-ODT) 4 MG disintegrating tablet  Every 8 hours PRN        11/13/22 1534             Note:  This document was prepared using Dragon voice recognition software and may include unintentional dictation errors.   Vanessa [place], MD 11/13/22 [phone]

## 2022-11-13 NOTE — Progress Notes (Signed)
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 Authorizing Provider  acetaminophen (TYLENOL) 325 MG tablet Take 2 tablets (650 mg total) by mouth every 6 (six) hours as needed for mild pain (or temp > 37.5 C (99.5 F)). 06/19/19   Nicholes Mango, MD  aspirin 81 MG tablet Take 1 tablet (81 mg total) by mouth daily. Take aspirin 81 mg once daily by mouth for 3 weeks(21 days) and then stop 06/19/19   Gouru, Illene Silver, MD  atorvastatin (LIPITOR) 40 MG tablet TAKE 1 TABLET BY MOUTH DAILY AT [facility] 04/17/22   Copland, Frederico Hamman, MD  celecoxib (CELEBREX) 200 MG capsule TAKE 1 CAPSULE BY MOUTH TWICE DAILY. 08/08/22   Copland, Frederico Hamman, MD  cyanocobalamin 2000 MCG tablet Take 2,000 mcg by mouth daily.    [provider]  fluconazole (DIFLUCAN) 150 MG tablet Take one pill po qweek for four weeks 07/04/22   Eugenia Pancoast, FNP  fluocinonide cream (LIDEX) [id number] % Apply 1 Application topically 2 (two) times daily. 08/31/22   [provider]  ketoconazole (NIZORAL) 2 % cream Apply one application twice daily for two weeks 07/04/22   Eugenia Pancoast, FNP  pantoprazole (PROTONIX) 40 MG tablet TAKE ONE TABLET BY MOUTH TWICE DAILY BEFORE A MEAL 07/23/22   Copland, Frederico Hamman, MD  Red Yeast Rice Extract (RED YEAST RICE PO) Take 900 mg by mouth every morning.    [provider]  triamcinolone cream (KENALOG) 0.1 %  APPLY TO AFFECTED AREAS TOPICALLY TWICE DAILY AS DIRECTED 10/10/22   Copland, Frederico Hamman, MD  vitamin E 180 MG (400 UNITS) capsule Take 400 Units by mouth daily.    [provider]    Lorin Picket ,PharmD Clinical Pharmacist  11/13/2022  12:34 PM

## 2022-11-14 ENCOUNTER — Other Ambulatory Visit: Payer: Self-pay | Admitting: Family Medicine

## 2022-11-14 NOTE — Telephone Encounter (Signed)
Last office visit 09/13/22 for rash. Last refilled 08/18/22 for #60 with 2 refills.  Next Appt: 11/20/22 for ED follow up.

## 2022-11-16 ENCOUNTER — Telehealth: Payer: Self-pay | Admitting: *Deleted

## 2022-11-16 NOTE — Telephone Encounter (Signed)
     Patient  visit on 11/13/2022  at [facility] was for treatment   Have you been able to follow up with your primary care physician?Feeling fine had avirus , can get medications and has transportation if needed to pcp   The patient was able to obtain any needed medicine or equipment.  Are there diet recommendations that you are having difficulty following?  Patient expresses understanding of discharge instructions and education provided has no other needs at this time.    [place] [phone] [address] , [place] [address] Email : [email or web address]

## 2022-11-19 NOTE — Progress Notes (Unsigned)
    [place] T. Deyra Perdomo, MD, [place] at [facility] [place], [address]  Phone: [phone]  FAX: [phone]  Scott [name] - 82 y.o. male  MRN [id number]  Date of Birth: [birth year]  Date: 11/20/2022  PCP: Owens Loffler, MD  Referral: Owens Loffler, MD  No chief complaint on file.  Subjective:   Scott [name] is a 82 y.o. very pleasant male patient with There is no height or weight on file to calculate BMI. who presents with the following:  Patient presents today for ER follow-up.  He presented to the ER on November 13, 2022.  He developed some acute dizziness and difficulty walking, and there was concern about possible stroke, given his history of stroke.  He was referred to the ER by triage.  He also had a fairly normal EKG as well as troponins.  Remainder of his lab work was otherwise fairly unremarkable.  He had an MRI of the brain which was also negative for stroke. On his orthostatics, his heart rate did increase by 20 points with standing.  He received some IV fluids in the hospital, and his dizziness improved.  Cardiac monitor showed no any kind of arrhythmia.    Review of Systems is noted in the HPI, as appropriate  Objective:   There were no vitals taken for this visit.  GEN: No acute distress; alert,appropriate. PULM: Breathing comfortably in no respiratory distress PSYCH: Normally interactive.   Laboratory and Imaging Data:  Assessment and Plan:   ***

## 2022-11-20 ENCOUNTER — Ambulatory Visit (INDEPENDENT_AMBULATORY_CARE_PROVIDER_SITE_OTHER): Payer: Medicare HMO | Admitting: Family Medicine

## 2022-11-20 ENCOUNTER — Encounter: Payer: Self-pay | Admitting: Family Medicine

## 2022-11-20 VITALS — BP 120/72 | HR 83 | Temp 98.1°F | Ht 68.0 in | Wt 163.0 lb

## 2022-11-20 DIAGNOSIS — Z8673 Personal history of transient ischemic attack (TIA), and cerebral infarction without residual deficits: Secondary | ICD-10-CM

## 2022-11-20 DIAGNOSIS — R21 Rash and other nonspecific skin eruption: Secondary | ICD-10-CM | POA: Diagnosis not present

## 2022-11-20 DIAGNOSIS — R262 Difficulty in walking, not elsewhere classified: Secondary | ICD-10-CM

## 2022-11-20 DIAGNOSIS — R42 Dizziness and giddiness: Secondary | ICD-10-CM | POA: Diagnosis not present

## 2022-11-20 MED ORDER — PREDNISONE 20 MG PO TABS: ORAL_TABLET | ORAL | 0 refills | Status: DC

## 2022-12-26 DIAGNOSIS — M25512 Pain in left shoulder: Secondary | ICD-10-CM | POA: Diagnosis not present

## 2023-01-15 NOTE — Progress Notes (Signed)
Inr/PTT/drug screen/etoh ordered to evaluate for causes of the dizziness.

## 2023-01-24 ENCOUNTER — Other Ambulatory Visit: Payer: Self-pay | Admitting: Family Medicine

## 2023-02-20 DIAGNOSIS — L111 Transient acantholytic dermatosis [Grover]: Secondary | ICD-10-CM | POA: Diagnosis not present

## 2023-02-20 DIAGNOSIS — B356 Tinea cruris: Secondary | ICD-10-CM | POA: Diagnosis not present

## 2023-02-20 DIAGNOSIS — Z85828 Personal history of other malignant neoplasm of skin: Secondary | ICD-10-CM | POA: Diagnosis not present

## 2023-03-19 ENCOUNTER — Other Ambulatory Visit: Payer: Self-pay | Admitting: Family Medicine

## 2023-03-19 NOTE — Telephone Encounter (Signed)
Please schedule Medicare Wellness with nurse and CPE with fasting labs prior with Dr. Copland. °

## 2023-03-20 ENCOUNTER — Telehealth: Payer: Self-pay | Admitting: Family Medicine

## 2023-03-20 MED ORDER — PANTOPRAZOLE SODIUM 40 MG PO TBEC: 40.0000 mg | DELAYED_RELEASE_TABLET | Freq: Two times a day (BID) | ORAL | 3 refills | Status: DC

## 2023-03-20 NOTE — Telephone Encounter (Signed)
Okay to increase to 2 tablets daily?

## 2023-03-20 NOTE — Telephone Encounter (Signed)
Spoke to pt, scheduled cpe for 05/23/23. Pt also mentioned a refill on PROTONIX meds. Pt states he has been taking 2 tablets daily instead of 1. Current instructions on recent refill states "take 1 tablet daily". Pt asked if instructions can be updated due to 1 tablet daily not being enough for him? Call back # [phone]

## 2023-03-20 NOTE — Telephone Encounter (Signed)
error 

## 2023-03-25 NOTE — Progress Notes (Unsigned)
     T. , MD, CAQ Sports Medicine [facility] at [facility] [address] [place], [address]  Phone: [phone]  FAX: [phone]  Scott [name] - 82 y.o. male  MRN [id number]  Date of Birth: 08-26-1940  Date: 03/26/2023  PCP: Hannah Beat, MD  Referral: Hannah Beat, MD  No chief complaint on file.  Subjective:   Scott [name] is a 82 y.o. very pleasant male patient with There is no height or weight on file to calculate BMI. who presents with the following:  Left lower quadrant abdominal pain:     Review of Systems is noted in the HPI, as appropriate  Objective:   There were no vitals taken for this visit.  GEN: No acute distress; alert,appropriate. PULM: Breathing comfortably in no respiratory distress PSYCH: Normally interactive.   Laboratory and Imaging Data:  Assessment and Plan:   ***

## 2023-03-26 ENCOUNTER — Encounter: Payer: Self-pay | Admitting: Family Medicine

## 2023-03-26 ENCOUNTER — Ambulatory Visit (INDEPENDENT_AMBULATORY_CARE_PROVIDER_SITE_OTHER): Payer: Medicare HMO | Admitting: Family Medicine

## 2023-03-26 VITALS — BP 126/78 | HR 65 | Temp 98.0°F | Ht 68.0 in | Wt 167.0 lb

## 2023-03-26 DIAGNOSIS — R109 Unspecified abdominal pain: Secondary | ICD-10-CM

## 2023-03-26 DIAGNOSIS — R1032 Left lower quadrant pain: Secondary | ICD-10-CM

## 2023-03-26 DIAGNOSIS — M62838 Other muscle spasm: Secondary | ICD-10-CM | POA: Diagnosis not present

## 2023-03-26 NOTE — Patient Instructions (Signed)
Take Celecoxib twice a day for the next 2-3 weeks  Try some topical Voltaren gel  Voltaren 1% gel, over the counter You can apply up to 4 times a day  This can be applied to any joint: knee, wrist, fingers, elbows, shoulders, feet and ankles. Can apply to any tendon: tennis elbow, achilles, tendon, rotator cuff or any other tendon.  Minimal is absorbed in the bloodstream: ok with oral anti-inflammatory or a blood thinner.  Cost is about 9 dollars

## 2023-04-26 ENCOUNTER — Other Ambulatory Visit: Payer: Self-pay | Admitting: Family Medicine

## 2023-04-26 NOTE — Telephone Encounter (Signed)
Last office visit 08/5/4 fir acute left flank pain.  Last refilled 11/14/22 for #60 with 2 refill.  Next Appt: CPE 05/23/2023.

## 2023-05-02 ENCOUNTER — Telehealth: Payer: Self-pay | Admitting: *Deleted

## 2023-05-02 DIAGNOSIS — E785 Hyperlipidemia, unspecified: Secondary | ICD-10-CM

## 2023-05-02 DIAGNOSIS — R21 Rash and other nonspecific skin eruption: Secondary | ICD-10-CM | POA: Diagnosis not present

## 2023-05-02 DIAGNOSIS — C61 Malignant neoplasm of prostate: Secondary | ICD-10-CM

## 2023-05-02 DIAGNOSIS — R509 Fever, unspecified: Secondary | ICD-10-CM | POA: Diagnosis not present

## 2023-05-02 DIAGNOSIS — M791 Myalgia, unspecified site: Secondary | ICD-10-CM | POA: Diagnosis not present

## 2023-05-02 DIAGNOSIS — Z79899 Other long term (current) drug therapy: Secondary | ICD-10-CM

## 2023-05-02 DIAGNOSIS — R7303 Prediabetes: Secondary | ICD-10-CM

## 2023-05-02 DIAGNOSIS — S2096XA Insect bite (nonvenomous) of unspecified parts of thorax, initial encounter: Secondary | ICD-10-CM | POA: Diagnosis not present

## 2023-05-02 NOTE — Telephone Encounter (Signed)
-----   Message from [facility] T sent at 05/02/2023  3:46 PM EDT ----- Regarding: Order Patient needs labs for cpe ordered.

## 2023-05-14 DIAGNOSIS — R5383 Other fatigue: Secondary | ICD-10-CM | POA: Diagnosis not present

## 2023-05-14 DIAGNOSIS — M791 Myalgia, unspecified site: Secondary | ICD-10-CM | POA: Diagnosis not present

## 2023-05-15 ENCOUNTER — Ambulatory Visit (INDEPENDENT_AMBULATORY_CARE_PROVIDER_SITE_OTHER): Payer: Medicare HMO

## 2023-05-15 VITALS — Ht 68.0 in | Wt 165.0 lb

## 2023-05-15 DIAGNOSIS — Z0000 Encounter for general adult medical examination without abnormal findings: Secondary | ICD-10-CM

## 2023-05-15 NOTE — Patient Instructions (Addendum)
Scott [name] , Thank you for taking time to come for your Medicare Wellness Visit. I appreciate your ongoing commitment to your health goals. Please review the following plan we discussed and let me know if I can assist you in the future.   Referrals/Orders/Follow-Ups/Clinician Recommendations:   This is a list of the screening recommended for you and due dates:  Health Maintenance  Topic Date Due   DTaP/Tdap/Td vaccine (1 - Tdap) Never done   Zoster (Shingles) Vaccine (1 of 2) Never done   COVID-19 Vaccine (3 - Pfizer risk series) 11/03/2019   Flu Shot  11/19/2023*   Medicare Annual Wellness Visit  05/14/2024   Colon Cancer Screening  06/22/2027   Pneumonia Vaccine  Completed   HPV Vaccine  Aged Out   Hepatitis C Screening  Discontinued  *Topic was postponed. The date shown is not the original due date.    Advanced directives: (Copy Requested) Please bring a copy of your health care power of attorney and living will to the office to be added to your chart at your convenience.  Next Medicare Annual Wellness Visit scheduled for next year: Yes

## 2023-05-15 NOTE — Progress Notes (Signed)
Subjective:   Scott [name] is a 82 y.o. male who presents for Medicare Annual/Subsequent preventive examination.  Visit Complete: Virtual  I connected with  Scott [name] on 05/15/23 by a audio enabled telemedicine application and verified that I am speaking with the correct person using two identifiers.  Patient Location: Home  Provider Location: Home Office  I discussed the limitations of evaluation and management by telemedicine. The patient expressed understanding and agreed to proceed.   Vital Signs: Unable to obtain new vitals due to this being a telehealth visit. Cardiac Risk Factors include: advanced age (>82men, >19 women);male gender     Objective:    Today's Vitals   05/15/23 0826  Weight: 165 lb (74.8 kg)  Height: 5\' 8"  (1.727 m)   Body mass index is 25.09 kg/m.     05/15/2023    8:34 AM 03/13/2022    8:16 AM 03/10/2021    2:08 PM 01/05/2020    9:01 AM 01/03/2020    4:23 PM 01/02/2020    8:07 PM 06/18/2019    6:21 PM  Advanced Directives  Does Patient Have a Medical Advance Directive? Yes Yes No No No No Yes  Type of Estate agent of [place];Living will Healthcare Power of [place];Living will     Living will  Does patient want to make changes to medical advance directive?   No - Patient declined    No - Patient declined  Copy of Healthcare Power of Attorney in Chart? No - copy requested No - copy requested       Would patient like information on creating a medical advance directive?    No - Patient declined No - Patient declined No - Patient declined No - Patient declined    Current Medications (verified) Outpatient Encounter Medications as of 05/15/2023  Medication Sig   acetaminophen (TYLENOL) 325 MG tablet Take 2 tablets (650 mg total) by mouth every 6 (six) hours as needed for mild pain (or temp > 37.5 C (99.5 F)).   aspirin 81 MG tablet Take 1 tablet (81 mg total) by mouth daily. Take aspirin 81 mg once daily by mouth for 3  weeks(21 days) and then stop   atorvastatin (LIPITOR) 40 MG tablet TAKE 1 TABLET BY MOUTH DAILY AT 6PM   celecoxib (CELEBREX) 200 MG capsule TAKE 1 CAPSULE BY MOUTH TWICE DAILY.   cyanocobalamin 2000 MCG tablet Take 2,000 mcg by mouth daily.   fluocinonide cream (LIDEX) 0.05 % Apply 1 Application topically 2 (two) times daily.   ketoconazole (NIZORAL) 2 % cream Apply one application twice daily for two weeks   mometasone (ELOCON) 0.1 % cream Apply 1 Application topically daily.   pantoprazole (PROTONIX) 40 MG tablet Take 1 tablet (40 mg total) by mouth 2 (two) times daily.   Red Yeast Rice Extract (RED YEAST RICE PO) Take 900 mg by mouth every morning.   triamcinolone cream (KENALOG) 0.1 % APPLY TO AFFECTED AREAS TOPICALLY TWICE DAILY AS DIRECTED   vitamin E 180 MG (400 UNITS) capsule Take 400 Units by mouth daily.   No facility-administered encounter medications on file as of 05/15/2023.    Allergies (verified) Patient has no known allergies.   History: Past Medical History:  Diagnosis Date   Allergic rhinitis due to pollen    Cerebrovascular accident (CVA) of [name] basal ganglia ([facility]) 06/23/2019   GERD (gastroesophageal reflux disease)    Hyperlipidemia    Prediabetes 03/22/2022   Prostate cancer ([facility])  cancer   Past Surgical History:  Procedure Laterality Date   APPENDECTOMY     CARPAL TUNNEL RELEASE Bilateral    2023   HERNIA REPAIR  85,03   rt and lt ing hernia   KNEE ARTHROSCOPY  2012   left   LEFT HEART CATH AND CORONARY ANGIOGRAPHY N/A 05/30/2018   Procedure: LEFT HEART CATH AND CORONARY ANGIOGRAPHY;  Surgeon: Iran Ouch, MD;  Location: [facility] INVASIVE CV LAB;  Service: Cardiovascular;  Laterality: N/A;   prostatectomy  1998   ROBOTIC ASSISTED LAPAROSCOPIC CHOLECYSTECTOMY  12/2019   ROTATOR CUFF REPAIR     [name]   SHOULDER ARTHROSCOPY [name] 01/29/2013   Procedure: [name] ARTHROSCOPY SHOULDER WITH DEBRIDEMENT, POSSIBLE ROTATOR CUFF REPAIR;  Surgeon: Nestor Lewandowsky, MD;  Location: [facility];  Service: Orthopedics;  Laterality: [name];   TONSILLECTOMY     TOTAL HIP ARTHROPLASTY  2007   [name]    Family History  Problem Relation Age of Onset   Cancer Father        prostate   Cancer Brother        prostate   Cancer Brother        prostate   Social History   Socioeconomic History   Marital status: Married    Spouse name: Not on file   Number of children: Not on file   Years of education: Not on file   Highest education level: Not on file  Occupational History   Occupation: auctioneer    Employer: RETIRED  Tobacco Use   Smoking status: Never   Smokeless tobacco: Never  Vaping Use   Vaping status: Never Used  Substance and Sexual Activity   Alcohol use: No   Drug use: No   Sexual activity: Not on file  Other Topics Concern   Not on file  Social History Narrative   Former competitive bodybuilder, Facilities manager, all natural   [employer], [place], [employer], multiple years   Social Determinants of Health   Financial Resource Strain: Low Risk  (05/15/2023)   Overall Financial Resource Strain (CARDIA)    Difficulty of Paying Living Expenses: Not hard at all  Food Insecurity: No Food Insecurity (05/15/2023)   Hunger Vital Sign    Worried About Running Out of Food in the Last Year: Never true    Ran Out of Food in the Last Year: Never true  Transportation Needs: No Transportation Needs (03/13/2022)   PRAPARE - Administrator, Civil Service (Medical): No    Lack of Transportation (Non-Medical): No  Physical Activity: Sufficiently Active (05/15/2023)   Exercise Vital Sign    Days of Exercise per Week: 5 days    Minutes of Exercise per Session: 30 min  Stress: No Stress Concern Present (05/15/2023)   [employer] of Occupational Health - Occupational Stress Questionnaire    Feeling of Stress : Not at all  Social Connections: Socially Integrated (05/15/2023)   Social Connection and Isolation  Panel [NHANES]    Frequency of Communication with Friends and Family: More than three times a week    Frequency of Social Gatherings with Friends and Family: More than three times a week    Attends Religious Services: More than 4 times per year    Active Member of [employer] or Organizations: Yes    Attends Engineer, structural: More than 4 times per year    Marital Status: Married    Tobacco Counseling Counseling given: Not Answered  Clinical Intake:  Pre-visit preparation completed: Yes  Pain : No/denies pain     BMI - recorded: 25.09 Nutritional Status: BMI 25 -29 Overweight Nutritional Risks: None Diabetes: No  How often do you need to have someone help you when you read instructions, pamphlets, or other written materials from your doctor or pharmacy?: 1 - Never  Interpreter Needed?: No  Information entered by :: Theresa Mulligan LPN   Activities of Daily Living    05/15/2023    8:32 AM  In your present state of health, do you have any difficulty performing the following activities:  Hearing? 0  Vision? 0  Difficulty concentrating or making decisions? 0  Walking or climbing stairs? 0  Dressing or bathing? 0  Doing errands, shopping? 0  Preparing Food and eating ? N  Using the Toilet? N  In the past six months, have you accidently leaked urine? N  Do you have problems with loss of bowel control? N  Managing your Medications? N  Managing your Finances? N  Housekeeping or managing your Housekeeping? N    Patient Care Team: Hannah Beat, MD as PCP - General Iran Ouch, MD as PCP - Cardiology (Cardiology) Laverna Peace, MD as Attending Physician (Internal Medicine) Doristine Johns, [employer] Team 7, MD as Rounding Team (Internal Medicine)  Indicate any recent Medical Services you may have received from other than [facility] providers in the past year (date may be approximate).     Assessment:   This is a routine wellness examination for  Jaegar.  Hearing/Vision screen Hearing Screening - Comments:: Denies hearing difficulties   Vision Screening - Comments:: - up to date with routine eye exams with  Dr Elmer Picker   Goals Addressed               This Visit's Progress     Stay Healthy (pt-stated)         Depression Screen    05/15/2023    8:31 AM 09/13/2022    2:30 PM 03/13/2022    8:17 AM 03/10/2021    2:09 PM 10/17/2018    9:17 AM 09/10/2017    3:04 PM 08/10/2016   11:00 AM  PHQ 2/9 Scores  PHQ - 2 Score 0 0 0 0 0 2 2  PHQ- 9 Score    0  5 11    Fall Risk    05/15/2023    8:33 AM 09/13/2022    2:29 PM 03/13/2022    8:17 AM 03/10/2021    2:09 PM 10/17/2018    9:17 AM  Fall Risk   Falls in the past year? 0 0 0 0 0  Number falls in past yr: 0 0 0 0 0  Injury with Fall? 0 0 0 0 0  Risk for fall due to : No Fall Risks No Fall Risks Medication side effect Medication side effect   Follow up Falls prevention discussed Falls evaluation completed Falls evaluation completed;Education provided;Falls prevention discussed Falls evaluation completed;Falls prevention discussed     MEDICARE RISK AT HOME: Medicare Risk at Home Any stairs in or around the home?: No If so, are there any without handrails?: Yes Home free of loose throw rugs in walkways, pet beds, electrical cords, etc?: Yes Adequate lighting in your home to reduce risk of falls?: Yes Life alert?: No Use of a cane, walker or w/c?: No Grab bars in the bathroom?: No Shower chair or bench in shower?: No Elevated toilet seat or a handicapped toilet?: No  TIMED  UP AND GO:  Was the test performed?  No    Cognitive Function:    03/10/2021    2:10 PM 09/10/2017    3:06 PM  MMSE - Mini Mental State Exam  Not completed: Unable to complete   Orientation to time  5  Orientation to Place  5  Registration  3  Attention/ Calculation  0  Recall  3  Language- name 2 objects  0  Language- repeat  1  Language- follow 3 step command  3  Language- read & follow  direction  0  Write a sentence  0  Copy design  0  Total score  20        05/15/2023    8:35 AM 03/13/2022    8:18 AM  6CIT Screen  What Year? 0 points 0 points  What month? 0 points 0 points  What time? 0 points 0 points  Count back from 20 0 points 0 points  Months in reverse 0 points 4 points  Repeat phrase 0 points 10 points  Total Score 0 points 14 points    Immunizations Immunization History  Administered Date(s) Administered   Fluad Quad(high Dose 65+) 05/25/2022   Influenza Split 06/13/2011   Influenza, High Dose Seasonal PF 05/09/2018, 04/30/2019, 05/26/2021   Influenza,inj,Quad PF,6+ Mos 05/05/2015   Influenza-Unspecified 06/01/2014, 06/21/2017, 06/04/2020   PFIZER(Purple Top)SARS-COV-2 Vaccination 09/05/2019, 10/06/2019   Pneumococcal Conjugate-13 11/26/2013   Pneumococcal Polysaccharide-23 05/05/2015   Respiratory Syncytial Virus Vaccine,Recomb Aduvanted(Arexvy) 07/05/2022    TDAP status: Due, Education has been provided regarding the importance of this vaccine. Advised may receive this vaccine at local pharmacy or Health Dept. Aware to provide a copy of the vaccination record if obtained from local pharmacy or Health Dept. Verbalized acceptance and understanding.  Flu Vaccine status: Due, Education has been provided regarding the importance of this vaccine. Advised may receive this vaccine at local pharmacy or Health Dept. Aware to provide a copy of the vaccination record if obtained from local pharmacy or Health Dept. Verbalized acceptance and understanding.  Pneumococcal vaccine status: Up to date  Covid-19 vaccine status: Declined, Education has been provided regarding the importance of this vaccine but patient still declined. Advised may receive this vaccine at local pharmacy or Health Dept.or vaccine clinic. Aware to provide a copy of the vaccination record if obtained from local pharmacy or Health Dept. Verbalized acceptance and understanding.  Qualifies for  Shingles Vaccine? Yes   Zostavax completed No   Shingrix Completed?: No.    Education has been provided regarding the importance of this vaccine. Patient has been advised to call insurance company to determine out of pocket expense if they have not yet received this vaccine. Advised may also receive vaccine at local pharmacy or Health Dept. Verbalized acceptance and understanding.  Screening Tests Health Maintenance  Topic Date Due   DTaP/Tdap/Td (1 - Tdap) Never done   Zoster Vaccines- Shingrix (1 of 2) Never done   COVID-19 Vaccine (3 - Pfizer risk series) 11/03/2019   INFLUENZA VACCINE  11/19/2023 (Originally 03/22/2023)   Medicare Annual Wellness (AWV)  05/14/2024   Colonoscopy  06/22/2027   Pneumonia Vaccine 85+ Years old  Completed   HPV VACCINES  Aged Out   Hepatitis C Screening  Discontinued    Health Maintenance  Health Maintenance Due  Topic Date Due   DTaP/Tdap/Td (1 - Tdap) Never done   Zoster Vaccines- Shingrix (1 of 2) Never done   COVID-19 Vaccine (3 - Pfizer risk series)  11/03/2019    Colorectal cancer screening: Type of screening: Colonoscopy. Completed 06/21/22. Repeat every 5 years    Additional Screening:   Vision Screening: Recommended annual ophthalmology exams for early detection of glaucoma and other disorders of the eye. Is the patient up to date with their annual eye exam?  Yes  Who is the provider or what is the name of the office in which the patient attends annual eye exams? Dr Elmer Picker If pt is not established with a provider, would they like to be referred to a provider to establish care? No .   Dental Screening: Recommended annual dental exams for proper oral hygiene    Community Resource Referral / Chronic Care Management:  CRR required this visit?  No   CCM required this visit?  No     Plan:     I have personally reviewed and noted the following in the patient's chart:   Medical and social history Use of alcohol, tobacco or illicit  drugs  Current medications and supplements including opioid prescriptions. Patient is not currently taking opioid prescriptions. Functional ability and status Nutritional status Physical activity Advanced directives List of other physicians Hospitalizations, surgeries, and ER visits in previous 12 months Vitals Screenings to include cognitive, depression, and falls Referrals and appointments  In addition, I have reviewed and discussed with patient certain preventive protocols, quality metrics, and best practice recommendations. A written personalized care plan for preventive services as well as general preventive health recommendations were provided to patient.     Tillie Rung, LPN   9/81/1914   After Visit Summary: (MyChart) Due to this being a telephonic visit, the after visit summary with patients personalized plan was offered to patient via MyChart   Nurse Notes: None

## 2023-05-16 ENCOUNTER — Other Ambulatory Visit (INDEPENDENT_AMBULATORY_CARE_PROVIDER_SITE_OTHER): Payer: Medicare HMO

## 2023-05-16 DIAGNOSIS — R7303 Prediabetes: Secondary | ICD-10-CM

## 2023-05-16 DIAGNOSIS — C61 Malignant neoplasm of prostate: Secondary | ICD-10-CM

## 2023-05-16 DIAGNOSIS — Z79899 Other long term (current) drug therapy: Secondary | ICD-10-CM

## 2023-05-16 DIAGNOSIS — E785 Hyperlipidemia, unspecified: Secondary | ICD-10-CM | POA: Diagnosis not present

## 2023-05-16 LAB — CBC WITH DIFFERENTIAL/PLATELET
Basophils Absolute: 0 10*3/uL (ref 0.0–0.1)
Basophils Relative: 0.3 % (ref 0.0–3.0)
Eosinophils Absolute: 0.1 10*3/uL (ref 0.0–0.7)
Eosinophils Relative: 0.6 % (ref 0.0–5.0)
HCT: 43.5 % (ref 39.0–52.0)
Hemoglobin: 14.2 g/dL (ref 13.0–17.0)
Lymphocytes Relative: 10.6 % — ABNORMAL LOW (ref 12.0–46.0)
Lymphs Abs: 1.2 10*3/uL (ref 0.7–4.0)
MCHC: 32.6 g/dL (ref 30.0–36.0)
MCV: 91.9 fl (ref 78.0–100.0)
Monocytes Absolute: 0.9 10*3/uL (ref 0.1–1.0)
Monocytes Relative: 8.3 % (ref 3.0–12.0)
Neutro Abs: 9.2 10*3/uL — ABNORMAL HIGH (ref 1.4–7.7)
Neutrophils Relative %: 80.2 % — ABNORMAL HIGH (ref 43.0–77.0)
Platelets: 205 10*3/uL (ref 150.0–400.0)
RBC: 4.73 Mil/uL (ref 4.22–5.81)
RDW: 14.7 % (ref 11.5–15.5)
WBC: 11.5 10*3/uL — ABNORMAL HIGH (ref 4.0–10.5)

## 2023-05-16 LAB — PSA, MEDICARE: PSA: 0 ng/ml — ABNORMAL LOW (ref 0.10–4.00)

## 2023-05-16 LAB — HEPATIC FUNCTION PANEL
ALT: 22 U/L (ref 0–53)
AST: 17 U/L (ref 0–37)
Albumin: 3.9 g/dL (ref 3.5–5.2)
Alkaline Phosphatase: 58 U/L (ref 39–117)
Bilirubin, Direct: 0.2 mg/dL (ref 0.0–0.3)
Total Bilirubin: 1.1 mg/dL (ref 0.2–1.2)
Total Protein: 6.1 g/dL (ref 6.0–8.3)

## 2023-05-16 LAB — LIPID PANEL
Cholesterol: 149 mg/dL (ref 0–200)
HDL: 39.3 mg/dL (ref >39.00)
LDL Cholesterol: 84 mg/dL (ref 0–99)
NonHDL: 110.14
Total CHOL/HDL Ratio: 4
Triglycerides: 129 mg/dL (ref 0.0–149.0)
VLDL: 25.8 mg/dL (ref 0.0–40.0)

## 2023-05-16 LAB — BASIC METABOLIC PANEL
BUN: 24 mg/dL — ABNORMAL HIGH (ref 6–23)
CO2: 27 mEq/L (ref 19–32)
Calcium: 8.9 mg/dL (ref 8.4–10.5)
Chloride: 105 mEq/L (ref 96–112)
Creatinine, Ser: 0.9 mg/dL (ref 0.40–1.50)
GFR: 80.85 mL/min (ref >60.00)
Glucose, Bld: 93 mg/dL (ref 70–99)
Potassium: 3.8 mEq/L (ref 3.5–5.1)
Sodium: 141 mEq/L (ref 135–145)

## 2023-05-16 LAB — HEMOGLOBIN A1C: Hgb A1c MFr Bld: 6.4 % (ref 4.6–6.5)

## 2023-05-20 NOTE — Progress Notes (Unsigned)
Scott Shaw T. Adams Hinch, MD, CAQ Sports Medicine [facility] [address] [place], [address]  Phone: [phone]  FAX: [phone]  Scott [name] - 82 y.o. male  MRN [id number]  Date of Birth: [birth year]  Date: 05/23/2023  PCP: Scott Beat, MD  Referral: Scott Beat, MD  No chief complaint on file.  Patient Care Team: Scott Beat, MD as PCP - General Scott Ouch, MD as PCP - Cardiology (Cardiology) Scott Peace, MD as Attending Physician (Internal Medicine) Scott [name], [employer] Team 7, MD as Rounding Team (Internal Medicine) Subjective:   Scott [name] is a 82 y.o. pleasant patient who presents with the following:  Preventative Health Maintenance Visit:  Health Maintenance Summary Reviewed and updated, unless pt declines services.  Tobacco History Reviewed. Alcohol: No concerns, no excessive use Exercise Habits: Some activity, rec at least 30 mins 5 times a week STD concerns: no risk or activity to increase risk Drug Use: None  Tdap Shingrix Covid booster  He also has a history of prior stroke. History of prostate cancer in remission.  History of high cholesterol as well as prediabetes. A1c = 6.4    Health Maintenance  Topic Date Due   DTaP/Tdap/Td (1 - Tdap) Never done   Zoster Vaccines- Shingrix (1 of 2) Never done   COVID-19 Vaccine (3 - Pfizer risk series) 11/03/2019   INFLUENZA VACCINE  11/19/2023 (Originally 03/22/2023)   Medicare Annual Wellness (AWV)  05/14/2024   Colonoscopy  06/22/2027   Pneumonia Vaccine 69+ Years old  Completed   HPV VACCINES  Aged Out   Hepatitis C Screening  Discontinued   Immunization History  Administered Date(s) Administered   Fluad Quad(high Dose 65+) 05/25/2022   Influenza Split 06/13/2011   Influenza, High Dose Seasonal PF 05/09/2018, 04/30/2019, 05/26/2021   Influenza,inj,Quad PF,6+ Mos 05/05/2015   Influenza-Unspecified 06/01/2014, 06/21/2017,  06/04/2020   PFIZER(Purple Top)SARS-COV-2 Vaccination 09/05/2019, 10/06/2019   Pneumococcal Conjugate-13 11/26/2013   Pneumococcal Polysaccharide-23 05/05/2015   Respiratory Syncytial Virus Vaccine,Recomb Aduvanted(Arexvy) 07/05/2022   Patient Active Problem List   Diagnosis Date Noted   History of CVA (cerebrovascular accident) without residual deficits 01/03/2020    Priority: High   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) 06/23/2019    Priority: High   Prostate cancer, in remission     Priority: High   Prediabetes 03/22/2022    Priority: Medium    Right bundle branch block     Priority: Medium    AV BLOCK, 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    Priority: Medium    Hyperlipidemia LDL goal <100 04/28/2010    Priority: Medium    ALLERGIC RHINITIS 04/28/2010   GERD 04/28/2010    Past Medical History:  Diagnosis Date   Allergic rhinitis due to pollen    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) 06/23/2019   GERD (gastroesophageal reflux disease)    Hyperlipidemia    Prediabetes 03/22/2022   Prostate cancer (HCC)    cancer    Past Surgical History:  Procedure Laterality Date   APPENDECTOMY     CARPAL TUNNEL RELEASE Bilateral    2023   HERNIA REPAIR  85,03   rt and lt ing hernia   KNEE ARTHROSCOPY  2012   left   LEFT HEART CATH AND CORONARY ANGIOGRAPHY N/A 05/30/2018   Procedure: LEFT HEART CATH AND CORONARY ANGIOGRAPHY;  Surgeon: Scott Ouch, MD;  Location: [facility] INVASIVE CV [facility];  Service: Cardiovascular;  Laterality: N/A;  prostatectomy  1998   ROBOTIC ASSISTED LAPAROSCOPIC CHOLECYSTECTOMY  12/2019   ROTATOR CUFF REPAIR     right   SHOULDER ARTHROSCOPY Right 01/29/2013   Procedure: RIGHT ARTHROSCOPY SHOULDER WITH DEBRIDEMENT, POSSIBLE ROTATOR CUFF REPAIR;  Surgeon: Scott Lewandowsky, MD;  Location: [facility];  Service: Orthopedics;  Laterality: Right;   TONSILLECTOMY     TOTAL HIP ARTHROPLASTY  2007   right     Family  History  Problem Relation Age of Onset   Cancer Father        prostate   Cancer Brother        prostate   Cancer Brother        prostate    Social History   Social History Narrative   Former competitive bodybuilder, Facilities manager, all natural   [employer], [employer], [employer], multiple years    Past Medical History, Surgical History, Social History, Family History, Problem List, Medications, and Allergies have been reviewed and updated if relevant.  Review of Systems: Pertinent positives are listed above.  Otherwise, a full 14 point review of systems has been done in full and it is negative except where it is noted positive.  Objective:   There were no vitals taken for this visit. Ideal Body Weight:    Ideal Body Weight:   No results found.    05/15/2023    8:31 AM 09/13/2022    2:30 PM 03/13/2022    8:17 AM 03/10/2021    2:09 PM 10/17/2018    9:17 AM  Depression screen PHQ 2/9  Decreased Interest 0 0 0 0 0  Down, Depressed, Hopeless 0 0 0 0 0  PHQ - 2 Score 0 0 0 0 0  Altered sleeping    0   Tired, decreased energy    0   Change in appetite    0   Feeling bad or failure about yourself     0   Trouble concentrating    0   Moving slowly or fidgety/restless    0   Suicidal thoughts    0   PHQ-9 Score    0   Difficult doing work/chores    Not difficult at all      GEN: well developed, well nourished, no acute distress Eyes: conjunctiva and lids normal, PERRLA, EOMI ENT: TM clear, nares clear, oral exam WNL Neck: supple, no lymphadenopathy, no thyromegaly, no JVD Pulm: clear to auscultation and percussion, respiratory effort normal CV: regular rate and rhythm, S1-S2, no murmur, rub or gallop, no bruits, peripheral pulses normal and symmetric, no cyanosis, clubbing, edema or varicosities GI: soft, non-tender; no hepatosplenomegaly, masses; active bowel sounds all quadrants GU: deferred Lymph: no cervical, axillary or inguinal adenopathy MSK: gait normal,  muscle tone and strength WNL, no joint swelling, effusions, discoloration, crepitus  SKIN: clear, good turgor, color WNL, no rashes, lesions, or ulcerations Neuro: normal mental status, normal strength, sensation, and motion Psych: alert; oriented to person, place and time, normally interactive and not anxious or depressed in appearance.  All labs reviewed with patient. Results for orders placed or performed in visit on 05/16/23  Hepatic Function Panel  Result Value Ref Range   Total Bilirubin 1.1 0.2 - 1.2 mg/dL   Bilirubin, Direct 0.2 0.0 - 0.3 mg/dL   Alkaline Phosphatase 58 39 - 117 U/L   AST 17 0 - 37 U/L   ALT 22 0 - 53 U/L   Total Protein 6.1 6.0 - 8.3 g/dL  Albumin 3.9 3.5 - 5.2 g/dL  Basic metabolic panel  Result Value Ref Range   Sodium 141 135 - 145 mEq/L   Potassium 3.8 3.5 - 5.1 mEq/L   Chloride 105 96 - 112 mEq/L   CO2 27 19 - 32 mEq/L   Glucose, Bld 93 70 - 99 mg/dL   BUN 24 (H) 6 - 23 mg/dL   Creatinine, Ser 4.27 0.40 - 1.50 mg/dL   GFR 06.23 >76.28 mL/min   Calcium 8.9 8.4 - 10.5 mg/dL  PSA, Medicare  Result Value Ref Range   PSA 0.00 (L) 0.10 - 4.00 ng/ml  CBC with Differential/Platelet  Result Value Ref Range   WBC 11.5 (H) 4.0 - 10.5 K/uL   RBC 4.73 4.22 - 5.81 Mil/uL   Hemoglobin 14.2 13.0 - 17.0 g/dL   HCT 31.5 17.6 - 16.0 %   MCV 91.9 78.0 - 100.0 fl   MCHC 32.6 30.0 - 36.0 g/dL   RDW 73.7 10.6 - 26.9 %   Platelets 205.0 150.0 - 400.0 K/uL   Neutrophils Relative % 80.2 (H) 43.0 - 77.0 %   Lymphocytes Relative 10.6 (L) 12.0 - 46.0 %   Monocytes Relative 8.3 3.0 - 12.0 %   Eosinophils Relative 0.6 0.0 - 5.0 %   Basophils Relative 0.3 0.0 - 3.0 %   Neutro Abs 9.2 (H) 1.4 - 7.7 K/uL   Lymphs Abs 1.2 0.7 - 4.0 K/uL   Monocytes Absolute 0.9 0.1 - 1.0 K/uL   Eosinophils Absolute 0.1 0.0 - 0.7 K/uL   Basophils Absolute 0.0 0.0 - 0.1 K/uL  Lipid panel  Result Value Ref Range   Cholesterol 149 0 - 200 mg/dL   Triglycerides 485.4 0.0 - 149.0 mg/dL    HDL 62.70 >35.00 mg/dL   VLDL 93.8 0.0 - 18.2 mg/dL   LDL Cholesterol 84 0 - 99 mg/dL   Total CHOL/HDL Ratio 4    NonHDL 110.14   Hemoglobin A1c  Result Value Ref Range   Hgb A1c MFr Bld 6.4 4.6 - 6.5 %    Assessment and Plan:     ICD-10-CM   1. Healthcare maintenance  Z00.00       Health Maintenance Exam: The patient's preventative maintenance and recommended screening tests for an annual wellness exam were reviewed in full today. Brought up to date unless services declined.  Counselled on the importance of diet, exercise, and its role in overall health and mortality. The patient's FH and SH was reviewed, including their home life, tobacco status, and drug and alcohol status.  Follow-up in 1 year for physical exam or additional follow-up below.  Disposition: No follow-ups on file.  No orders of the defined types were placed in this encounter.  There are no discontinued medications. No orders of the defined types were placed in this encounter.   Signed,  Elpidio Galea. Jacquelynn Friend, MD   Allergies as of 05/23/2023   No Known Allergies      Medication List        Accurate as of May 20, 2023  1:57 PM. If you have any questions, ask your nurse or doctor.          acetaminophen 325 MG tablet Commonly known as: TYLENOL Take 2 tablets (650 mg total) by mouth every 6 (six) hours as needed for mild pain (or temp > 37.5 C (99.5 F)).   aspirin 81 MG tablet Take 1 tablet (81 mg total) by mouth daily. Take aspirin 81 mg once daily by  mouth for 3 weeks(21 days) and then stop   atorvastatin 40 MG tablet Commonly known as: LIPITOR TAKE 1 TABLET BY MOUTH DAILY AT 6PM   celecoxib 200 MG capsule Commonly known as: CELEBREX TAKE 1 CAPSULE BY MOUTH TWICE DAILY.   cyanocobalamin 2000 MCG tablet Take 2,000 mcg by mouth daily.   fluocinonide cream 0.05 % Commonly known as: LIDEX Apply 1 Application topically 2 (two) times daily.   ketoconazole 2 % cream Commonly known  as: NIZORAL Apply one application twice daily for two weeks   mometasone 0.1 % cream Commonly known as: ELOCON Apply 1 Application topically daily.   pantoprazole 40 MG tablet Commonly known as: PROTONIX Take 1 tablet (40 mg total) by mouth 2 (two) times daily.   RED YEAST RICE PO Take 900 mg by mouth every morning.   triamcinolone cream 0.1 % Commonly known as: KENALOG APPLY TO AFFECTED AREAS TOPICALLY TWICE DAILY AS DIRECTED   vitamin E 180 MG (400 UNITS) capsule Take 400 Units by mouth daily.

## 2023-05-23 ENCOUNTER — Ambulatory Visit (INDEPENDENT_AMBULATORY_CARE_PROVIDER_SITE_OTHER): Payer: Medicare HMO | Admitting: Family Medicine

## 2023-05-23 ENCOUNTER — Encounter: Payer: Self-pay | Admitting: Family Medicine

## 2023-05-23 VITALS — BP 124/82 | HR 76 | Temp 98.1°F | Ht 67.5 in | Wt 163.1 lb

## 2023-05-23 DIAGNOSIS — Z0000 Encounter for general adult medical examination without abnormal findings: Secondary | ICD-10-CM

## 2023-05-23 DIAGNOSIS — Z23 Encounter for immunization: Secondary | ICD-10-CM

## 2023-05-31 ENCOUNTER — Other Ambulatory Visit: Payer: Self-pay | Admitting: Family Medicine

## 2023-06-06 DIAGNOSIS — M62830 Muscle spasm of back: Secondary | ICD-10-CM | POA: Diagnosis not present

## 2023-06-06 DIAGNOSIS — M25551 Pain in right hip: Secondary | ICD-10-CM | POA: Diagnosis not present

## 2023-06-06 DIAGNOSIS — W57XXXS Bitten or stung by nonvenomous insect and other nonvenomous arthropods, sequela: Secondary | ICD-10-CM | POA: Diagnosis not present

## 2023-06-06 DIAGNOSIS — M67912 Unspecified disorder of synovium and tendon, left shoulder: Secondary | ICD-10-CM | POA: Diagnosis not present

## 2023-06-06 DIAGNOSIS — M791 Myalgia, unspecified site: Secondary | ICD-10-CM | POA: Diagnosis not present

## 2023-06-06 DIAGNOSIS — W57XXXA Bitten or stung by nonvenomous insect and other nonvenomous arthropods, initial encounter: Secondary | ICD-10-CM | POA: Diagnosis not present

## 2023-06-06 DIAGNOSIS — R21 Rash and other nonspecific skin eruption: Secondary | ICD-10-CM | POA: Diagnosis not present

## 2023-06-09 DIAGNOSIS — M25551 Pain in right hip: Secondary | ICD-10-CM | POA: Diagnosis not present

## 2023-06-19 DIAGNOSIS — M25551 Pain in right hip: Secondary | ICD-10-CM | POA: Diagnosis not present

## 2023-06-21 DIAGNOSIS — M19031 Primary osteoarthritis, right wrist: Secondary | ICD-10-CM | POA: Diagnosis not present

## 2023-07-30 ENCOUNTER — Other Ambulatory Visit: Payer: Self-pay | Admitting: Family Medicine

## 2023-08-27 DIAGNOSIS — L111 Transient acantholytic dermatosis [Grover]: Secondary | ICD-10-CM | POA: Diagnosis not present

## 2023-08-27 DIAGNOSIS — Z85828 Personal history of other malignant neoplasm of skin: Secondary | ICD-10-CM | POA: Diagnosis not present

## 2023-08-27 DIAGNOSIS — L308 Other specified dermatitis: Secondary | ICD-10-CM | POA: Diagnosis not present

## 2023-08-27 DIAGNOSIS — B356 Tinea cruris: Secondary | ICD-10-CM | POA: Diagnosis not present

## 2023-09-11 ENCOUNTER — Other Ambulatory Visit: Payer: Self-pay | Admitting: Family Medicine

## 2023-09-11 NOTE — Telephone Encounter (Signed)
Last office visit 05/23/2023 for CPE.  Last refilled 04/27/2023 for #60 with 2 refills.  Next Appt: No future appointments.

## 2023-10-09 ENCOUNTER — Ambulatory Visit: Payer: Self-pay | Admitting: Family Medicine

## 2023-10-09 DIAGNOSIS — L2989 Other pruritus: Secondary | ICD-10-CM | POA: Diagnosis not present

## 2023-10-09 DIAGNOSIS — L909 Atrophic disorder of skin, unspecified: Secondary | ICD-10-CM | POA: Diagnosis not present

## 2023-10-09 DIAGNOSIS — L820 Inflamed seborrheic keratosis: Secondary | ICD-10-CM | POA: Diagnosis not present

## 2023-10-09 DIAGNOSIS — Z85828 Personal history of other malignant neoplasm of skin: Secondary | ICD-10-CM | POA: Diagnosis not present

## 2023-10-09 DIAGNOSIS — L308 Other specified dermatitis: Secondary | ICD-10-CM | POA: Diagnosis not present

## 2023-10-09 NOTE — Telephone Encounter (Signed)
  Chief Complaint: dark stools Symptoms: dark stool Frequency: approx 1 week Pertinent Negatives: Patient denies blood thinner, abdominal pain Disposition: [] ED /[] Urgent Care (no appt availability in office) / [x] Appointment(In office/virtual)/ []  [facility]/ [] Home Care/ [] Refused Recommended Disposition /[] [place] [facility]/ []  Follow-up with PCP Additional Notes: Patient calls reporting dark stool for approx 1 week. Patient reports he has been constipated, did take pepto bismol and a laxative 2/16, but feels his stools were dark prior to that. Per protocol, patient to be evaluated within 3 days. First available appointment with PCP 10/10/23 @ 0920, patient scheduled. Care advice reviewed, patient verbalized understandingand denies further questions at this time. Alerting PCP for review.    Copied from CRM [id number]. Topic: Clinical - Red Word Triage >> Oct 09, 2023  8:50 AM Clayton Bibles wrote: Red Word that prompted transfer to Nurse Triage: His stool has been black for the last week; no pain; no fever Reason for Disposition  [1] Abnormal color is unexplained AND [2] persists > 24 hours  Answer Assessment - Initial Assessment Questions 1. COLOR: "What color is it?" "Is that color in part or all of the stool?"     Black stools for approx 1 week. 2. ONSET: "When was the unusual color first noted?"     Approx 1 week 3. CAUSE: "Have you eaten any food or taken any medicine of this color?" Note: See listing in Background Information section.      Unsure that he ate anything that would change it, did take a laxative after noticing, states he is constipated. LBM, small this morning. 4. OTHER SYMPTOMS: "Do you have any other symptoms?" (e.g., abdomen pain, diarrhea, jaundice, fever).     Denies  States he did take pepto bismol a few days ago but thinks that they were dark prior to that.  Protocols used: Stools - Unusual Color-A-AH

## 2023-10-10 ENCOUNTER — Telehealth (INDEPENDENT_AMBULATORY_CARE_PROVIDER_SITE_OTHER): Payer: Medicare HMO | Admitting: Family Medicine

## 2023-10-10 ENCOUNTER — Encounter: Payer: Self-pay | Admitting: Family Medicine

## 2023-10-10 VITALS — Ht 67.5 in | Wt 162.0 lb

## 2023-10-10 DIAGNOSIS — R195 Other fecal abnormalities: Secondary | ICD-10-CM

## 2023-10-10 NOTE — Progress Notes (Addendum)
   Scott Blue T. Ramelo Oetken, MD, CAQ Sports Medicine [facility] [address] [place], [address]  Phone: [phone]  FAX: [phone]  Scott [name] - 83 y.o. male  MRN [id number]  Date of Birth: [birth year]  Date: 10/10/2023  PCP: Hannah Beat, MD  Referral: Hannah Beat, MD  Chief Complaint  Patient presents with   dark stool    Patient states stools have been dark about a week. Has not noticed any blood in stool, toilet or when he wipes. Thinks it is due to taking pepto bismol    I connected with  Scott [name] on 10/10/2023  9:20 AM EST by telephone and verified that I am speaking with the correct person using two identifiers.   I attempted to make a video visit with the patient, but after talking to him, he does not have capability to have a video visit.  I converted this to a phone visit. Location patient: home phone or cell phone Location provider: work or home office Consent: Verbal consent directly obtained from Scott [name] and that there may be a patient responsible charge related to this service. Persons participating in the virtual visit: patient, provider  I discussed the limitations of evaluation and management by telemedicine and the availability of in person appointments.  The patient expressed understanding and agreed to proceed.     History of Present Illness:  [name] is a very nice patient who I have known for many years.  For about the last week he has noticed some significantly dark stools.  Around this time, he did have some indigestion after eating and has been taking some Pepto-Bismol.  He thinks that the start of his dark stool coincides with this.  He also does take a baby aspirin every day as well as Celebrex.  He is not having any abdominal pain.  Review of Systems: pertinent positives and pertinent negatives as per HPI No acute distress verbally   Observations/Objective/Exam:  An attempt was  made to discern vital signs over the phone and per patient if applicable and possible.   Neurological:     Mental Status: pleasant and appropriate   Psychiatric:        Thought Content: Thought content normal.      Assessment and Plan:    ICD-10-CM   1. Dark stools  R19.5      I am going to have him discontinue Pepto-Bismol, and this may be the source of his dark stools alone.  We are also going to stop his Celebrex for at least 2 weeks, and for the next 10 days he will also abstain from his baby aspirin.  We talked about potential risks with bright red blood per rectum and need for urgent evaluation if it appears as if he is developing a GI bleed.  I discussed the assessment and treatment plan with the patient. The patient was provided an opportunity to ask questions and all were answered. The patient agreed with the plan and demonstrated an understanding of the instructions.   The patient was advised to call back or seek an in-person evaluation if the symptoms worsen or if the condition fails to improve as anticipated.  Follow-up: prn unless noted otherwise below No follow-ups on file.  No orders of the defined types were placed in this encounter.  No orders of the defined types were placed in this encounter.   Signed,  Elpidio Galea. Ori Trejos, MD

## 2023-10-10 NOTE — Addendum Note (Signed)
Addended by: Hannah Beat on: 10/10/2023 06:50 PM   Modules accepted: Level of Service

## 2023-10-31 DIAGNOSIS — L309 Dermatitis, unspecified: Secondary | ICD-10-CM | POA: Diagnosis not present

## 2023-10-31 DIAGNOSIS — L209 Atopic dermatitis, unspecified: Secondary | ICD-10-CM | POA: Diagnosis not present

## 2023-11-28 DIAGNOSIS — T1510XS Foreign body in conjunctival sac, unspecified eye, sequela: Secondary | ICD-10-CM | POA: Diagnosis not present

## 2023-11-28 DIAGNOSIS — H5710 Ocular pain, unspecified eye: Secondary | ICD-10-CM | POA: Diagnosis not present

## 2023-12-19 ENCOUNTER — Other Ambulatory Visit: Payer: Self-pay | Admitting: Family Medicine

## 2023-12-19 NOTE — Telephone Encounter (Signed)
 Copied from CRM [id number]. Topic: Clinical - Prescription Issue >> Dec 19, 2023  8:25 AM Bambi Bonine D wrote: Reason for CRM: Patient stated that his medication for the pantoprazole  (PROTONIX ) 40 MG tablet is incorrect. Patient stated that when he pick it up from the pharmacy the prescription says one a day but he is supposed to take the medication twice a day and would like to have that corrected.

## 2023-12-19 NOTE — Telephone Encounter (Signed)
 Corrected Rx with bid dosing was sent to [facility] earlier this morning.

## 2023-12-25 DIAGNOSIS — Z85828 Personal history of other malignant neoplasm of skin: Secondary | ICD-10-CM | POA: Diagnosis not present

## 2023-12-25 DIAGNOSIS — L120 Bullous pemphigoid: Secondary | ICD-10-CM | POA: Diagnosis not present

## 2023-12-25 DIAGNOSIS — L309 Dermatitis, unspecified: Secondary | ICD-10-CM | POA: Diagnosis not present

## 2024-01-29 DIAGNOSIS — Z85828 Personal history of other malignant neoplasm of skin: Secondary | ICD-10-CM | POA: Diagnosis not present

## 2024-01-29 DIAGNOSIS — L2089 Other atopic dermatitis: Secondary | ICD-10-CM | POA: Diagnosis not present

## 2024-02-01 ENCOUNTER — Other Ambulatory Visit: Payer: Self-pay | Admitting: Family Medicine

## 2024-02-01 NOTE — Telephone Encounter (Signed)
 Last office visit 10/10/2023 for dark stools.  Last refilled 09/11/2023 for #60 with 2 refills.  Next Appt: No future appointments.

## 2024-02-28 DIAGNOSIS — Z85828 Personal history of other malignant neoplasm of skin: Secondary | ICD-10-CM | POA: Diagnosis not present

## 2024-02-28 DIAGNOSIS — L570 Actinic keratosis: Secondary | ICD-10-CM | POA: Diagnosis not present

## 2024-02-28 DIAGNOSIS — L304 Erythema intertrigo: Secondary | ICD-10-CM | POA: Diagnosis not present

## 2024-04-22 ENCOUNTER — Ambulatory Visit (INDEPENDENT_AMBULATORY_CARE_PROVIDER_SITE_OTHER)

## 2024-04-22 VITALS — BP 124/82 | Ht 67.5 in | Wt 165.0 lb

## 2024-04-22 DIAGNOSIS — Z0000 Encounter for general adult medical examination without abnormal findings: Secondary | ICD-10-CM

## 2024-04-22 NOTE — Patient Instructions (Signed)
 Mr. Scott [name] , Thank you for taking time out of your busy schedule to complete your Annual Wellness Visit with me. I enjoyed our conversation and look forward to speaking with you again next year. I, as well as your care team,  appreciate your ongoing commitment to your health goals. Please review the following plan we discussed and let me know if I can assist you in the future. Your Game plan/ To Do List    Referrals: If you haven't heard from the office you've been referred to, please reach out to them at the phone provided.   Follow up Visits: We will see or speak with you next year for your Next Medicare AWV with our clinical staff Have you seen your provider in the last 6 months (3 months if uncontrolled diabetes)? No  Clinician Recommendations:  Aim for 30 minutes of exercise or brisk walking, 6-8 glasses of water, and 5 servings of fruits and vegetables each day.       This is a list of the screenings recommended for you:  Health Maintenance  Topic Date Due   DTaP/Tdap/Td vaccine (1 - Tdap) Never done   Zoster (Shingles) Vaccine (1 of 2) Never done   COVID-19 Vaccine (3 - Pfizer risk series) 11/03/2019   Flu Shot  03/21/2024   Medicare Annual Wellness Visit  04/22/2025   Colon Cancer Screening  06/22/2027   Pneumococcal Vaccine for age over 44  Completed   HPV Vaccine  Aged Out   Meningitis B Vaccine  Aged Out   Hepatitis C Screening  Discontinued    Advanced directives: (Copy Requested) Please bring a copy of your health care power of attorney and living will to the office to be added to your chart at your convenience. You can mail to [facility] [address] [place], [place] [address] or email to [email or web address] Advance Care Planning is important because it:  [x]  Makes sure you receive the medical care that is consistent with your values, goals, and preferences  [x]  It provides guidance to your family and loved ones and reduces their decisional  burden about whether or not they are making the right decisions based on your wishes.  Follow the link provided in your after visit summary or read over the paperwork we have mailed to you to help you started getting your Advance Directives in place. If you need assistance in completing these, please reach out to us  so that we can help you!  See attachments for Preventive Care and Fall Prevention Tips.

## 2024-04-22 NOTE — Progress Notes (Signed)
 Because this visit was a virtual/telehealth visit,  certain criteria was not obtained, such a blood pressure, CBG if applicable, and timed get up and go. Any medications not marked as taking were not mentioned during the medication reconciliation part of the visit. Any vitals not documented were not able to be obtained due to this being a telehealth visit or patient was unable to self-report a recent blood pressure reading due to a lack of equipment at home via telehealth. Vitals that have been documented are verbally provided by the patient.  This visit was performed by a medical professional under my direct supervision. I was immediately available for consultation/collaboration. I have reviewed and agree with the Annual Wellness Visit documentation.  Subjective:   Scott [name] is a 83 y.o. who presents for a Medicare Wellness preventive visit.  As a reminder, Annual Wellness Visits don't include a physical exam, and some assessments may be limited, especially if this visit is performed virtually. We may recommend an in-person follow-up visit with your provider if needed.  Visit Complete: Virtual I connected with  Scott [name] on 04/22/24 by a audio enabled telemedicine application and verified that I am speaking with the correct person using two identifiers.  Patient Location: Home  Provider Location: Home Office  I discussed the limitations of evaluation and management by telemedicine. The patient expressed understanding and agreed to proceed.  Vital Signs: Because this visit was a virtual/telehealth visit, some criteria may be missing or patient reported. Any vitals not documented were not able to be obtained and vitals that have been documented are patient reported.  VideoDeclined- This patient declined Librarian, academic. Therefore the visit was completed with audio only.  Persons Participating in Visit: Patient.  AWV Questionnaire: No: Patient  Medicare AWV questionnaire was not completed prior to this visit.  Cardiac Risk Factors include: advanced age (>75men, >56 women);male gender;dyslipidemia     Objective:    Today's Vitals   04/22/24 0821  BP: 124/82  Weight: 165 lb (74.8 kg)  Height: 5' 7.5 (1.715 m)   Body mass index is 25.46 kg/m.     04/22/2024    8:25 AM 05/15/2023    8:34 AM 03/13/2022    8:16 AM 03/10/2021    2:08 PM 01/05/2020    9:01 AM 01/03/2020    4:23 PM 01/02/2020    8:07 PM  Advanced Directives  Does Patient Have a Medical Advance Directive? Yes Yes Yes No No No No  Type of Estate agent of [place];Living will Healthcare Power of [place];Living will Healthcare Power of [place];Living will      Does patient want to make changes to medical advance directive? No - Patient declined   No - Patient declined     Copy of Healthcare Power of Attorney in Chart? No - copy requested No - copy requested No - copy requested      Would patient like information on creating a medical advance directive?     No - Patient declined No - Patient declined No - Patient declined    Current Medications (verified) Outpatient Encounter Medications as of 04/22/2024  Medication Sig   acetaminophen  (TYLENOL ) 325 MG tablet Take 2 tablets (650 mg total) by mouth every 6 (six) hours as needed for mild pain (or temp > 37.5 C (99.5 F)).   aspirin  81 MG tablet Take 1 tablet (81 mg total) by mouth daily. Take aspirin  81 mg once daily by mouth for 3 weeks(21 days)  and then stop   atorvastatin  (LIPITOR) 40 MG tablet TAKE 1 TABLET BY MOUTH DAILY AT 6PM   celecoxib (CELEBREX) 200 MG capsule TAKE 1 CAPSULE BY MOUTH TWICE DAILY.   cyanocobalamin 2000 MCG tablet Take 2,000 mcg by mouth daily.   fluocinonide cream (LIDEX) 0.05 % Apply 1 Application topically 2 (two) times daily.   ketoconazole  (NIZORAL ) 2 % cream Apply one application twice daily for two weeks   pantoprazole  (PROTONIX ) 40 MG tablet Take 1 tablet (40 mg  total) by mouth 2 (two) times daily.   Red Yeast Rice Extract (RED YEAST RICE PO) Take 900 mg by mouth every morning.   vitamin E 180 MG (400 UNITS) capsule Take 400 Units by mouth daily.   No facility-administered encounter medications on file as of 04/22/2024.    Allergies (verified) Patient has no known allergies.   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   GERD (gastroesophageal reflux disease)    Hyperlipidemia    Prediabetes 03/22/2022   Prostate cancer [facility])    cancer   Past Surgical History:  Procedure Laterality Date   APPENDECTOMY     CARPAL TUNNEL RELEASE Bilateral    2023   HERNIA REPAIR  85,03   rt and lt ing hernia   KNEE ARTHROSCOPY  2012   left   LEFT HEART CATH AND CORONARY ANGIOGRAPHY N/A 05/30/2018   Procedure: LEFT HEART CATH AND CORONARY ANGIOGRAPHY;  Surgeon: Darron Deatrice LABOR, MD;  Location: [facility] INVASIVE CV LAB;  Service: Cardiovascular;  Laterality: N/A;   prostatectomy  1998   ROBOTIC ASSISTED LAPAROSCOPIC CHOLECYSTECTOMY  12/2019   ROTATOR CUFF REPAIR     right   SHOULDER ARTHROSCOPY Right 01/29/2013   Procedure: RIGHT ARTHROSCOPY SHOULDER WITH DEBRIDEMENT, POSSIBLE ROTATOR CUFF REPAIR;  Surgeon: Dempsey JINNY Sensor, MD;  Location: [facility];  Service: Orthopedics;  Laterality: Right;   TONSILLECTOMY     TOTAL HIP ARTHROPLASTY  2007   right    Family History  Problem Relation Age of Onset   Cancer Father        prostate   Cancer Brother        prostate   Cancer Brother        prostate   Social History   Socioeconomic History   Marital status: Married    Spouse name: Not on file   Number of children: Not on file   Years of education: Not on file   Highest education level: Not on file  Occupational History   Occupation: auctioneer    Employer: RETIRED  Tobacco Use   Smoking status: Never   Smokeless tobacco:  Never  Vaping Use   Vaping status: Never Used  Substance and Sexual Activity   Alcohol use: No   Drug use: No   Sexual activity: Not on file  Other Topics Concern   Not on file  Social History Narrative   Former competitive bodybuilder, Facilities manager, all natural   [employer], [place], [employer], multiple years   Social Drivers of Corporate investment banker Strain: Low Risk  (04/22/2024)   Overall Financial Resource Strain (CARDIA)    Difficulty of Paying Living Expenses: Not hard at all  Food Insecurity: No Food Insecurity (04/22/2024)   Hunger Vital Sign    Worried About Running Out of Food in the Last Year: Never true  Ran Out of Food in the Last Year: Never true  Transportation Needs: No Transportation Needs (04/22/2024)   PRAPARE - Administrator, Civil Service (Medical): No    Lack of Transportation (Non-Medical): No  Physical Activity: Insufficiently Active (04/22/2024)   Exercise Vital Sign    Days of Exercise per Week: 4 days    Minutes of Exercise per Session: 30 min  Stress: No Stress Concern Present (04/22/2024)   [employer] of Occupational Health - Occupational Stress Questionnaire    Feeling of Stress: Not at all  Social Connections: Socially Integrated (04/22/2024)   Social Connection and Isolation Panel    Frequency of Communication with Friends and Family: More than three times a week    Frequency of Social Gatherings with Friends and Family: More than three times a week    Attends Religious Services: More than 4 times per year    Active Member of [employer] or Organizations: Yes    Attends Engineer, structural: More than 4 times per year    Marital Status: Married    Tobacco Counseling Counseling given: Not Answered    Clinical Intake:  Pre-visit preparation completed: Yes  Pain : No/denies pain     BMI - recorded: 25.46 Nutritional Status: BMI 25 -29 Overweight Nutritional Risks: None Diabetes: No  Lab Results   Component Value Date   HGBA1C 6.4 05/16/2023   HGBA1C 6.4 03/15/2022   HGBA1C 6.3 03/07/2021     How often do you need to have someone help you when you read instructions, pamphlets, or other written materials from your doctor or pharmacy?: 1 - Never  Interpreter Needed?: No  Information entered by :: Creighton Longley,CMA   Activities of Daily Living     04/22/2024    8:24 AM 05/15/2023    8:32 AM  In your present state of health, do you have any difficulty performing the following activities:  Hearing? 0 0  Vision? 0 0  Difficulty concentrating or making decisions? 0 0  Walking or climbing stairs? 0 0  Dressing or bathing? 0 0  Doing errands, shopping? 0 0  Preparing Food and eating ? N N  Using the Toilet? N N  In the past six months, have you accidently leaked urine? N N  Do you have problems with loss of bowel control? N N  Managing your Medications? N N  Managing your Finances? N N  Housekeeping or managing your Housekeeping? N N    Patient Care Team: Watt Mirza, MD as PCP - General Darron Deatrice LABOR, MD as PCP - Cardiology (Cardiology) Briana Joya BIRCH, MD as Attending Physician (Internal Medicine) [place], [employer] Team 7, MD as Rounding Team (Internal Medicine)  I have updated your Care Teams any recent Medical Services you may have received from other providers in the past year.     Assessment:   This is a routine wellness examination for Scott [name].  Hearing/Vision screen Hearing Screening - Comments:: No difficulties Vision Screening - Comments:: No difficulties   Goals Addressed               This Visit's Progress     Stay Healthy (pt-stated)   On track      Depression Screen     04/22/2024    8:26 AM 05/15/2023    8:31 AM 09/13/2022    2:30 PM 03/13/2022    8:17 AM 03/10/2021    2:09 PM 10/17/2018    9:17 AM 09/10/2017  3:04 PM  PHQ 2/9 Scores  PHQ - 2 Score 0 0 0 0 0 0 2  PHQ- 9 Score 0    0  5    Fall Risk     04/22/2024    8:25 AM  05/15/2023    8:33 AM 09/13/2022    2:29 PM 03/13/2022    8:17 AM 03/10/2021    2:09 PM  Fall Risk   Falls in the past year? 0 0 0 0 0  Number falls in past yr: 0 0 0 0 0  Injury with Fall? 0 0 0 0 0  Risk for fall due to : No Fall Risks No Fall Risks No Fall Risks Medication side effect Medication side effect  Follow up Falls evaluation completed Falls prevention discussed Falls evaluation completed  Falls evaluation completed;Education provided;Falls prevention discussed  Falls evaluation completed;Falls prevention discussed      Data saved with a previous flowsheet row definition    MEDICARE RISK AT HOME:  Medicare Risk at Home Any stairs in or around the home?: Yes If so, are there any without handrails?: No Home free of loose throw rugs in walkways, pet beds, electrical cords, etc?: Yes Adequate lighting in your home to reduce risk of falls?: Yes Life alert?: No Use of a cane, walker or w/c?: No Grab bars in the bathroom?: No Shower chair or bench in shower?: No Elevated toilet seat or a handicapped toilet?: No  TIMED UP AND GO:  Was the test performed?  No  Cognitive Function: 6CIT completed    03/10/2021    2:10 PM 09/10/2017    3:06 PM  MMSE - Mini Mental State Exam  Not completed: Unable to complete   Orientation to time  5   Orientation to Place  5   Registration  3   Attention/ Calculation  0   Recall  3   Language- name 2 objects  0   Language- repeat  1  Language- follow 3 step command  3   Language- read & follow direction  0   Write a sentence  0   Copy design  0   Total score  20      Data saved with a previous flowsheet row definition        04/22/2024    8:23 AM 05/15/2023    8:35 AM 03/13/2022    8:18 AM  6CIT Screen  What Year? 0 points 0 points 0 points  What month? 0 points 0 points 0 points  What time? 0 points 0 points 0 points  Count back from 20 0 points 0 points 0 points  Months in reverse 0 points 0 points 4 points  Repeat phrase 0  points 0 points 10 points  Total Score 0 points 0 points 14 points    Immunizations Immunization History  Administered Date(s) Administered   Fluad Quad(high Dose 65+) 05/25/2022   Fluad Trivalent(High Dose 65+) 05/23/2023   INFLUENZA, HIGH DOSE SEASONAL PF 05/09/2018, 04/30/2019, 05/26/2021   Influenza Split 06/13/2011   Influenza,inj,Quad PF,6+ Mos 05/05/2015   Influenza-Unspecified 06/01/2014, 06/21/2017, 06/04/2020   PFIZER(Purple Top)SARS-COV-2 Vaccination 09/05/2019, 10/06/2019   Pneumococcal Conjugate-13 11/26/2013   Pneumococcal Polysaccharide-23 05/05/2015   Respiratory Syncytial Virus Vaccine,Recomb Aduvanted(Arexvy) 07/05/2022    Screening Tests Health Maintenance  Topic Date Due   DTaP/Tdap/Td (1 - Tdap) Never done   Zoster Vaccines- Shingrix (1 of 2) Never done   COVID-19 Vaccine (3 - Pfizer risk series) 11/03/2019   INFLUENZA  VACCINE  03/21/2024   Medicare Annual Wellness (AWV)  05/14/2024   Colonoscopy  06/22/2027   Pneumococcal Vaccine: 50+ Years  Completed   HPV VACCINES  Aged Out   Meningococcal B Vaccine  Aged Out   Hepatitis C Screening  Discontinued    Health Maintenance  Health Maintenance Due  Topic Date Due   DTaP/Tdap/Td (1 - Tdap) Never done   Zoster Vaccines- Shingrix (1 of 2) Never done   COVID-19 Vaccine (3 - Pfizer risk series) 11/03/2019   INFLUENZA VACCINE  03/21/2024   Medicare Annual Wellness (AWV)  05/14/2024   Health Maintenance Items Addressed: Patient declined   Additional Screening:  Vision Screening: Recommended annual ophthalmology exams for early detection of glaucoma and other disorders of the eye. Would you like a referral to an eye doctor? No    Dental Screening: Recommended annual dental exams for proper oral hygiene  Community Resource Referral / Chronic Care Management: CRR required this visit?  No   CCM required this visit?  No   Plan:    I have personally reviewed and noted the following in the patient's  chart:   Medical and social history Use of alcohol, tobacco or illicit drugs  Current medications and supplements including opioid prescriptions. Patient is not currently taking opioid prescriptions. Functional ability and status Nutritional status Physical activity Advanced directives List of other physicians Hospitalizations, surgeries, and ER visits in previous 12 months Vitals Screenings to include cognitive, depression, and falls Referrals and appointments  In addition, I have reviewed and discussed with patient certain preventive protocols, quality metrics, and best practice recommendations. A written personalized care plan for preventive services as well as general preventive health recommendations were provided to patient.   Lyle MARLA Right, [place]   04/22/2024   After Visit Summary: (MyChart) Due to this being a telephonic visit, the after visit summary with patients personalized plan was offered to patient via MyChart   Notes: Nothing significant to report at this time.

## 2024-05-13 ENCOUNTER — Other Ambulatory Visit: Payer: Self-pay | Admitting: Family Medicine

## 2024-05-13 DIAGNOSIS — R7303 Prediabetes: Secondary | ICD-10-CM

## 2024-05-13 DIAGNOSIS — E785 Hyperlipidemia, unspecified: Secondary | ICD-10-CM

## 2024-05-13 DIAGNOSIS — Z79899 Other long term (current) drug therapy: Secondary | ICD-10-CM

## 2024-05-13 DIAGNOSIS — C61 Malignant neoplasm of prostate: Secondary | ICD-10-CM

## 2024-05-13 NOTE — Telephone Encounter (Signed)
 Please schedule CPE with fasting labs prior with Dr. Watt after 05/22/2024

## 2024-05-19 DIAGNOSIS — Z85828 Personal history of other malignant neoplasm of skin: Secondary | ICD-10-CM | POA: Diagnosis not present

## 2024-05-19 DIAGNOSIS — L2089 Other atopic dermatitis: Secondary | ICD-10-CM | POA: Diagnosis not present

## 2024-05-28 ENCOUNTER — Other Ambulatory Visit (INDEPENDENT_AMBULATORY_CARE_PROVIDER_SITE_OTHER)

## 2024-05-28 DIAGNOSIS — C61 Malignant neoplasm of prostate: Secondary | ICD-10-CM

## 2024-05-28 DIAGNOSIS — R7303 Prediabetes: Secondary | ICD-10-CM

## 2024-05-28 DIAGNOSIS — E785 Hyperlipidemia, unspecified: Secondary | ICD-10-CM | POA: Diagnosis not present

## 2024-05-28 DIAGNOSIS — Z79899 Other long term (current) drug therapy: Secondary | ICD-10-CM | POA: Diagnosis not present

## 2024-05-28 LAB — LIPID PANEL
Cholesterol: 114 mg/dL (ref 0–200)
HDL: 38.5 mg/dL — ABNORMAL LOW (ref >39.00)
LDL Cholesterol: 63 mg/dL (ref 0–99)
NonHDL: 75.45
Total CHOL/HDL Ratio: 3
Triglycerides: 63 mg/dL (ref 0.0–149.0)
VLDL: 12.6 mg/dL (ref 0.0–40.0)

## 2024-05-28 LAB — BASIC METABOLIC PANEL WITH GFR
BUN: 24 mg/dL — ABNORMAL HIGH (ref 6–23)
CO2: 28 meq/L (ref 19–32)
Calcium: 9.6 mg/dL (ref 8.4–10.5)
Chloride: 105 meq/L (ref 96–112)
Creatinine, Ser: 1.02 mg/dL (ref 0.40–1.50)
GFR: 69.07 mL/min (ref >60.00)
Glucose, Bld: 106 mg/dL — ABNORMAL HIGH (ref 70–99)
Potassium: 4.8 meq/L (ref 3.5–5.1)
Sodium: 139 meq/L (ref 135–145)

## 2024-05-28 LAB — CBC WITH DIFFERENTIAL/PLATELET
Basophils Absolute: 0 K/uL (ref 0.0–0.1)
Basophils Relative: 0.4 % (ref 0.0–3.0)
Eosinophils Absolute: 0.1 K/uL (ref 0.0–0.7)
Eosinophils Relative: 2.9 % (ref 0.0–5.0)
HCT: 45.5 % (ref 39.0–52.0)
Hemoglobin: 14.8 g/dL (ref 13.0–17.0)
Lymphocytes Relative: 20.2 % (ref 12.0–46.0)
Lymphs Abs: 1 K/uL (ref 0.7–4.0)
MCHC: 32.6 g/dL (ref 30.0–36.0)
MCV: 91.1 fl (ref 78.0–100.0)
Monocytes Absolute: 0.5 K/uL (ref 0.1–1.0)
Monocytes Relative: 11 % (ref 3.0–12.0)
Neutro Abs: 3.1 K/uL (ref 1.4–7.7)
Neutrophils Relative %: 65.5 % (ref 43.0–77.0)
Platelets: 204 K/uL (ref 150.0–400.0)
RBC: 5 Mil/uL (ref 4.22–5.81)
RDW: 14.5 % (ref 11.5–15.5)
WBC: 4.8 K/uL (ref 4.0–10.5)

## 2024-05-28 LAB — HEPATIC FUNCTION PANEL
ALT: 20 U/L (ref 0–53)
AST: 23 U/L (ref 0–37)
Albumin: 4.4 g/dL (ref 3.5–5.2)
Alkaline Phosphatase: 67 U/L (ref 39–117)
Bilirubin, Direct: 0.2 mg/dL (ref 0.0–0.3)
Total Bilirubin: 1.1 mg/dL (ref 0.2–1.2)
Total Protein: 6.5 g/dL (ref 6.0–8.3)

## 2024-05-28 LAB — HEMOGLOBIN A1C: Hgb A1c MFr Bld: 6.4 % (ref 4.6–6.5)

## 2024-05-28 LAB — PSA, MEDICARE: PSA: 0.02 ng/mL — ABNORMAL LOW (ref 0.10–4.00)

## 2024-05-31 NOTE — Progress Notes (Unsigned)
 Scott Mahar T. Coyle Stordahl, MD, CAQ Sports Medicine [facility] [address] [place], [address]  Phone: [phone]  FAX: [phone]  Scott [name] - 83 y.o. male  MRN [id number]  Date of Birth: Jan 21, 1941  Date: 06/02/2024  PCP: Watt Mirza, MD  Referral: Watt Mirza, MD  No chief complaint on file.  Patient Care Team: Watt Mirza, MD as PCP - General Darron Deatrice LABOR, MD as PCP - Cardiology (Cardiology) Briana Joya BIRCH, MD as Attending Physician (Internal Medicine) Bobbette, [employer] Team 7, MD as Rounding Team (Internal Medicine) Subjective:   Scott [name] is a 83 y.o. pleasant patient who presents with the following:  Discussed the use of AI scribe software for clinical note transcription with the patient, who gave verbal consent to proceed.  History of Present Illness     Preventative Health Maintenance Visit:  Health Maintenance Summary Reviewed and updated, unless pt declines services.  Tobacco History Reviewed. Alcohol: No concerns, no excessive use Exercise Habits: Some activity, rec at least 30 mins 5 times a week STD concerns: no risk or activity to increase risk Drug Use: None  Tdap Shingrix COVID booster Flu shot  He is generally healthy 83 year old gentleman, but he does have a history of prior stroke as well as prior prostate cancer.  His activity level is very high.  Lipitor 40 mg Aspirin  81 mg  Health Maintenance  Topic Date Due   DTaP/Tdap/Td (1 - Tdap) Never done   Zoster Vaccines- Shingrix (1 of 2) Never done   COVID-19 Vaccine (3 - Pfizer risk series) 11/03/2019   Influenza Vaccine  03/21/2024   Medicare Annual Wellness (AWV)  04/22/2025   Colonoscopy  06/22/2027   Pneumococcal Vaccine: 50+ Years  Completed   Meningococcal B Vaccine  Aged Out   Hepatitis C Screening  Discontinued   Immunization History  Administered Date(s) Administered   Fluad Quad(high Dose 65+)  05/25/2022   Fluad Trivalent(High Dose 65+) 05/23/2023   INFLUENZA, HIGH DOSE SEASONAL PF 05/09/2018, 04/30/2019, 05/26/2021   Influenza Split 06/13/2011   Influenza,inj,Quad PF,6+ Mos 05/05/2015   Influenza-Unspecified 06/01/2014, 06/21/2017, 06/04/2020   PFIZER(Purple Top)SARS-COV-2 Vaccination 09/05/2019, 10/06/2019   Pneumococcal Conjugate-13 11/26/2013   Pneumococcal Polysaccharide-23 05/05/2015   Respiratory Syncytial Virus Vaccine,Recomb Aduvanted(Arexvy) 07/05/2022   Patient Active Problem List   Diagnosis Date Noted   History of CVA (cerebrovascular accident) without residual deficits 01/03/2020    Priority: High   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) 06/23/2019    Priority: High   Prostate cancer, in remission     Priority: High   Prediabetes 03/22/2022    Priority: Medium    Right bundle branch block     Priority: Medium    AV BLOCK, 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    Priority: Medium    Hyperlipidemia LDL goal <100 04/28/2010    Priority: Medium    ALLERGIC RHINITIS 04/28/2010   GERD 04/28/2010    Past Medical History:  Diagnosis Date   Allergic rhinitis due to pollen    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) 06/23/2019   GERD (gastroesophageal reflux disease)    Hyperlipidemia    Prediabetes 03/22/2022   Prostate cancer (HCC)    cancer    Past Surgical History:  Procedure Laterality Date   APPENDECTOMY     CARPAL TUNNEL RELEASE Bilateral    2023   HERNIA REPAIR  85,03   rt and lt ing hernia  KNEE ARTHROSCOPY  2012   left   LEFT HEART CATH AND CORONARY ANGIOGRAPHY N/A 05/30/2018   Procedure: LEFT HEART CATH AND CORONARY ANGIOGRAPHY;  Surgeon: Darron Deatrice LABOR, MD;  Location: [facility];  Service: Cardiovascular;  Laterality: N/A;   prostatectomy  1998   ROBOTIC ASSISTED LAPAROSCOPIC CHOLECYSTECTOMY  12/2019   ROTATOR CUFF REPAIR     right   SHOULDER ARTHROSCOPY Right 01/29/2013   Procedure: RIGHT  ARTHROSCOPY SHOULDER WITH DEBRIDEMENT, POSSIBLE ROTATOR CUFF REPAIR;  Surgeon: Dempsey JINNY Sensor, MD;  Location: [facility];  Service: Orthopedics;  Laterality: Right;   TONSILLECTOMY     TOTAL HIP ARTHROPLASTY  2007   right     Family History  Problem Relation Age of Onset   Cancer Father        prostate   Cancer Brother        prostate   Cancer Brother        prostate    Social History   Social History Narrative   Former competitive bodybuilder, Facilities manager, all natural   [employer], [employer], [employer], multiple years    Past Medical History, Surgical History, Social History, Family History, Problem List, Medications, and Allergies have been reviewed and updated if relevant.  Review of Systems: Pertinent positives are listed above.  Otherwise, a full 14 point review of systems has been done in full and it is negative except where it is noted positive.  Objective:   There were no vitals taken for this visit. Ideal Body Weight:    Ideal Body Weight:   No results found.    04/22/2024    8:26 AM 05/15/2023    8:31 AM 09/13/2022    2:30 PM 03/13/2022    8:17 AM 03/10/2021    2:09 PM  Depression screen PHQ 2/9  Decreased Interest 0 0 0 0 0  Down, Depressed, Hopeless 0 0 0 0 0  PHQ - 2 Score 0 0 0 0 0  Altered sleeping 0    0  Tired, decreased energy 0    0  Change in appetite 0    0  Feeling bad or failure about yourself  0    0  Trouble concentrating 0    0  Moving slowly or fidgety/restless 0    0  Suicidal thoughts 0    0  PHQ-9 Score 0    0  Difficult doing work/chores Not difficult at all    Not difficult at all     GEN: well developed, well nourished, no acute distress Eyes: conjunctiva and lids normal, PERRLA, EOMI ENT: TM clear, nares clear, oral exam WNL Neck: supple, no lymphadenopathy, no thyromegaly, no JVD Pulm: clear to auscultation and percussion, respiratory effort normal CV: regular rate and rhythm, S1-S2, no murmur, rub or  gallop, no bruits, peripheral pulses normal and symmetric, no cyanosis, clubbing, edema or varicosities GI: soft, non-tender; no hepatosplenomegaly, masses; active bowel sounds all quadrants GU: deferred Lymph: no cervical, axillary or inguinal adenopathy MSK: gait normal, muscle tone and strength WNL, no joint swelling, effusions, discoloration, crepitus  SKIN: clear, good turgor, color WNL, no rashes, lesions, or ulcerations Neuro: normal mental status, normal strength, sensation, and motion Psych: alert; oriented to person, place and time, normally interactive and not anxious or depressed in appearance.  All labs reviewed with patient. Results for orders placed or performed in visit on 05/28/24  Hepatic Function Panel   Collection Time: 05/28/24  7:30 AM  Result Value Ref Range   Total Bilirubin 1.1 0.2 - 1.2 mg/dL   Bilirubin, Direct 0.2 0.0 - 0.3 mg/dL   Alkaline Phosphatase 67 39 - 117 U/L   AST 23 0 - 37 U/L   ALT 20 0 - 53 U/L   Total Protein 6.5 6.0 - 8.3 g/dL   Albumin 4.4 3.5 - 5.2 g/dL  Basic metabolic panel   Collection Time: 05/28/24  7:30 AM  Result Value Ref Range   Sodium 139 135 - 145 mEq/L   Potassium 4.8 3.5 - 5.1 mEq/L   Chloride 105 96 - 112 mEq/L   CO2 28 19 - 32 mEq/L   Glucose, Bld 106 (H) 70 - 99 mg/dL   BUN 24 (H) 6 - 23 mg/dL   Creatinine, Ser 8.97 0.40 - 1.50 mg/dL   GFR 30.92 >39.99 mL/min   Calcium  9.6 8.4 - 10.5 mg/dL  PSA, Medicare   Collection Time: 05/28/24  7:30 AM  Result Value Ref Range   PSA 0.02 (L) 0.10 - 4.00 ng/ml  CBC with Differential/Platelet   Collection Time: 05/28/24  7:30 AM  Result Value Ref Range   WBC 4.8 4.0 - 10.5 K/uL   RBC 5.00 4.22 - 5.81 Mil/uL   Hemoglobin 14.8 13.0 - 17.0 g/dL   HCT 54.4 60.9 - 47.9 %   MCV 91.1 78.0 - 100.0 fl   MCHC 32.6 30.0 - 36.0 g/dL   RDW 85.4 88.4 - 84.4 %   Platelets 204.0 150.0 - 400.0 K/uL   Neutrophils Relative % 65.5 43.0 - 77.0 %   Lymphocytes Relative 20.2 12.0 - 46.0 %    Monocytes Relative 11.0 3.0 - 12.0 %   Eosinophils Relative 2.9 0.0 - 5.0 %   Basophils Relative 0.4 0.0 - 3.0 %   Neutro Abs 3.1 1.4 - 7.7 K/uL   Lymphs Abs 1.0 0.7 - 4.0 K/uL   Monocytes Absolute 0.5 0.1 - 1.0 K/uL   Eosinophils Absolute 0.1 0.0 - 0.7 K/uL   Basophils Absolute 0.0 0.0 - 0.1 K/uL  Lipid panel   Collection Time: 05/28/24  7:30 AM  Result Value Ref Range   Cholesterol 114 0 - 200 mg/dL   Triglycerides 36.9 0.0 - 149.0 mg/dL   HDL 61.49 (L) >60.99 mg/dL   VLDL 87.3 0.0 - 59.9 mg/dL   LDL Cholesterol 63 0 - 99 mg/dL   Total CHOL/HDL Ratio 3    NonHDL 75.45   Hemoglobin A1c   Collection Time: 05/28/24  7:30 AM  Result Value Ref Range   Hgb A1c MFr Bld 6.4 4.6 - 6.5 %    Assessment and Plan:     ICD-10-CM   1. Healthcare maintenance  Z00.00      Assessment & Plan   Health Maintenance Exam: The patient's preventative maintenance and recommended screening tests for an annual wellness exam were reviewed in full today. Brought up to date unless services declined.  Counselled on the importance of diet, exercise, and its role in overall health and mortality. The patient's FH and SH was reviewed, including their home life, tobacco status, and drug and alcohol status.  Follow-up in 1 year for physical exam or additional follow-up below.  Disposition: No follow-ups on file.  No orders of the defined types were placed in this encounter.  There are no discontinued medications. No orders of the defined types were placed in this encounter.   Signed,  Jacques DASEN. Madi Bonfiglio, MD   Allergies as of 06/02/2024  No Known Allergies      Medication List        Accurate as of May 31, 2024  8:18 AM. If you have any questions, ask your nurse or doctor.          acetaminophen  325 MG tablet Commonly known as: TYLENOL  Take 2 tablets (650 mg total) by mouth every 6 (six) hours as needed for mild pain (or temp > 37.5 C (99.5 F)).   aspirin  81 MG tablet Take 1  tablet (81 mg total) by mouth daily. Take aspirin  81 mg once daily by mouth for 3 weeks(21 days) and then stop   atorvastatin  40 MG tablet Commonly known as: LIPITOR TAKE 1 TABLET BY MOUTH DAILY AT 6PM   celecoxib 200 MG capsule Commonly known as: CELEBREX TAKE 1 CAPSULE BY MOUTH TWICE DAILY.   cyanocobalamin 2000 MCG tablet Take 2,000 mcg by mouth daily.   fluocinonide cream 0.05 % Commonly known as: LIDEX Apply 1 Application topically 2 (two) times daily.   ketoconazole  2 % cream Commonly known as: NIZORAL  Apply one application twice daily for two weeks   pantoprazole  40 MG tablet Commonly known as: PROTONIX  Take 1 tablet (40 mg total) by mouth 2 (two) times daily.   RED YEAST RICE PO Take 900 mg by mouth every morning.   vitamin E 180 MG (400 UNITS) capsule Take 400 Units by mouth daily.

## 2024-06-02 ENCOUNTER — Encounter: Payer: Self-pay | Admitting: Family Medicine

## 2024-06-02 ENCOUNTER — Ambulatory Visit: Admitting: Family Medicine

## 2024-06-02 VITALS — BP 120/86 | HR 75 | Temp 98.0°F | Ht 67.5 in | Wt 167.4 lb

## 2024-06-02 DIAGNOSIS — S41111A Laceration without foreign body of right upper arm, initial encounter: Secondary | ICD-10-CM

## 2024-06-02 DIAGNOSIS — Z0000 Encounter for general adult medical examination without abnormal findings: Secondary | ICD-10-CM

## 2024-06-02 DIAGNOSIS — Z23 Encounter for immunization: Secondary | ICD-10-CM

## 2024-06-05 ENCOUNTER — Other Ambulatory Visit: Payer: Self-pay | Admitting: Family Medicine

## 2024-06-05 NOTE — Telephone Encounter (Signed)
 Last office visit 06/02/2024 for CPE.  Last refilled 02/01/2024 for #60 with 2 refills.  Next Appt: No future appointments with PCP.

## 2024-06-10 DIAGNOSIS — M19032 Primary osteoarthritis, left wrist: Secondary | ICD-10-CM | POA: Diagnosis not present

## 2024-06-10 DIAGNOSIS — M19031 Primary osteoarthritis, right wrist: Secondary | ICD-10-CM | POA: Diagnosis not present

## 2024-06-24 DIAGNOSIS — M19011 Primary osteoarthritis, right shoulder: Secondary | ICD-10-CM | POA: Diagnosis not present

## 2024-06-24 DIAGNOSIS — M19012 Primary osteoarthritis, left shoulder: Secondary | ICD-10-CM | POA: Diagnosis not present

## 2024-06-25 ENCOUNTER — Other Ambulatory Visit: Payer: Self-pay | Admitting: Family Medicine

## 2024-07-03 DIAGNOSIS — Z85828 Personal history of other malignant neoplasm of skin: Secondary | ICD-10-CM | POA: Diagnosis not present

## 2024-07-03 DIAGNOSIS — L2089 Other atopic dermatitis: Secondary | ICD-10-CM | POA: Diagnosis not present

## 2024-09-16 ENCOUNTER — Other Ambulatory Visit: Payer: Self-pay | Admitting: Family Medicine

## 2024-09-16 NOTE — Telephone Encounter (Signed)
 Last office visit 06/02/24 for CPE.  Last refilled 06/05/24 for #60 with 2 refills.  Next appt: No future appointments with PCP.

## 2025-04-28 ENCOUNTER — Ambulatory Visit
